# Patient Record
Sex: Female | Born: 1970
Health system: Southern US, Community
[De-identification: ages and names within clinical notes are randomized; demographics above are authoritative.]

## PROBLEM LIST (undated history)

## (undated) ENCOUNTER — Emergency Department (HOSPITAL_COMMUNITY): Payer: Self-pay

## (undated) ENCOUNTER — Emergency Department (HOSPITAL_COMMUNITY): Admission: EM | Payer: Self-pay | Source: Home / Self Care

## (undated) DIAGNOSIS — T4145XA Adverse effect of unspecified anesthetic, initial encounter: Secondary | ICD-10-CM

## (undated) DIAGNOSIS — T8859XA Other complications of anesthesia, initial encounter: Secondary | ICD-10-CM

## (undated) DIAGNOSIS — F419 Anxiety disorder, unspecified: Secondary | ICD-10-CM

## (undated) DIAGNOSIS — I341 Nonrheumatic mitral (valve) prolapse: Secondary | ICD-10-CM

## (undated) DIAGNOSIS — Z87442 Personal history of urinary calculi: Secondary | ICD-10-CM

## (undated) DIAGNOSIS — G4730 Sleep apnea, unspecified: Secondary | ICD-10-CM

## (undated) DIAGNOSIS — F988 Other specified behavioral and emotional disorders with onset usually occurring in childhood and adolescence: Secondary | ICD-10-CM

## (undated) DIAGNOSIS — I499 Cardiac arrhythmia, unspecified: Secondary | ICD-10-CM

## (undated) DIAGNOSIS — N200 Calculus of kidney: Secondary | ICD-10-CM

## (undated) DIAGNOSIS — Z8616 Personal history of COVID-19: Secondary | ICD-10-CM

## (undated) DIAGNOSIS — Z8489 Family history of other specified conditions: Secondary | ICD-10-CM

## (undated) DIAGNOSIS — K219 Gastro-esophageal reflux disease without esophagitis: Secondary | ICD-10-CM

## (undated) DIAGNOSIS — Z9889 Other specified postprocedural states: Secondary | ICD-10-CM

## (undated) DIAGNOSIS — R112 Nausea with vomiting, unspecified: Secondary | ICD-10-CM

## (undated) DIAGNOSIS — M792 Neuralgia and neuritis, unspecified: Secondary | ICD-10-CM

## (undated) DIAGNOSIS — F32A Depression, unspecified: Secondary | ICD-10-CM

## (undated) HISTORY — PX: LUMBAR DISC SURGERY: SHX700

## (undated) HISTORY — DX: Other specified behavioral and emotional disorders with onset usually occurring in childhood and adolescence: F98.8

## (undated) HISTORY — DX: Calculus of kidney: N20.0

## (undated) HISTORY — DX: Nonrheumatic mitral (valve) prolapse: I34.1

## (undated) HISTORY — PX: DILATION AND CURETTAGE OF UTERUS: SHX78

## (undated) HISTORY — DX: Anxiety disorder, unspecified: F41.9

## (undated) HISTORY — PX: TONSILLECTOMY: SUR1361

## (undated) HISTORY — DX: Gastro-esophageal reflux disease without esophagitis: K21.9

---
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
## 1997-08-28 ENCOUNTER — Inpatient Hospital Stay (HOSPITAL_COMMUNITY): Admission: AD | Admit: 1997-08-28 | Discharge: 1997-08-31 | Payer: Self-pay | Admitting: Obstetrics & Gynecology

## 1997-10-03 ENCOUNTER — Encounter (HOSPITAL_COMMUNITY): Admission: RE | Admit: 1997-10-03 | Discharge: 1998-01-01 | Payer: Self-pay | Admitting: Obstetrics & Gynecology

## 1998-10-14 ENCOUNTER — Other Ambulatory Visit: Admission: RE | Admit: 1998-10-14 | Discharge: 1998-10-14 | Payer: Self-pay | Admitting: Obstetrics & Gynecology

## 1998-11-16 ENCOUNTER — Encounter: Payer: Self-pay | Admitting: Internal Medicine

## 1998-11-16 ENCOUNTER — Ambulatory Visit (HOSPITAL_COMMUNITY): Admission: RE | Admit: 1998-11-16 | Discharge: 1998-11-16 | Payer: Self-pay | Admitting: Internal Medicine

## 2000-03-01 ENCOUNTER — Other Ambulatory Visit: Admission: RE | Admit: 2000-03-01 | Discharge: 2000-03-01 | Payer: Self-pay | Admitting: Obstetrics & Gynecology

## 2000-08-31 ENCOUNTER — Inpatient Hospital Stay (HOSPITAL_COMMUNITY): Admission: AD | Admit: 2000-08-31 | Discharge: 2000-08-31 | Payer: Self-pay | Admitting: Obstetrics and Gynecology

## 2000-11-19 ENCOUNTER — Inpatient Hospital Stay (HOSPITAL_COMMUNITY): Admission: AD | Admit: 2000-11-19 | Discharge: 2000-11-19 | Payer: Self-pay | Admitting: Obstetrics and Gynecology

## 2001-01-24 ENCOUNTER — Inpatient Hospital Stay (HOSPITAL_COMMUNITY): Admission: AD | Admit: 2001-01-24 | Discharge: 2001-01-24 | Payer: Self-pay | Admitting: Obstetrics and Gynecology

## 2001-03-25 ENCOUNTER — Inpatient Hospital Stay (HOSPITAL_COMMUNITY): Admission: AD | Admit: 2001-03-25 | Discharge: 2001-03-27 | Payer: Self-pay | Admitting: Obstetrics & Gynecology

## 2001-03-29 ENCOUNTER — Encounter: Admission: RE | Admit: 2001-03-29 | Discharge: 2001-04-28 | Payer: Self-pay | Admitting: Obstetrics and Gynecology

## 2001-05-01 ENCOUNTER — Other Ambulatory Visit: Admission: RE | Admit: 2001-05-01 | Discharge: 2001-05-01 | Payer: Self-pay | Admitting: Obstetrics and Gynecology

## 2001-10-02 ENCOUNTER — Encounter: Payer: Self-pay | Admitting: Internal Medicine

## 2001-10-02 ENCOUNTER — Encounter: Admission: RE | Admit: 2001-10-02 | Discharge: 2001-10-02 | Payer: Self-pay | Admitting: Internal Medicine

## 2003-06-13 ENCOUNTER — Other Ambulatory Visit: Admission: RE | Admit: 2003-06-13 | Discharge: 2003-06-13 | Payer: Self-pay | Admitting: Obstetrics and Gynecology

## 2003-12-07 ENCOUNTER — Inpatient Hospital Stay (HOSPITAL_COMMUNITY): Admission: AD | Admit: 2003-12-07 | Discharge: 2003-12-07 | Payer: Self-pay | Admitting: Obstetrics & Gynecology

## 2004-02-05 ENCOUNTER — Inpatient Hospital Stay (HOSPITAL_COMMUNITY): Admission: AD | Admit: 2004-02-05 | Discharge: 2004-02-06 | Payer: Self-pay | Admitting: Obstetrics and Gynecology

## 2004-02-07 ENCOUNTER — Inpatient Hospital Stay (HOSPITAL_COMMUNITY): Admission: AD | Admit: 2004-02-07 | Discharge: 2004-02-09 | Payer: Self-pay | Admitting: Obstetrics and Gynecology

## 2004-08-04 ENCOUNTER — Other Ambulatory Visit: Admission: RE | Admit: 2004-08-04 | Discharge: 2004-08-04 | Payer: Self-pay | Admitting: Obstetrics & Gynecology

## 2004-08-13 ENCOUNTER — Ambulatory Visit: Payer: Self-pay | Admitting: Internal Medicine

## 2005-06-09 ENCOUNTER — Ambulatory Visit: Payer: Self-pay | Admitting: Internal Medicine

## 2005-07-11 HISTORY — PX: CERVICAL DISCECTOMY: SHX98

## 2005-07-14 ENCOUNTER — Ambulatory Visit: Payer: Self-pay | Admitting: Internal Medicine

## 2005-08-02 ENCOUNTER — Ambulatory Visit (HOSPITAL_BASED_OUTPATIENT_CLINIC_OR_DEPARTMENT_OTHER): Admission: RE | Admit: 2005-08-02 | Discharge: 2005-08-02 | Payer: Self-pay | Admitting: Internal Medicine

## 2005-08-31 ENCOUNTER — Ambulatory Visit: Payer: Self-pay | Admitting: Pulmonary Disease

## 2005-09-28 ENCOUNTER — Ambulatory Visit: Payer: Self-pay | Admitting: Pulmonary Disease

## 2005-10-11 ENCOUNTER — Ambulatory Visit (HOSPITAL_BASED_OUTPATIENT_CLINIC_OR_DEPARTMENT_OTHER): Admission: RE | Admit: 2005-10-11 | Discharge: 2005-10-11 | Payer: Self-pay | Admitting: Pulmonary Disease

## 2005-11-04 ENCOUNTER — Ambulatory Visit: Payer: Self-pay | Admitting: Pulmonary Disease

## 2006-01-21 ENCOUNTER — Emergency Department (HOSPITAL_COMMUNITY): Admission: EM | Admit: 2006-01-21 | Discharge: 2006-01-21 | Payer: Self-pay | Admitting: Emergency Medicine

## 2006-01-22 ENCOUNTER — Ambulatory Visit (HOSPITAL_COMMUNITY): Admission: RE | Admit: 2006-01-22 | Discharge: 2006-01-22 | Payer: Self-pay | Admitting: Emergency Medicine

## 2006-01-27 ENCOUNTER — Ambulatory Visit (HOSPITAL_COMMUNITY): Admission: RE | Admit: 2006-01-27 | Discharge: 2006-01-28 | Payer: Self-pay | Admitting: Neurosurgery

## 2006-03-02 ENCOUNTER — Encounter: Admission: RE | Admit: 2006-03-02 | Discharge: 2006-03-02 | Payer: Self-pay | Admitting: Neurosurgery

## 2006-06-02 ENCOUNTER — Ambulatory Visit: Payer: Self-pay | Admitting: Internal Medicine

## 2006-06-13 ENCOUNTER — Ambulatory Visit (HOSPITAL_COMMUNITY): Admission: RE | Admit: 2006-06-13 | Discharge: 2006-06-13 | Payer: Self-pay | Admitting: Neurosurgery

## 2006-06-13 ENCOUNTER — Encounter: Admission: RE | Admit: 2006-06-13 | Discharge: 2006-06-13 | Payer: Self-pay | Admitting: Neurosurgery

## 2006-12-20 ENCOUNTER — Ambulatory Visit: Payer: Self-pay | Admitting: Internal Medicine

## 2007-01-30 ENCOUNTER — Ambulatory Visit: Payer: Self-pay | Admitting: Internal Medicine

## 2007-03-26 ENCOUNTER — Ambulatory Visit (HOSPITAL_COMMUNITY): Admission: RE | Admit: 2007-03-26 | Discharge: 2007-03-26 | Payer: Self-pay | Admitting: Neurosurgery

## 2007-06-01 ENCOUNTER — Encounter: Payer: Self-pay | Admitting: Internal Medicine

## 2007-06-01 DIAGNOSIS — I059 Rheumatic mitral valve disease, unspecified: Secondary | ICD-10-CM | POA: Insufficient documentation

## 2007-06-08 ENCOUNTER — Encounter: Payer: Self-pay | Admitting: Internal Medicine

## 2008-03-11 ENCOUNTER — Encounter: Admission: RE | Admit: 2008-03-11 | Discharge: 2008-03-11 | Payer: Self-pay | Admitting: Neurosurgery

## 2008-09-23 ENCOUNTER — Telehealth (INDEPENDENT_AMBULATORY_CARE_PROVIDER_SITE_OTHER): Payer: Self-pay | Admitting: *Deleted

## 2008-09-24 ENCOUNTER — Ambulatory Visit: Payer: Self-pay | Admitting: Internal Medicine

## 2008-10-02 ENCOUNTER — Ambulatory Visit: Payer: Self-pay | Admitting: Internal Medicine

## 2008-10-02 ENCOUNTER — Telehealth: Payer: Self-pay | Admitting: Internal Medicine

## 2008-10-02 DIAGNOSIS — R002 Palpitations: Secondary | ICD-10-CM | POA: Insufficient documentation

## 2008-10-02 DIAGNOSIS — R9431 Abnormal electrocardiogram [ECG] [EKG]: Secondary | ICD-10-CM | POA: Insufficient documentation

## 2008-10-03 ENCOUNTER — Ambulatory Visit: Payer: Self-pay

## 2008-10-03 ENCOUNTER — Telehealth: Payer: Self-pay | Admitting: Internal Medicine

## 2008-10-17 ENCOUNTER — Encounter: Payer: Self-pay | Admitting: Internal Medicine

## 2008-11-01 ENCOUNTER — Encounter: Payer: Self-pay | Admitting: Internal Medicine

## 2008-12-05 ENCOUNTER — Ambulatory Visit: Payer: Self-pay | Admitting: Internal Medicine

## 2009-01-08 ENCOUNTER — Telehealth: Payer: Self-pay | Admitting: Internal Medicine

## 2009-01-23 ENCOUNTER — Ambulatory Visit: Payer: Self-pay | Admitting: Internal Medicine

## 2009-01-23 DIAGNOSIS — H66009 Acute suppurative otitis media without spontaneous rupture of ear drum, unspecified ear: Secondary | ICD-10-CM | POA: Insufficient documentation

## 2009-02-13 ENCOUNTER — Ambulatory Visit: Payer: Self-pay | Admitting: Internal Medicine

## 2009-02-13 DIAGNOSIS — H9209 Otalgia, unspecified ear: Secondary | ICD-10-CM | POA: Insufficient documentation

## 2009-02-16 ENCOUNTER — Telehealth: Payer: Self-pay | Admitting: Internal Medicine

## 2009-02-16 ENCOUNTER — Telehealth (INDEPENDENT_AMBULATORY_CARE_PROVIDER_SITE_OTHER): Payer: Self-pay | Admitting: *Deleted

## 2009-02-17 ENCOUNTER — Encounter: Payer: Self-pay | Admitting: Internal Medicine

## 2009-08-05 ENCOUNTER — Ambulatory Visit: Payer: Self-pay | Admitting: Internal Medicine

## 2009-08-05 ENCOUNTER — Telehealth: Payer: Self-pay | Admitting: Internal Medicine

## 2009-08-05 LAB — CONVERTED CEMR LAB
Bilirubin Urine: NEGATIVE
Glucose, Urine, Semiquant: NEGATIVE
Ketones, urine, test strip: NEGATIVE
Nitrite: NEGATIVE
Protein, U semiquant: NEGATIVE
Specific Gravity, Urine: 1.02
Urobilinogen, UA: 0.2
WBC Urine, dipstick: NEGATIVE
pH: 5

## 2009-08-08 ENCOUNTER — Encounter: Payer: Self-pay | Admitting: Internal Medicine

## 2009-08-10 ENCOUNTER — Telehealth: Payer: Self-pay | Admitting: Internal Medicine

## 2009-09-09 ENCOUNTER — Ambulatory Visit: Payer: Self-pay | Admitting: Internal Medicine

## 2009-09-09 ENCOUNTER — Telehealth: Payer: Self-pay | Admitting: Internal Medicine

## 2010-05-31 ENCOUNTER — Telehealth: Payer: Self-pay | Admitting: Internal Medicine

## 2010-06-02 ENCOUNTER — Ambulatory Visit: Payer: Self-pay | Admitting: Internal Medicine

## 2010-07-15 ENCOUNTER — Encounter
Admission: RE | Admit: 2010-07-15 | Discharge: 2010-07-15 | Payer: Self-pay | Source: Home / Self Care | Attending: Neurosurgery | Admitting: Neurosurgery

## 2010-08-01 ENCOUNTER — Encounter: Payer: Self-pay | Admitting: Obstetrics and Gynecology

## 2010-08-12 NOTE — Assessment & Plan Note (Signed)
Summary: possible strep throat-norins-lb   Vital Signs:  Patient profile:   40 year old female Height:      68.5 inches Weight:      147 pounds BMI:     22.11 O2 Sat:      98 % on Room air Temp:     98.5 degrees F oral Pulse rate:   96 / minute BP sitting:   130 / 80  (left arm) Cuff size:   regular  Vitals Entered ByZella Ball Ewing (September 09, 2009 4:54 PM)  O2 Flow:  Room air CC: sore throat, right ear discomfort/RE   Primary Care Provider:  Jacques Navy MD  CC:  sore throat and right ear discomfort/RE.  History of Present Illness: here with 3 days fever and severe ST, fatigue and malaise and headache,  no sinus congestion , pain , cough and Pt denies CP, sob, doe, wheezing, orthopnea, pnd, worsening LE edema, palps, dizziness or syncope   Pt denies new neuro symptoms such as headache, facial or extremity weakness   Problems Prior to Update: 1)  Pharyngitis-acute  (ICD-462) 2)  Ear Pain, Right  (ICD-388.70) 3)  Acut Suppratv Otitis Media w/o Spont Rup Eardrum  (ICD-382.00) 4)  Electrocardiogram, Abnormal  (ICD-794.31) 5)  Palpitations, Occasional  (ICD-785.1) 6)  Mitral Valve Prolapse  (ICD-424.0)  Medications Prior to Update: 1)  Tylenol Extra Strength 500 Mg Tabs (Acetaminophen) .... As Needed For Fever 2)  Lactaid 3000 Unit Tabs (Lactase) .... As Needed When Eatting Dairy 3)  Promethazine-Codeine 6.25-10 Mg/73ml Syrp (Promethazine-Codeine) .Marland Kitchen.. 1 Tsp Q 6 For Cough 4)  Sulfamethoxazole-Trimethoprim 800-160 Mg/6ml Susp (Sulfamethoxazole-Trimethoprim) .... 20cc By Mouth Two Times A Day X 7 Days  Current Medications (verified): 1)  Tylenol Extra Strength 500 Mg Tabs (Acetaminophen) .... As Needed For Fever 2)  Lactaid 3000 Unit Tabs (Lactase) .... As Needed When Eatting Dairy 3)  Azithromycin 250 Mg Tabs (Azithromycin) .... 2po Qd For 1 Day, Then 1po Qd For 4days, Then Stop  Allergies (verified): 1)  ! Demerol 2)  ! Codeine  Past History:  Past Medical  History: Last updated: 08/05/2009 MITRAL VALVE PROLAPSE (ICD-424.0)- no 2D echo evidence   OSA  Past Surgical History: Last updated: 08/05/2009 Tonsillectomy L5-S1 ruptured disk with fragments - diskectomy '91 ([place]) Cervical diskectomy - anterior approach '07 ( Dr. 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  , mild ill  Head:  normocephalic and atraumatic.   Eyes:  vision grossly intact, pupils equal, and pupils round.   Ears:  bilat tm's red, sinus nontender Nose:  nasal dischargemucosal pallor and mucosal edema.   Mouth:  pharyngeal erythema, fair dentition, and pharyngeal exudate.   Neck:  supple and cervical lymphadenopathy.   Lungs:  normal respiratory effort and normal breath sounds.   Heart:  normal rate and regular rhythm.   Extremities:  no edema, no erythema    Impression & Recommendations:  Problem # 1:  PHARYNGITIS-ACUTE (ICD-462)  Her updated medication list for this problem includes:    Tylenol Extra Strength 500 Mg  Tabs (Acetaminophen) .Marland Kitchen... As needed for fever    Azithromycin 250 Mg Tabs (Azithromycin) .Marland Kitchen... 2po qd for 1 day, then 1po qd for 4days, then stop treat as above, f/u any worsening signs or symptoms   Complete Medication List: 1)  Tylenol Extra Strength 500 Mg Tabs (Acetaminophen) .... As needed for fever 2)  Lactaid 3000 Unit Tabs (Lactase) .... As needed when eatting dairy 3)  Azithromycin 250 Mg Tabs (Azithromycin) .... 2po qd for 1 day, then 1po qd for 4days, then stop  Patient Instructions: 1)  Please take all  new medications as prescribed  2)  Continue all previous medications as before this visit  3)  Please schedule a follow-up appointment as needed. Prescriptions: AZITHROMYCIN 250 MG TABS (AZITHROMYCIN) 2po qd for 1 day, then 1po qd for 4days, then stop  #6 x 1   Entered and Authorized by:   Corwin Levins MD   Signed by:   Corwin Levins MD on 09/09/2009   Method used:   Print then Give to Patient   RxID:   [id number]

## 2010-08-12 NOTE — Progress Notes (Signed)
Summary: rx  Phone Note Call from Patient Call back at Work Phone [phone]   Summary of Call: Patient called to inquire why pharmacy has not rec'd prescription for antibiotic from office visit this AM. RX was not listed as signed, so I signed prescription so that it can be filled for the patient. Patient notified. Initial call taken by: Lucious Groves,  August 05, 2009 3:45 PM

## 2010-08-12 NOTE — Letter (Signed)
Summary: Call A Nurse  Call A Nurse   Imported By: Sherian Rein 08/12/2009 09:48:55  _____________________________________________________________________  External Attachment:    Type:   Image     Comment:   External Document

## 2010-08-12 NOTE — Progress Notes (Signed)
Summary: TETANUS  Phone Note Call from Patient Call back at Home Phone [phone] Call back at Work Phone [phone]   Summary of Call: Patient is requesting to know date of last tetanus. She punctured her finger with an arrow. Chart ordered.  Initial call taken by: Lamar Sprinkles, CMA,  May 31, 2010 3:32 PM  Follow-up for Phone Call        Informed pt that I was waiting on chart to confirm. Advised her to keep wound clean, dry and bandaged. Call office w/redness, increased pain, drainage, odor or any concerning symptoms. She agreed. Pt feels that she had tetanus vacc in our office around 2008 but is not completely sure - Will waitin on chart.  Follow-up by: Lamar Sprinkles, CMA,  May 31, 2010 6:40 PM  Additional Follow-up for Phone Call Additional follow up Details #1::        No tetanus in the last 10 years, Pt informed, scheduled for n/v tomorrow for TDAP Additional Follow-up by: Lamar Sprinkles, CMA,  June 01, 2010 5:41 PM

## 2010-08-12 NOTE — Progress Notes (Signed)
Summary: CALL A NURSE  Phone Note From Other Clinic   Summary of Call: CALL A NURSE: Pt called saturday for office visit, c/o chest congestion & cough. No fever. Advised by RN to go to [facility] b/c sat clinic was full.  Initial call taken by: Lamar Sprinkles, CMA,  August 10, 2009 5:52 PM

## 2010-08-12 NOTE — Assessment & Plan Note (Signed)
Summary: TDAP/SD  Nurse Visit   Allergies: 1)  ! Demerol 2)  ! Codeine  Immunizations Administered:  Tetanus Vaccine:    Vaccine Type: Tdap    Site: right deltoid    Mfr: GlaxoSmithKline    Dose: 0.5 ml    Route: IM    Given by: Ami Bullins CMA    Exp. Date: 04/29/2012    Lot #: [id number]    VIS given: 05/28/08 version given June 02, 2010.  Orders Added: 1)  Tdap => 53yrs IM [90715] 2)  Admin 1st Vaccine [96295]

## 2010-08-12 NOTE — Progress Notes (Signed)
Summary: Possible strep  Phone Note Call from Patient   Summary of Call: Patient left message on triage that she would like to come in for lab or something so that she can have throat swab/culture done to check for strep. Per the patient her children have had it, and now she has sore throat, and felt like a rock in her throat. Patient was advised that an office visit is needed and transferred to schedule. Initial call taken by: Lucious Groves,  September 09, 2009 2:45 PM

## 2010-08-12 NOTE — Assessment & Plan Note (Signed)
Summary: cough,cold/#/cd   Vital Signs:  Patient profile:   40 year old female Height:      69 inches Weight:      143 pounds BMI:     21.19 O2 Sat:      96 % on Room air Temp:     98.6 degrees F oral Pulse rate:   105 / minute BP sitting:   108 / 82  (left arm) Cuff size:   regular  Vitals Entered By: Bill Salinas CMA (August 05, 2009 10:58 AM)  O2 Flow:  Room air CC: pt here with complaint of slight sore throat with aching and fever. she also c/o of lower back pain and burning when urinating/ pt had rapid strep done last night which was neg/ ab   Primary Care Provider:  Jacques Navy MD  CC:  pt here with complaint of slight sore throat with aching and fever. she also c/o of lower back pain and burning when urinating/ pt had rapid strep done last night which was neg/ ab.  History of Present Illness: Cough, right ear pain, right throat pain. Yesterday had severe myalgias and fever to 102. Tight achy chest and feels like respiratory infection is developing.  She also has had low back/flank pain and dysuria.   Current Medications (verified): 1)  Tylenol Extra Strength 500 Mg Tabs (Acetaminophen) .... As Needed For Fever 2)  Lactaid 3000 Unit Tabs (Lactase) .... As Needed When [employer]  Allergies (verified): 1)  ! Demerol 2)  ! Codeine  Past History:  Past Medical History: MITRAL VALVE PROLAPSE (ICD-424.0)- no 2D echo evidence   OSA  Past Surgical History: Tonsillectomy L5-S1 ruptured disk with fragments - diskectomy '91 ([place]) Cervical diskectomy - anterior approach '07 ( Dr. 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   Head:  normocephalic and atraumatic.   Eyes:  pupils equal, pupils round, corneas and lenses clear, and no injection.   Ears:  R ear normal and L ear normal.   Nose:  no external deformity and no external erythema.   Mouth:  no oral lesions, posterior pharynx clear Neck:  supple, full ROM, and no thyromegaly.   Lungs:  normal respiratory effort, normal breath sounds, no crackles, and no wheezes.   Heart:  normal rate, regular rhythm, and no murmur.   Abdomen:  soft, normal bowel sounds, no masses, no guarding, and no hepatomegaly.  Tender to palpation in the suprapubic region Msk:  normal ROM, no joint tenderness, no joint swelling, and no joint deformities.   Pulses:  2+ radial pulses Neurologic:  alert & oriented X3, cranial nerves II-XII intact, and gait normal.   Skin:  turgor normal, color normal, and no rashes.   Cervical Nodes:  no anterior cervical adenopathy and no posterior cervical adenopathy.   Psych:  Oriented X3, memory intact for recent and remote, and good eye contact.     Impression & Recommendations:  Problem # 1:  EAR PAIN, RIGHT (ICD-388.70) No evidence of bacterial respiratory infection. Suspect viral URI.  Plan - supportive care incluidng  promethazine/cod at bedtime.  The following medications were removed from the medication list:    Ciprodex 0.3-0.1 % Susp (Ciprofloxacin-dexamethasone) .Marland KitchenMarland KitchenMarland KitchenMarland Kitchen 4 qtts each ear two times a day x 7 days    Augmentin 250-62.5 Mg/109ml Susr (Amoxicillin-pot clavulanate) .Marland KitchenMarland KitchenMarland KitchenMarland Kitchen 10 ml by mouth three times a day for 10 days Her updated medication list for this problem includes:    Sulfamethoxazole-trimethoprim 800-160 Mg/3ml Susp (Sulfamethoxazole-trimethoprim) .Marland Kitchen... 20cc by mouth two times a day x 7 days  Problem # 2:  CYSTITIS, ACUTE (ICD-595.0) U/A with LE or bacteria but patient is tender to exam  PlaN TMP/SMX DS  as liquid two times a day x 7 days.  The following medications were removed from the medication list:    Augmentin 250-62.5 Mg/88ml Susr (Amoxicillin-pot clavulanate) .Marland KitchenMarland KitchenMarland KitchenMarland Kitchen 10 ml by mouth three times a day for 10 days Her updated medication list for this problem includes:    Sulfamethoxazole-trimethoprim 800-160 Mg/27ml Susp (Sulfamethoxazole-trimethoprim) .Marland Kitchen... 20cc by mouth two times a day x 7 days  Problem # 3:  MITRAL VALVE PROLAPSE (ICD-424.0) No hard evidence of MVR.   Complete Medication List: 1)  Tylenol Extra Strength 500 Mg Tabs (Acetaminophen) .... As needed for fever 2)  Lactaid 3000 Unit Tabs (Lactase) .... As needed when eatting dairy 3)  Promethazine-codeine 6.25-10 Mg/61ml Syrp (Promethazine-codeine) .Marland Kitchen.. 1 tsp q 6 for cough 4)  Sulfamethoxazole-trimethoprim 800-160 Mg/30ml Susp (Sulfamethoxazole-trimethoprim) .... 20cc by mouth two times a day x 7 days  Patient Instructions: 1)  Upper respiratory infection - most likely viral. Ear pain may be coming from eustachian tube dysfunction. Plan - tylenol for fever and aches; robitussin DM or the equivalent 1 tsp every 6 hours for cough; pseudoephedrine 30mg  three times a day for congestion and ear pain; see below. For night time cough - promethazine with codeine syrup - 1 or 2 tsp. 2)  Bladder infection - burning and tenderness. Plan - TMP/SMX DS as liguid 20 cc (4 tsp) two times a day for 7 days. Hydrate.  Prescriptions: SULFAMETHOXAZOLE-TRIMETHOPRIM 800-160 MG/20ML SUSP (SULFAMETHOXAZOLE-TRIMETHOPRIM) 20cc by mouth two times a day x 7 days  #280cc x 0   Entered and Authorized by:   Jacques Navy MD   Signed by:   Lucious Groves on 08/05/2009   Method used:   Electronically to        [facility]  [address] [id number] ([facility])       [address]       [place], [place]  [address]       Ph: [phone] or [phone]       Fax: [phone]   RxID:   [id number] PROMETHAZINE-CODEINE 6.25-10 MG/5ML SYRP  (PROMETHAZINE-CODEINE) 1 tsp q 6 for cough  #8 oz x 1   Entered and Authorized by:   Jacques Navy MD   Signed by:   Jacques Navy MD on 08/05/2009   Method used:   Print then Give to Patient   RxID:   [id number]   Laboratory Results   Urine Tests   Date/Time Reported: 08/05/2009 11:40am  Routine Urinalysis   Color: orange Appearance: Hazy Glucose: negative   (Normal Range: Negative) Bilirubin: negative   (Normal Range: Negative) Ketone: negative   (Normal Range: Negative) Spec. Gravity: 1.020   (Normal Range: 1.003-1.035) Blood: trace-lysed   (Normal Range: Negative) pH: 5.0   (Normal Range: 5.0-8.0) Protein: negative   (Normal Range: Negative) Urobilinogen: 0.2   (Normal Range: 0-1) Nitrite: negative   (  Normal Range: Negative) Leukocyte Esterace: negative   (Normal Range: Negative)

## 2010-08-12 NOTE — Assessment & Plan Note (Signed)
Summary: cough,congestion/men/cd   Vital Signs:  Patient profile:   40 year old female Height:      69 inches Weight:      146 pounds BMI:     21.64 O2 Sat:      99 % Temp:     97.9 degrees F oral Pulse rate:   105 / minute Pulse rhythm:   regular BP sitting:   128 / 82  (left arm) Cuff size:   regular  Vitals Entered By: Rock Nephew CMA (September 24, 2008 10:04 AM) Is Patient Diabetic? No Pain Assessment Patient in pain? no        Primary Care Provider:  Jacques Navy MD   History of Present Illness: I am seeing this pt. for the first time today as she complains of a  5 day hx. of myalgias, fever to 100, NP cough, bilateral ear popping, and post-tussive headache.  Preventive Screening-Counseling & Management     Alcohol drinks/day: 0     Smoking Status: never     Does Patient Exercise: yes      Drug Use:  no.    Problems Prior to Update: 1)  Mitral Valve Prolapse  (ICD-424.0)  Medications Prior to Update: 1)  Levbid 0.375 Mg Tb12 (Hyoscyamine Sulfate) .... Take 1 Tablet By Mouth Twice A Day 2)  Levsin/sl 0.125 Mg  Subl (Hyoscyamine Sulfate) .Marland Kitchen.. 1 By Mouth As Needed 3)  Flexeril 5 Mg  Tabs (Cyclobenzaprine Hcl) .... As Needed 4)  Multivitamins   Tabs (Multiple Vitamin) .Marland Kitchen.. 1 By Mouth Once Daily  Current Medications (verified): 1)  None  Allergies (verified): 1)  ! Demerol 2)  ! Codeine  Past History:  Risk Factors:    Alcohol Use: N/A    >5 drinks/d w/in last 3 months: N/A    Caffeine Use: N/A    Diet: N/A    Exercise: N/A  Past medical, surgical, family and social histories (including risk factors) reviewed, and no changes noted (except as noted below).  Past Medical History:    Reviewed history from 06/01/2007 and no changes required:    MITRAL VALVE PROLAPSE (ICD-424.0)       Past Surgical History:    Denies surgical history  Family History:    Reviewed history and no changes required:  Social History:    Reviewed history and no  changes required:       Married       Never Smoked       Alcohol use-no       Drug use-no       Regular exercise-yes    Drug Use:  no    Does Patient Exercise:  yes  Review of Systems       The patient complains of fever, prolonged cough, and headaches.  The patient denies anorexia, weight loss, weight gain, hoarseness, chest pain, syncope, dyspnea on exertion, peripheral edema, hemoptysis, abdominal pain, difficulty walking, depression, enlarged lymph nodes, and angioedema.    Physical Exam  General:  alert, well-developed, well-nourished, well-hydrated, appropriate dress, normal appearance, healthy-appearing, and cooperative to examination.   Ears:  External ear exam shows no significant lesions or deformities.  Otoscopic examination reveals clear canals, tympanic membranes are intact bilaterally without bulging, retraction, inflammation or discharge. Hearing is grossly normal bilaterally. Nose:  External nasal examination shows no deformity or inflammation. Nasal mucosa are pink and moist without lesions or exudates. Mouth:  Oral mucosa and oropharynx without lesions or exudates.  Teeth in good  repair. Neck:  supple, full ROM, no masses, and no thyromegaly.   Lungs:  Normal respiratory effort, chest expands symmetrically. Lungs are clear to auscultation, no crackles or wheezes. Heart:  Normal rate and regular rhythm. S1 and S2 normal without gallop, murmur, click, rub or other extra sounds. Abdomen:  soft, non-tender, normal bowel sounds, no distention, no masses, no hepatomegaly, and no splenomegaly.   Msk:  No deformity or scoliosis noted of thoracic or lumbar spine.   Pulses:  R and L carotid,radial,femoral,dorsalis pedis and posterior tibial pulses are full and equal bilaterally Extremities:  No clubbing, cyanosis, edema, or deformity noted with normal full range of motion of all joints.   Skin:  turgor normal, color normal, no rashes, no suspicious lesions, no ulcerations, and no  edema.   Cervical Nodes:  No lymphadenopathy noted Axillary Nodes:  No palpable lymphadenopathy Psych:  Cognition and judgment appear intact. Alert and cooperative with normal attention span and concentration. No apparent delusions, illusions, hallucinations   Impression & Recommendations:  Problem # 1:  BRONCHITIS-ACUTE (ICD-466.0) Assessment New  Her updated medication list for this problem includes:    Zithromax 100 Mg/45ml Recon Sus (Azithromycin) .Marland Kitchen... Take 10 ml by mouth day one, then 5 ml by mouth a day for four (4) more days    Tussionex Pennkinetic Er 8-10 Mg/64ml Lqcr (Chlorpheniramine-hydrocodone) .Marland KitchenMarland KitchenMarland KitchenMarland Kitchen 5 ml by mouth two times a day as needed cough  Complete Medication List: 1)  Zithromax 100 Mg/11ml Recon Sus (Azithromycin) .... Take 10 ml by mouth day one, then 5 ml by mouth a day for four (4) more days 2)  Tussionex Pennkinetic Er 8-10 Mg/67ml Lqcr (Chlorpheniramine-hydrocodone) .... 5 ml by mouth two times a day as needed cough  Patient Instructions: 1)  Please schedule a follow-up appointment in 2 weeks. 2)  Take your antibiotic as prescribed until ALL of it is gone, but stop if you develop a rash or swelling and contact our office as soon as possible. 3)  Acute bronchitis symptoms for less than 10 days are not helped by antibiotics. take over the counter cough medications. call if no improvment in  5-7 days, sooner if increasing cough, fever, or new symptoms( shortness of breath, chest pain). Prescriptions: TUSSIONEX PENNKINETIC ER 8-10 MG/5ML LQCR (CHLORPHENIRAMINE-HYDROCODONE) 5 ml by mouth two times a day as needed cough  #4 ozs. x 0   Entered and Authorized by:   Etta Grandchild MD   Signed by:   Etta Grandchild MD on 09/24/2008   Method used:   Print then Give to Patient   RxID:   [id number] ZITHROMAX 100 MG/5ML RECON SUS (AZITHROMYCIN) Take 10 ml by mouth day one, then 5 ml by mouth a day for four (4) more days  #30 ml x 0   Entered and Authorized by:   Etta Grandchild MD   Signed by:   Etta Grandchild MD on 09/24/2008   Method used:   Print then Give to Patient   RxID:   [id number]

## 2010-11-26 NOTE — Op Note (Signed)
NAME:  Shannon Swanson, Shannon Swanson NO.:  [id number]   MEDICAL RECORD NO.:  [id number]          PATIENT TYPE:  OIB   LOCATION:  3010                         FACILITY:  [facility]   PHYSICIAN:  Donalee Citrin, M.D.        DATE OF BIRTH:  [birth year]   DATE OF PROCEDURE:  01/27/2006  DATE OF DISCHARGE:                                 OPERATIVE REPORT   PREOPERATIVE DIAGNOSES:  Left C6 radiculopathy from __________ C5-6 level.   PROCEDURE:  Anterior cervical diskectomy and fusion C5-6 using a 6 mm ACS  graft and a 23 mm Atlantis plate with two 13 and two 12 mm __________  screws.   SURGEON:  Donalee Citrin, M.D.   Threasa HeadsYetta Barre.   ANESTHESIA:  General endotracheal.   HISTORY OF PRESENT ILLNESS:  The patient is a very pleasant, 40 year old  female who has had progressive, unrelenting neck and left arm pain that has  gotten progressively worse over the last several weeks and months.  She had  weakness of her triceps, inability to use her arm and numbness in a C6  distribution.  A preoperative EMG showed a very large disk herniation at C5-  6 compressing on the spinal cord and left C6 nerve root.  Due to the  patient's preoperative weakness and findings on physical exam and MRI, the  patient is recommended to have cervical diskectomy and fusion.  Risks are  explained to the patient, and she understands and agreed to proceed forward.   The patient was brought to the OR was induced under general anesthesia,  positioned supine with the neck in extension with 5 pounds halter traction.  The right side of the neck was prepped and draped in the usual sterile  fashion.  Preoperative x-rays localized to C6 vertebral body.  A curvilinear  incision was made just off the midline to the anterior border of  sternocleidomastoid.  The superficial layer of fascia was dissected out and  divided longitudinally.  The avascular __________ sternocleidomastoid strap  muscle was divided __________.   Intraoperative x-ray confirmed mobilization  to the appropriate level.  Annulotomy was then obtained with scalpel to mark  the disk space and the longus coli was reflected laterally.  Then, a self-  retaining retractor was placed and the annulotomy was extended.  Then,  pituitary rongeurs were used to clean out the disk space, then using a  pituitary and Kerrison punch, the anterior osteophyte of the C5 vertebral  body was underbitten and a high speed drill was used to drill out the  posterior annulus and the posterior osteophyte complex.  At this point, the  operating microscope was draped, brought into the field and under  microscopic illumination, the undersurface of the endplates at C5 and C6  were underbitten.  This exposed the __________ which removed in a piecemeal  fashion, which exposed several very large fragments of disk compressing the  spinal cord and proximal left C6 nerve root that had migrated subligamentous  and displaced the spinal cord dorsally.  These were teased away with a black  nerve hook and removed  with pituitary rongeurs.  This decompressed the  thecal sac immediately.  The foraminotomy was extended.  The C6 nerve root  on the left side was identified and skeletonized significantly proximally  and distally.  Then, a pedicle at C6 was also palpated and identified.  Then, the undersurface of the end plates was underbitten, marching across  the right side.  There was a reasonably-sized osteophyte at the posterior  aspect of the C5 vertebral body still compressing the spinal cord.  This was  all underbitten and the proximal C6 nerve root on the right was also  identified and skeletonized.  After the thecal sac had resumed normal  anatomic location, the would was copiously irrigated and meticulous  hemostasis was maintained.  The endplates were scraped with a BA curette and  a 6 mm allograft wedge was inserted 1 mm deep to the anterior vertebral  line. A 23 mm Atlantis  Vision plate was inserted.  Two 13 mm __________  screws were placed.  Fluoroscopy confirmed good position of plate, screws  and bone graft.  The 13-mm screws were noted to be abutting the posterior  cortical surface and so the next two screws put in were 12s.  Then, the  locking mechanism was engaged.  The wound was copiously irrigated and  meticulous hemostasis was maintained.  The platysma was reapproximated with  3-0 interrupted Vicryl and the skin was closed with running 4-0  subcuticular.  Benzoin and Steri-Strips were applied and the patient went to  the recovery in stable condition.  At the end of the case, needle count and  sponge count were correct.           ______________________________  Donalee Citrin, M.D.     GC/MEDQ  D:  01/27/2006  T:  01/27/2006  Job:  [id number]

## 2010-11-26 NOTE — Procedures (Signed)
NAME:  Shannon Swanson, Shannon Swanson                 ACCOUNT NO.:  0   MEDICAL RECORD NO.:  [id number]           PATIENT TYPE:   LOCATION:  SLEEP CENTER                   FACILITY:   PHYSICIAN:  Coralyn Helling, M.D.      DATE OF BIRTH:  21-Apr-1971   DATE OF STUDY:                            MULTIPLE SLEEP LATENCY TEST   DATE OF STUDY:  10/12/2005   INDICATION FOR STUDY:  This is an individual who has complaints of excessive  and daytime sleepiness.  She had undergone an overnight polysomnogram on  October 11, 2005, which documented greater than 6 hours of total sleep time and  she is now to undergo multiple sleep latency testing.   EPWORTH SLEEPINESS SCORE:  21   BMI:  20.9   MEDICATIONS:  Levsin, which she did not take on this date.   Nap Times:              Sleep Latency:                REM Latency:  1)  0800                      3.5 minutes                   No sleep onset  REM  2)  1000                      3.5 minutes                   No sleep onset  REM  3)  1200                      1.5 minutes                   No sleep onset  REM  4)  1400                      7 minutes                     No sleep onset  REM  5)  1600                      9 minutes                     No sleep onset  REM    MEAN SLEEP LATENCY:  4.9 minutes   NUMBER OF REM EPISODES:  0   IMPRESSIONS-RECOMMENDATIONS:  This study shows evidence for a significant  hypersomnolence and correlation should be made with the patient's prior  overnight polysomnogram.      Coralyn Helling, M.D.  Diplomat, Biomedical engineer of Sleep Medicine  Electronically Signed     VS/MEDQ  D:  11/03/2005 12:30:55  T:  11/04/2005 07:48:31  Job:  [id number]

## 2010-11-26 NOTE — Procedures (Signed)
NAME:  Shannon Swanson, Shannon Swanson NO.:  [id number]   MEDICAL RECORD NO.:  [id number]          PATIENT TYPE:  OUT   LOCATION:  SLEEP CENTER                 FACILITY:  [facility]   PHYSICIAN:  Coralyn Helling, M.D.      DATE OF BIRTH:  1970-12-29   DATE OF STUDY:  08/02/2005                              NOCTURNAL POLYSOMNOGRAM   DATE OF STUDY:  August 02, 2005.   REFERRING PHYSICIAN:  Rosalyn Gess. Norins, M.D. [facility]   INDICATION FOR STUDY:  Patient waking up fatigued, with a choking sensation,  feeling something is blocking her throat, and sleepy during the day. She is  referred to the sleep lab for evaluation of sleep disordered breathing.   EPWORTH SLEEPINESS SCORE:  18.   MEDICATIONS:  Levsin.   SLEEP ARCHITECTURE:  Total recording time is 439 minutes. Total sleep time  was 418.5 minutes.  Sleep efficiency was 95%. The patient was observed in  all stages of sleep, although there was a relative reduction in the  percentage of REM sleep and the slow wave sleep, and increase in percentage  of stage II sleep. Sleep latency was 14 minutes, which was within normal  limits. REM latency was 96 minutes, which was within normal limits. The  patient was observed in both supine and non-supine position. Also of note,  an alpha/delta pattern on EEG and there was a brief period of delta  hypersynchrony at sleep onset.   RESPIRATORY DATA:  The apnea/hypopnea index was 3.2 with an apnea/hypopnea  index during REM sleep of 8.9, and mild to moderate snoring was noted by the  technician. There was truncation of the air flow.   OXYGEN DATA:  The oxygen saturation nadir was 92%. The patient spent the  entire study with an oxygen saturation between 91% to 100%.   CARDIAC DATA:  EKG showed normal sinus rhythm with occasional APCs.   MOVEMENT-PARASOMNIA:  Periodic movement index was 10.9 which is normal. No  other abnormal movements were noted.   IMPRESSIONS-RECOMMENDATIONS:  This study does not  show evidence for sleep  disordered breathing as the apnea/hypopnea index was for the entire study  was 3.2, with an oxygen saturation nadir of 92%. However, the patient did  have predominance of respiratory events during REM sleep and may therefore  have REM-related sleep disordered breathing. Additionally, she had  truncation of her air flow associated with occasional arousals. While these  events were not scoreable based on typical criteria, they may be reflective  of upper airway resistance syndrome and if clinically warranted the patient  may benefit from further therapy such as CPAP therapy for possible upper  airway resistance syndrome. Of additional note is that she had an  alpha/delta pattern during slow wave sleep. While this can be a normal  finding, it has also been associated with other conditions such as  fibromyalgia and clinical correlation would be necessary. Additionally, she  had a brief period of delta hypersynchrony with sleep onset. Again this can  be a normal finding. However, clinical correlation would be necessary for  any underlying neurologic disorder.      Coralyn Helling, M.D.  Diplomat, [employer]  Board of Sleep [employer]  Electronically Signed     VS/MEDQ  D:  08/31/2005 17:34:20  T:  09/01/2005 15:07:48  Job:  [id number]

## 2010-11-26 NOTE — Procedures (Signed)
NAME:  Shannon Swanson, Shannon Swanson NO.:  [id number]   MEDICAL RECORD NO.:  [id number]          PATIENT TYPE:  OUT   LOCATION:  SLEEP CENTER                 FACILITY:  [facility]   PHYSICIAN:  Coralyn Helling, M.D.      DATE OF BIRTH:  07-11-1971   DATE OF STUDY:  10/11/2005                            MULTIPLE SLEEP LATENCY TEST   INDICATION FOR STUDY:  This is an individual who has complaints of excess in  daytime sleepiness.  She had undergone a overnight polysomnogram on October 11, 2005, which documented greater than 6 hours of total sleep time and she is  now to undergo multiple sleep latency testing.   EPWORTH SLEEPINESS SCORE:  21   BMI:  20.9   MEDICATIONS:  Levsin which she did not take on this day.   Nap Times:              Sleep Latency:                REM Latency:  1)  0800                      3.5 minutes                   No sleep onset  REM  2)  1000                      3.5 minutes                   No sleep onset  REM  3)  1200                      1.5 minutes                   No sleep onset  REM  4)  1400                      7 minutes                     No sleep onset  REM  5)  1600                      9 minutes                     No sleep onset  REM    MEAN SLEEP LATENCY:  4.9 minutes   NUMBER OF REM EPISODES:  0   COMMENTS:   IMPRESSIONS-RECOMMENDATIONS:  This study shows evidence for significant  hypersomnolence and correlation should be made with the patient's prior  overnight polysomnogram.      Coralyn Helling, M.D.  Diplomat, Biomedical engineer of Sleep Medicine  Electronically Signed     VS/MEDQ  D:  10/31/2005 20:14:48  T:  11/01/2005 20:01:27  Job:  [id number]

## 2010-11-26 NOTE — Procedures (Signed)
NAME:  Shannon Swanson, VEST NO.:  [id number]   MEDICAL RECORD NO.:  [id number]          PATIENT TYPE:  OUT   LOCATION:  SLEEP CENTER                 FACILITY:  [facility]   PHYSICIAN:  Coralyn Helling, M.D.      DATE OF BIRTH:  1970/11/23   DATE OF STUDY:  10/11/2005                              NOCTURNAL POLYSOMNOGRAM   INDICATION FOR STUDY:  This is an individual who has excessive daytime  sleepiness symptoms of waking up choking. She had undergone a previous  polysomnogram which showed evidence for a snore crescendo arousal as well as  delta hypersynchrony  and she returns to the sleep lab to undergo a repeat  polysomnogram to be followed by a multiple sleep latency test to document  degree of sleepiness.   EPWORTH SLEEPINESS SCORE:  21.   MEDICATIONS:  Levsin.   SLEEP ARCHITECTURE:  Total recording time was 449.5 minutes, total sleep  time was 407.5 minutes. Sleep efficiency was 91%. The patient was observed  in all stages of sleep. Sleep latency was 21 minutes, REM latency was 62.5  minutes which is slightly reduced. The patient was observed both in the  supine and nonsupine position.   RESPIRATORY DATA:  The apnea-hypopnea index was 1.9. The events were  exclusively obstructive in nature. The supine apnea-hypopnea index was 3.6  and the REM apnea-hypopnea index was 5 and mild snoring was noted by the  technician. Also noted was snore crescendo arousals with truncation of the  waveform on the pressure transducer.  On epics 54, 552 and 725, there were  abnormal EEG wave patterns seen both at 10 second and 30 second speed more  prominent in C4 to A1 and O2 to A1 in all three instances which were not  typical for spike and waveform but again had an unusual morphology although  there was no abnormal activity noted on video monitoring or by the  technician during those epics.   OXYGEN DATA:  The oxygen saturation nadir was 90%. The patient spent the  entire study with an  oxygen saturation between 90 and 100%. The baseline  oxygen level was 98%.   CARDIAC DATA:  EKG showed normal sinus rhythm with an average heart rate of  68.   MOVEMENT-PARASOMNIA:  The periodic limb movement is 5.3.   IMPRESSIONS-RECOMMENDATIONS:  This study shows evidence for possible REM-  related sleep apnea with a REM apnea-hypopnea index of 5. Additionally she  had evidence for snore crescendo arousals. Further treatment for her sleep  disorder breathing may be warranted particularly in light of the significant  symptoms of daytime sleepiness that the patient displays.  Additionally,  there were unusual waveforms noted on EEG as stated above. While again this  does not appear to be typical for spike and waveform activity related to  seizure activity, further evaluation may be warranted such as having the  patient undergo a sleep deprived EEG, although it may be best to defer this  until  after her possible sleep disorder breathing is further treated, and then if  she does still have symptoms of sleep difficulties or daytime somnolence  then further investigations of this  may be warranted.      Coralyn Helling, M.D.  Diplomat, Biomedical engineer of Sleep Medicine  Electronically Signed     VS/MEDQ  D:  10/31/2005 19:58:39  T:  11/01/2005 19:40:04  Job:  [id number]

## 2010-11-29 ENCOUNTER — Telehealth: Payer: Self-pay | Admitting: *Deleted

## 2010-11-29 NOTE — Telephone Encounter (Signed)
1. Needs copy of TDAP 2. Pt is going on mission trip. She says her ears do not "pop" while on airplanes and this causes nausea and vomiting. Patient requesting advisement from MD.

## 2010-11-29 NOTE — Telephone Encounter (Signed)
Take sudafed 30mg  30 min before flying. If flight is more than 3 hrs take a second sudafed 30 min before landing.

## 2010-11-29 NOTE — Telephone Encounter (Signed)
Patient informed. 

## 2010-12-06 ENCOUNTER — Inpatient Hospital Stay (INDEPENDENT_AMBULATORY_CARE_PROVIDER_SITE_OTHER)
Admission: RE | Admit: 2010-12-06 | Discharge: 2010-12-06 | Disposition: A | Discharge status: Home / Self Care | Payer: BC Managed Care – PPO | Source: Ambulatory Visit | Attending: Emergency Medicine | Admitting: Emergency Medicine

## 2010-12-06 DIAGNOSIS — J309 Allergic rhinitis, unspecified: Secondary | ICD-10-CM

## 2010-12-07 ENCOUNTER — Telehealth: Payer: Self-pay

## 2010-12-07 NOTE — Telephone Encounter (Signed)
[facility] Triage Call Report Triage Record Num: [id number] Operator: Lesli Albee Patient Name: Shannon Swanson Call Date & Time: 12/06/2010 10:35:58AM Patient Phone: [phone] PCP: Illene Regulus Patient Gender: Female PCP Fax : [phone] Patient DOB: March 02, 1971 Practice Name: [facility] Reason for Call: Pt is calling about having a sore throat. Pt flies out on a mission trip tomorrow and wants to make sure she doesn't need tx. Pt is also c/o sore ear and swelling under her ear. Pt is afebrile. Pt advised U/C. Pt will go to [facility]. Protocol(s) Used: Sore Throat or Hoarseness Recommended Outcome per Protocol: See Provider within 24 hours Reason for Outcome: New onset of painful, swollen glands on sides of neck or under jaw Care Advice: Apply warm, moist soaks or compresses to the affected area for 20-30 minutes 3 to 4 times per day. Avoid burning skin by using water no hotter than bath water and by not lying on the compresses. ~ ~ SYMPTOM / CONDITION MANAGEMENT Analgesic/Antipyretic Advice - Acetaminophen: Consider acetaminophen as directed on label or by pharmacist/provider for pain or fever PRECAUTIONS: - Use if there is no history of liver disease, alcoholism, or intake of three or more alcohol drinks per day - Only if approved by provider during pregnancy or when breastfeeding - During pregnancy, acetaminophen should not be taken more than 3 consecutive days without telling provider - Do not exceed recommended dose or frequency ~ Sore Throat Relief: - Use warm salt water gargles 3 to 4 times/day, as needed (1/2 tsp. salt in 8 oz. [.2 liters] water). - Suck on hard candy, nonprescription or herbal throat lozenges (sugar-free if diabetic) - Eat soothing, soft food/fluids (broths, soups, or honey and lemon juice in hot tea, Popsicles, frozen yogurt or sherbet, scrambled eggs, cooked cereals, Jell-O or puddings) whichever is most comforting. - Avoid eating  salty, spicy or acidic foods. ~ If pregnancy is known or suspected, get advice from provider before using any nonprescription medication other than acetaminophen ~ 12/06/2010 10:46:01AM Page 1 of 1 [email or web address]

## 2011-05-12 ENCOUNTER — Other Ambulatory Visit: Payer: Self-pay | Admitting: Neurosurgery

## 2011-05-12 ENCOUNTER — Ambulatory Visit
Admission: RE | Admit: 2011-05-12 | Discharge: 2011-05-12 | Disposition: A | Discharge status: Home / Self Care | Payer: BC Managed Care – PPO | Source: Ambulatory Visit | Attending: Neurosurgery | Admitting: Neurosurgery

## 2011-05-12 DIAGNOSIS — M47817 Spondylosis without myelopathy or radiculopathy, lumbosacral region: Secondary | ICD-10-CM

## 2011-09-14 ENCOUNTER — Telehealth: Payer: Self-pay | Admitting: Internal Medicine

## 2011-09-14 NOTE — Telephone Encounter (Signed)
Received copies from [facility] ,on 09/14/11 . Forwarded 3 pages to Dr. ,for review.

## 2012-09-04 ENCOUNTER — Other Ambulatory Visit (INDEPENDENT_AMBULATORY_CARE_PROVIDER_SITE_OTHER): Payer: BC Managed Care – PPO

## 2012-09-04 ENCOUNTER — Encounter: Payer: Self-pay | Admitting: Internal Medicine

## 2012-09-04 ENCOUNTER — Ambulatory Visit (INDEPENDENT_AMBULATORY_CARE_PROVIDER_SITE_OTHER): Payer: BC Managed Care – PPO | Admitting: Internal Medicine

## 2012-09-04 ENCOUNTER — Other Ambulatory Visit: Payer: BC Managed Care – PPO

## 2012-09-04 VITALS — BP 116/80 | HR 90 | Temp 98.7°F | Resp 10 | Wt 149.0 lb

## 2012-09-04 DIAGNOSIS — R1031 Right lower quadrant pain: Secondary | ICD-10-CM

## 2012-09-04 LAB — URINALYSIS, ROUTINE W REFLEX MICROSCOPIC
Bilirubin Urine: NEGATIVE
Hgb urine dipstick: NEGATIVE
Ketones, ur: NEGATIVE
Leukocytes, UA: NEGATIVE
Nitrite: NEGATIVE
Specific Gravity, Urine: 1.02 (ref 1.000–1.030)
Total Protein, Urine: NEGATIVE
Urine Glucose: NEGATIVE
Urobilinogen, UA: 0.2 (ref 0.0–1.0)
pH: 7 (ref 5.0–8.0)

## 2012-09-04 LAB — POCT URINALYSIS DIPSTICK
Bilirubin, UA: NEGATIVE
Blood, UA: NEGATIVE
Glucose, UA: NEGATIVE
Ketones, UA: NEGATIVE
Leukocytes, UA: NEGATIVE
Nitrite, UA: NEGATIVE
Protein, UA: NEGATIVE
Spec Grav, UA: 1.01
Urobilinogen, UA: 0.2
pH, UA: 7.5

## 2012-09-04 MED ORDER — KETOROLAC TROMETHAMINE 10 MG PO TABS: 10.0000 mg | ORAL_TABLET | Freq: Four times a day (QID) | ORAL | Status: DC | PRN

## 2012-09-04 NOTE — Patient Instructions (Addendum)
Flank and sacroiliac pain - normal abdominal exam.   Plan -  will check a microscopic urinalysis. If there is blood will get CT with kidney stone protocol. If no blood will treat the pain with ketorolac. For persistent pain the next step will be a renal ultrasound.

## 2012-09-04 NOTE — Progress Notes (Signed)
  Subjective:    Patient ID: Shannon Swanson, female    DOB: 03/04/1971, 42 y.o.   MRN: [id number]  HPI Ms. Sites for 7-10 days of urgency, bad abdominal pain in the lower abdomen and suprapubic region. This got better and then she started having flank pain and pain in the right buttock/sacroiliac join. No pain that radiates down the leg. The pain limits her mobility. The flank pain is localized and has not radiated to the anterior abdomen. The pain can wax and wane.  No fever or chills. Had nausea this AM when she woke up at 0400 - something woke her up, she felt very nauseated, got up and felt presyncopal. She lay down on the floor until she could recover.   Past Medical History: MITRAL VALVE PROLAPSE (ICD-424.0)- no 2D echo evidence   OSA  Past Surgical History: Tonsillectomy L5-S1 ruptured disk with fragments - diskectomy '91 ([place]) Cervical diskectomy - anterior approach '07 ( Dr. 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     Objective:   Physical Exam Filed Vitals:   09/04/12 1049  BP: 116/80  Pulse: 90  Temp: 98.7 F (37.1 C)  Resp: 10   Gen'l- WNWD white woman with a short waste, in no distress HEENT- C&S clear, PERRLA Cor- 2+ radial, RRR Pulm - normal respirations Abd - BS + x 4 quadrants, no HSM, no guarding or rebound, no masses, no palpable liver edge, minimal tenderness to deep palpation in the lower right quadrant, no palpable kidney and no tenderness at the level of the umbilicus. MSK - no deformity. No CVAT  tenderness. Question of tenderness at the right SI joint. Tender to percussion over the low right flank  U/A dip negative, lab confirmed and no trace of blood       Assessment & Plan:  Low back pain - unusual presentation of severe pain earlier this AM but in no distress at exam. No marked tenderness illicited. No microhematuria. Question of SI inflammation or occult abdominal source of pain.  Plan Ketorolac 10 mg q 8 prn, #20 provided  For persistent pain next step will be Renal U/S since pain in at the flank.

## 2012-09-05 ENCOUNTER — Encounter: Payer: Self-pay | Admitting: Internal Medicine

## 2012-09-06 ENCOUNTER — Telehealth: Payer: Self-pay | Admitting: *Deleted

## 2012-09-06 NOTE — Telephone Encounter (Signed)
Called pt and and LVM to check on pt. Asked her if pain any better with toradol. Told her in message that the U/A from the lab was negative. Please call for persistent/recurrent pain.discussed next step will be renal/abdominal U/S. [place], CMA.

## 2012-09-07 ENCOUNTER — Telehealth: Payer: Self-pay | Admitting: *Deleted

## 2012-09-12 NOTE — Telephone Encounter (Signed)
Opened encounter in error  

## 2013-10-03 ENCOUNTER — Encounter: Payer: Self-pay | Admitting: Internal Medicine

## 2013-10-03 ENCOUNTER — Ambulatory Visit (INDEPENDENT_AMBULATORY_CARE_PROVIDER_SITE_OTHER): Payer: 59 | Admitting: Internal Medicine

## 2013-10-03 ENCOUNTER — Telehealth: Payer: Self-pay

## 2013-10-03 ENCOUNTER — Other Ambulatory Visit (INDEPENDENT_AMBULATORY_CARE_PROVIDER_SITE_OTHER): Payer: 59

## 2013-10-03 VITALS — BP 108/78 | HR 71 | Temp 98.7°F | Ht 68.0 in | Wt 157.0 lb

## 2013-10-03 DIAGNOSIS — Z0000 Encounter for general adult medical examination without abnormal findings: Secondary | ICD-10-CM

## 2013-10-03 DIAGNOSIS — Z23 Encounter for immunization: Secondary | ICD-10-CM

## 2013-10-03 LAB — BASIC METABOLIC PANEL
BUN: 9 mg/dL (ref 6–23)
CO2: 30 mEq/L (ref 19–32)
Calcium: 9.5 mg/dL (ref 8.4–10.5)
Chloride: 103 mEq/L (ref 96–112)
Creatinine, Ser: 0.9 mg/dL (ref 0.4–1.2)
GFR: 72.07 mL/min (ref >60.00)
Glucose, Bld: 99 mg/dL (ref 70–99)
Potassium: 4 mEq/L (ref 3.5–5.1)
Sodium: 140 mEq/L (ref 135–145)

## 2013-10-03 LAB — LIPID PANEL
Cholesterol: 179 mg/dL (ref 0–200)
HDL: 52.4 mg/dL (ref >39.00)
LDL Cholesterol: 115 mg/dL — ABNORMAL HIGH (ref 0–99)
Total CHOL/HDL Ratio: 3
Triglycerides: 58 mg/dL (ref 0.0–149.0)
VLDL: 11.6 mg/dL (ref 0.0–40.0)

## 2013-10-03 LAB — HEMOGLOBIN AND HEMATOCRIT, BLOOD
HCT: 42.3 % (ref 36.0–46.0)
Hemoglobin: 14.3 g/dL (ref 12.0–15.0)

## 2013-10-03 NOTE — Telephone Encounter (Signed)
The new Murlean Hark in Sept.

## 2013-10-03 NOTE — Telephone Encounter (Signed)
Patient would like to know who you recommend she see for new PCP? Please advise. She states she would like to stay here at this office.

## 2013-10-03 NOTE — Patient Instructions (Signed)
Great to see you. It is fabulous that you are going back to school.  Your exam is normal today. Lab ordered include the TB Quantiferon gold and Varicella titre as well as routine sugar and cholesterol. Results will be posted to [facility].  Good luck!!!

## 2013-10-03 NOTE — Progress Notes (Signed)
Subjective:    Patient ID: Shannon Swanson, female    DOB: [birth year], 43 y.o.   MRN: [id number]  HPI Shannon Swanson presents for general wellness related to clearance for nursing school. She reports that she has been well and has no active medical complaints at today's visit.   History reviewed. No pertinent past medical history. Past Surgical History  Procedure Laterality Date  . Tonsillectomy    . Cervical discectomy  2007    anterior approach 2007 Dr Saintclair Halsted    Family History  Problem Relation Age of Onset  . Diabetes Mother   . Mitral valve prolapse Mother   . Arthritis Mother   . Emphysema Father    History   Social History  . Marital Status: Married    Spouse Name: N/A    Number of Children: N/A  . Years of Education: N/A   Occupational History  . Not on file.   Social History Main Topics  . Smoking status: Never Smoker   . Smokeless tobacco: Never Used  . Alcohol Use: No  . Drug Use: No  . Sexual Activity: Yes    Partners: Male   Other Topics Concern  . 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 calcium carbonate (TUMS - DOSED IN MG ELEMENTAL CALCIUM) 500 MG chewable tablet Chew 1 tablet by mouth daily.      Marland Kitchen lactase (LACTAID) 3000 UNITS tablet Take 1 tablet by mouth 3 (three) times daily with meals.       No current facility-administered medications on file prior to visit.      Review of Systems Constitutional:  Negative for fever, chills, activity change and unexpected weight change.  HEENT:  Negative for hearing loss, ear pain, congestion, neck stiffness and postnasal drip. Negative for sore throat or swallowing problems. Negative for dental complaints.   Eyes: Negative for vision loss or change  in visual acuity.  Respiratory: Negative for chest tightness and wheezing. Negative for DOE.   Cardiovascular: Negative for chest pain or palpitations. No decreased exercise tolerance Gastrointestinal: No change in bowel habit. No bloating or gas. No reflux or indigestion Genitourinary: Negative for urgency, frequency, flank pain and difficulty urinating.  Musculoskeletal: Negative for myalgias, back pain, arthralgias and gait problem.  Neurological: Negative for dizziness, tremors, weakness and headaches.  Hematological: Negative for adenopathy.  Psychiatric/Behavioral: Negative for behavioral problems and dysphoric mood.       Objective:   Physical Exam Filed Vitals:   10/03/13 0829  BP: 108/78  Pulse: 71  Temp: 98.7 F (37.1 C)   Wt Readings from Last 3 Encounters:  10/03/13 157 lb (71.215 kg)  09/04/12 149 lb (67.586 kg)  09/09/09 147 lb (66.679 kg)   Body mass index is 23.52 kg/(m^2).  Gen'l  WNWD woman in no distress HEENT - C&S clear, PERRLA, TMs, normal, oropharynx w/o lesions Neck - supple, w/o thyromegaly Nodes - negative Cor - 2+ radial, 1+ DP pulse  RRR, no JVD, no carotid bruits Pulm - CTAP Breast - deferred to gyn Abd - soft, w/o HSM, no guarding or rebound Pelvic/rectal - deferred to gyn Ext - no deformity Neuro - non-focal Derm - healing excisoin  site left scapul and left of mid-line T2  Recent Results (from the past 2160 hour(s))  BASIC METABOLIC PANEL     Status: None   Collection Time    10/03/13  9:49 AM      Result Value Ref Range   Sodium 140  135 - 145 mEq/L   Potassium 4.0  3.5 - 5.1 mEq/L   Chloride 103  96 - 112 mEq/L   CO2 30  19 - 32 mEq/L   Glucose, Bld 99  70 - 99 mg/dL   BUN 9  6 - 23 mg/dL   Creatinine, Ser 0.9  0.4 - 1.2 mg/dL   Calcium 9.5  8.4 - 10.5 mg/dL   GFR 72.07  >60.00 mL/min  LIPID PANEL     Status: Abnormal   Collection Time    10/03/13  9:49 AM      Result Value Ref Range   Cholesterol 179  0 - 200 mg/dL    Comment: ATP III Classification       Desirable:  < 200 mg/dL               Borderline High:  200 - 239 mg/dL          High:  > = 240 mg/dL   Triglycerides 58.0  0.0 - 149.0 mg/dL   Comment: Normal:  <150 mg/dLBorderline High:  150 - 199 mg/dL   HDL 52.40  >39.00 mg/dL   VLDL 11.6  0.0 - 40.0 mg/dL   LDL Cholesterol 115 (*) 0 - 99 mg/dL   Total CHOL/HDL Ratio 3     Comment:                Men          Women1/2 Average Risk     3.4          3.3Average Risk          5.0          4.42X Average Risk          9.6          7.13X Average Risk          15.0          11.0                      HEMOGLOBIN AND HEMATOCRIT, BLOOD     Status: None   Collection Time    10/03/13  9:49 AM      Result Value Ref Range   Hemoglobin 14.3  12.0 - 15.0 g/dL   HCT 42.3  36.0 - 46.0 %  VARICELLA ZOSTER ABS, IGG/IGM     Status: None   Collection Time    10/03/13 10:10 AM      Result Value Ref Range   VARICELLA ZOSTER IGG 788  Immune >165 index   Comment:                                Negative          <135                                    Equivocal    135 - 165  Positive          >165     A positive result generally indicates exposure to the     pathogen or administration of specific immunoglobulins,     but it is not indication of active infection or stage     of disease.   Varicella IgM <0.91  0.00 - 0.90 index   Comment:                                Negative          <0.91                                    Borderline  0.91 - 1.09                                    Positive          >1.09       Assessment & Plan:

## 2013-10-03 NOTE — Progress Notes (Signed)
For patients eye exam she wears contact lenses. In the left eye she wears the contact lens for far vision and the right she wears it for near vision.

## 2013-10-03 NOTE — Progress Notes (Signed)
Pre visit review using our clinic review tool, if applicable. No additional management support is needed unless otherwise documented below in the visit note. 

## 2013-10-04 ENCOUNTER — Encounter: Payer: Self-pay | Admitting: Internal Medicine

## 2013-10-04 LAB — VARICELLA ZOSTER ABS, IGG/IGM
Varicella IgM: <0.91 index (ref 0.00–0.90)
Varicella zoster IgG: 788 index (ref >165)

## 2013-10-07 DIAGNOSIS — Z0000 Encounter for general adult medical examination without abnormal findings: Secondary | ICD-10-CM | POA: Insufficient documentation

## 2013-10-07 NOTE — Assessment & Plan Note (Signed)
Interval history - no illness, surgery or injury. Limited exam, sans breast and pelvic normal. Lab reviewed - normal including glucose and LDL that is better than goal of 130 or less (NCEP ATP-III).  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

## 2013-10-08 LAB — QUANTIFERON TB GOLD ASSAY (BLOOD)
Interferon Gamma Release Assay: NEGATIVE
Mitogen value: 2.44 IU/mL
Quantiferon Nil Value: 0.02 IU/mL
Quantiferon Tb Ag Minus Nil Value: 0 IU/mL
TB Ag value: 0.02 IU/mL

## 2013-11-01 ENCOUNTER — Telehealth: Payer: Self-pay | Admitting: Internal Medicine

## 2013-11-01 ENCOUNTER — Telehealth: Payer: Self-pay | Admitting: Certified Registered Nurse Anesthetist

## 2013-11-01 NOTE — Telephone Encounter (Signed)
Patient states she received the Measles vaccine in [place].  She also states she received the Rubella vaccine in 1979.  She received 1 MMR vaccine on 10/03/13.  Her school is requesting that she receive a second MMR vaccine.  Dr. Alain Marion was informed of the patient's vaccine history and the school request for second MMR.  I advised patient, per Dr. Alain Marion to receive a second MMR injection.

## 2013-11-01 NOTE — Telephone Encounter (Signed)
Patient had an MMR inj on 10/03/2013 and states that her school is requiring her to have an MMR #2.  She wants to know when the earliest date is when she can have the 2nd injection. Please advise.

## 2013-11-04 NOTE — Telephone Encounter (Signed)
Erroneous Encounter

## 2013-11-09 ENCOUNTER — Encounter: Payer: Self-pay | Admitting: Family Medicine

## 2013-11-09 ENCOUNTER — Ambulatory Visit (INDEPENDENT_AMBULATORY_CARE_PROVIDER_SITE_OTHER): Payer: 59 | Admitting: Family Medicine

## 2013-11-09 VITALS — BP 120/74 | Temp 99.4°F | Wt 159.0 lb

## 2013-11-09 DIAGNOSIS — N939 Abnormal uterine and vaginal bleeding, unspecified: Secondary | ICD-10-CM

## 2013-11-09 DIAGNOSIS — N898 Other specified noninflammatory disorders of vagina: Secondary | ICD-10-CM

## 2013-11-09 LAB — POCT URINALYSIS DIPSTICK
Bilirubin, UA: NEGATIVE
Glucose, UA: NEGATIVE
Ketones, UA: NEGATIVE
Leukocytes, UA: NEGATIVE
Nitrite, UA: NEGATIVE
Protein, UA: NEGATIVE
Spec Grav, UA: 1.02
Urobilinogen, UA: 0.2
pH, UA: 6

## 2013-11-09 NOTE — Progress Notes (Signed)
Pre visit review using our clinic review tool, if applicable. No additional management support is needed unless otherwise documented below in the visit note. 

## 2013-11-09 NOTE — Patient Instructions (Signed)
-  please follow up with your gynecologist  -please see a doctor immediately if worsening symptoms or other concerns

## 2013-11-09 NOTE — Progress Notes (Signed)
Chief Complaint  Patient presents with  . Hematuria    and back pain     HPI:  Dysuria, hematuria and low back pain: -last period April 17th but feels like getting period again yesterday as this is how back feels - under a lot of stress - has hx of irr menstrual bleeding and UTI - back pain has resolved - several episode of a little blood on TP when wiped - unsure if urinary or vaginal -not worried about STIs -denies: fevers, chills, nausea, vomiting, dysuria  ROS: See pertinent positives and negatives per HPI.  No past medical history on file.  Past Surgical History  Procedure Laterality Date  . Tonsillectomy    . Cervical discectomy  2007    anterior approach 2007 Dr Saintclair Halsted     Family History  Problem Relation Age of Onset  . Diabetes Mother   . Mitral valve prolapse Mother   . Arthritis Mother   . Emphysema Father     History   Social History  . Marital Status: Married    Spouse Name: N/A    Number of Children: N/A  . Years of Education: N/A   Social History Main Topics  . Smoking status: Never Smoker   . Smokeless tobacco: Never Used  . Alcohol Use: No  . Drug Use: No  . Sexual Activity: Yes    Partners: Male   Other Topics Concern  . 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  GENERAL: vitals reviewed and listed above, alert, oriented, appears well hydrated and in no acute distress  HEENT: atraumatic, conjunttiva  clear, no obvious abnormalities on inspection of external nose and ears  NECK: no obvious masses on inspection  GU/ABD: red blood from cervical oz, no pus, no CMT, normal exam otherwise  MS: moves all extremities without noticeable abnormality  PSYCH: pleasant and cooperative, no obvious depression or anxiety  ASSESSMENT AND PLAN:  Discussed the following assessment and plan:  Vaginal bleeding - Plan: POCT urinalysis dipstick  -irr menstrual bleeding  -Patient advised to return or notify a doctor immediately if symptoms worsen or persist or new concerns arise.  Patient Instructions  -please follow up with your gynecologist  -please see a doctor immediately if worsening symptoms or other concerns     Lucretia Kern

## 2013-11-12 ENCOUNTER — Ambulatory Visit (INDEPENDENT_AMBULATORY_CARE_PROVIDER_SITE_OTHER): Payer: 59 | Admitting: *Deleted

## 2013-11-12 ENCOUNTER — Ambulatory Visit: Payer: 59 | Admitting: *Deleted

## 2013-11-12 DIAGNOSIS — Z23 Encounter for immunization: Secondary | ICD-10-CM

## 2013-11-12 MED ORDER — MEASLES, MUMPS & RUBELLA VAC ~~LOC~~ INJ: 0.5000 mL | INJECTION | Freq: Once | SUBCUTANEOUS | Status: DC

## 2014-01-03 ENCOUNTER — Ambulatory Visit (INDEPENDENT_AMBULATORY_CARE_PROVIDER_SITE_OTHER): Payer: 59 | Admitting: *Deleted

## 2014-01-03 DIAGNOSIS — Z23 Encounter for immunization: Secondary | ICD-10-CM

## 2014-04-23 ENCOUNTER — Encounter: Payer: Self-pay | Admitting: Pulmonary Disease

## 2014-05-26 ENCOUNTER — Ambulatory Visit: Payer: 59 | Admitting: Internal Medicine

## 2014-06-19 ENCOUNTER — Ambulatory Visit (INDEPENDENT_AMBULATORY_CARE_PROVIDER_SITE_OTHER): Payer: BC Managed Care – PPO | Admitting: Internal Medicine

## 2014-06-19 ENCOUNTER — Encounter: Payer: Self-pay | Admitting: Internal Medicine

## 2014-06-19 VITALS — BP 130/80 | HR 84 | Temp 97.8°F | Wt 153.0 lb

## 2014-06-19 DIAGNOSIS — M545 Low back pain, unspecified: Secondary | ICD-10-CM | POA: Insufficient documentation

## 2014-06-19 DIAGNOSIS — F411 Generalized anxiety disorder: Secondary | ICD-10-CM | POA: Insufficient documentation

## 2014-06-19 DIAGNOSIS — F4311 Post-traumatic stress disorder, acute: Secondary | ICD-10-CM

## 2014-06-19 MED ORDER — BUPROPION HCL ER (SR) 100 MG PO TB12: 100.0000 mg | ORAL_TABLET | Freq: Two times a day (BID) | ORAL | Status: DC

## 2014-06-19 MED ORDER — VITAMIN D 1000 UNITS PO TABS: 1000.0000 [IU] | ORAL_TABLET | Freq: Every day | ORAL | Status: AC

## 2014-06-19 NOTE — Progress Notes (Addendum)
   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 Negative for chills, activity change, appetite change and unexpected weight change.  HENT: Negative for congestion, mouth sores, sinus pressure, sore throat and voice change.   Eyes: Negative for visual disturbance.  Respiratory: Negative for cough, chest tightness and shortness of breath.   Cardiovascular: Negative for leg swelling.  Gastrointestinal: Negative for nausea, abdominal pain and diarrhea.  Genitourinary: Negative for frequency, difficulty urinating and vaginal pain.  Musculoskeletal: Negative for back pain and gait problem.  Skin: Negative for pallor and rash.  Neurological: Negative for dizziness, tremors, weakness, numbness and headaches.  Psychiatric/Behavioral: Negative for suicidal ideas, confusion, sleep disturbance and dysphoric mood. The patient is nervous/anxious.        Objective:   Physical Exam  Constitutional: She appears well-developed. No distress.  HENT:  Head: Normocephalic.  Right Ear: External ear normal.  Left Ear: External ear normal.  Nose: Nose normal.  Mouth/Throat: Oropharynx is clear and moist.  Eyes: Conjunctivae are normal. Pupils are equal, round, and reactive to light. Right eye exhibits no discharge. Left eye exhibits no discharge.  Neck: Normal range of motion. Neck supple. No JVD present. No tracheal deviation present. No thyromegaly present.  Cardiovascular: Normal rate, regular rhythm and normal heart sounds.   Pulmonary/Chest: No stridor. No respiratory distress. She has no wheezes.  Abdominal: Soft. Bowel sounds are normal. She exhibits no distension and no mass. There is no tenderness. There is no rebound and no guarding.  Musculoskeletal: She exhibits no edema or  tenderness.  Lymphadenopathy:    She has no cervical adenopathy.  Neurological: She displays normal reflexes. No cranial nerve deficit. She exhibits normal muscle tone. Coordination normal.  Skin: No rash noted. No erythema.  Psychiatric: She has a normal mood and affect. Her behavior is normal. Judgment and thought content normal.     Lab Results  Component Value Date   HGB 14.3 10/03/2013   HCT 42.3 10/03/2013   GLUCOSE 99 10/03/2013   CHOL 179 10/03/2013   TRIG 58.0 10/03/2013   HDL 52.40 10/03/2013   LDLCALC 115* 10/03/2013   NA 140 10/03/2013   K 4.0 10/03/2013   CL 103 10/03/2013   CREATININE 0.9 10/03/2013   BUN 9 10/03/2013   CO2 30 10/03/2013        Assessment & Plan:

## 2014-06-19 NOTE — Progress Notes (Signed)
Pre visit review using our clinic review tool, if applicable. No additional management support is needed unless otherwise documented below in the visit note. 

## 2014-06-19 NOTE — Patient Instructions (Signed)
Gluten free trial (no wheat products) for 4-6 weeks. OK to use gluten-free bread and gluten-free pasta.  Milk free trial (no milk, ice cream, cheese and yogurt) for 4-6 weeks. OK to use almond, coconut, rice or soy milk. "Almond breeze" brand tastes good.  

## 2014-06-23 ENCOUNTER — Telehealth: Payer: Self-pay | Admitting: *Deleted

## 2014-06-23 ENCOUNTER — Other Ambulatory Visit: Payer: Self-pay | Admitting: Internal Medicine

## 2014-06-23 MED ORDER — BUPROPION HCL ER (SR) 100 MG PO TB12: 100.0000 mg | ORAL_TABLET | Freq: Two times a day (BID) | ORAL | Status: DC

## 2014-06-23 NOTE — Telephone Encounter (Signed)
Done both. Pls fax letter Take Wellbutrin 1 a day in am for now Thx

## 2014-06-23 NOTE — Telephone Encounter (Signed)
Pt needs letter stating se is clear to have dental procedure with no pre medication and she is stable for sedation. Please advise.  Fax to [phone].  Also, pt wants to change quantity on Wellbutrin to #60. It is written for bid with # 30. Please advise.

## 2014-06-24 NOTE — Telephone Encounter (Signed)
Letter was faxed.

## 2014-08-20 ENCOUNTER — Encounter: Payer: Self-pay | Admitting: Internal Medicine

## 2014-08-20 ENCOUNTER — Ambulatory Visit (INDEPENDENT_AMBULATORY_CARE_PROVIDER_SITE_OTHER): Payer: BC Managed Care – PPO | Admitting: Internal Medicine

## 2014-08-20 ENCOUNTER — Ambulatory Visit: Payer: BC Managed Care – PPO | Admitting: Internal Medicine

## 2014-08-20 VITALS — BP 140/84 | HR 89 | Temp 98.2°F | Wt 152.0 lb

## 2014-08-20 DIAGNOSIS — S8011XA Contusion of right lower leg, initial encounter: Secondary | ICD-10-CM

## 2014-08-20 DIAGNOSIS — M545 Low back pain, unspecified: Secondary | ICD-10-CM

## 2014-08-20 DIAGNOSIS — F411 Generalized anxiety disorder: Secondary | ICD-10-CM

## 2014-08-20 NOTE — Assessment & Plan Note (Signed)
MSK  

## 2014-08-20 NOTE — Assessment & Plan Note (Signed)
On Wellbutrin - better

## 2014-08-20 NOTE — Progress Notes (Signed)
   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  HENT: Negative for congestion, mouth sores and sinus pressure.   Eyes: Negative for visual disturbance.  Respiratory: Negative for cough and chest tightness.   Gastrointestinal: Negative for nausea and abdominal pain.  Genitourinary: Negative for frequency, difficulty urinating and vaginal pain.  Musculoskeletal: Positive for back pain. Negative for gait problem.  Skin: Negative for pallor and rash.  Neurological: Negative for dizziness, tremors, weakness, numbness and headaches.  Psychiatric/Behavioral: Negative for suicidal ideas, confusion and sleep disturbance. The patient is not nervous/anxious.    Wt Readings from Last 3 Encounters:  08/20/14 152 lb (68.947 kg)  06/19/14 153 lb (69.4 kg)  11/09/13 159 lb (72.122 kg)   BP Readings from Last 3 Encounters:  08/20/14 140/84  06/19/14 130/80  11/09/13 120/74       Objective:   Physical Exam  Constitutional: She appears well-developed. No distress.  HENT:  Head: Normocephalic.  Right Ear: External ear normal.  Left Ear: External ear normal.  Nose: Nose normal.  Mouth/Throat: Oropharynx is clear and moist.  Eyes: Conjunctivae are normal. Pupils are equal, round, and reactive to light. Right eye exhibits no discharge. Left eye exhibits no discharge.  Neck: Normal range of motion. Neck supple. No JVD present. No tracheal deviation present. No thyromegaly present.  Cardiovascular: Normal rate, regular rhythm and normal heart sounds.   Pulmonary/Chest: No stridor. No respiratory distress. She has no wheezes.  Abdominal: Soft. Bowel sounds are  normal. She exhibits no distension and no mass. There is no tenderness. There is no rebound and no guarding.  Musculoskeletal: She exhibits no edema or tenderness.  Lymphadenopathy:    She has no cervical adenopathy.  Neurological: She displays normal reflexes. No cranial nerve deficit. She exhibits normal muscle tone. Coordination normal.  Skin: No rash noted. No erythema.  Psychiatric: She has a normal mood and affect. Her behavior is normal. Judgment and thought content normal.  R post calf w/hematomas, NT        Assessment & Plan:  Patient ID: Shannon Swanson, female   DOB: 12/28/1970, 44 y.o.   MRN: [id number]

## 2014-08-20 NOTE — Progress Notes (Signed)
Pre visit review using our clinic review tool, if applicable. No additional management support is needed unless otherwise documented below in the visit note. 

## 2014-08-21 DIAGNOSIS — S8011XA Contusion of right lower leg, initial encounter: Secondary | ICD-10-CM | POA: Insufficient documentation

## 2014-09-16 LAB — HM MAMMOGRAPHY

## 2014-11-19 ENCOUNTER — Ambulatory Visit: Payer: BC Managed Care – PPO | Admitting: Internal Medicine

## 2014-11-25 ENCOUNTER — Ambulatory Visit (INDEPENDENT_AMBULATORY_CARE_PROVIDER_SITE_OTHER): Payer: BC Managed Care – PPO | Admitting: Internal Medicine

## 2014-11-25 ENCOUNTER — Other Ambulatory Visit (INDEPENDENT_AMBULATORY_CARE_PROVIDER_SITE_OTHER): Payer: BC Managed Care – PPO

## 2014-11-25 ENCOUNTER — Encounter: Payer: Self-pay | Admitting: Internal Medicine

## 2014-11-25 VITALS — BP 128/82 | HR 86 | Wt 153.0 lb

## 2014-11-25 DIAGNOSIS — E538 Deficiency of other specified B group vitamins: Secondary | ICD-10-CM

## 2014-11-25 DIAGNOSIS — S8011XS Contusion of right lower leg, sequela: Secondary | ICD-10-CM

## 2014-11-25 DIAGNOSIS — R002 Palpitations: Secondary | ICD-10-CM | POA: Diagnosis not present

## 2014-11-25 DIAGNOSIS — F411 Generalized anxiety disorder: Secondary | ICD-10-CM

## 2014-11-25 LAB — CBC WITH DIFFERENTIAL/PLATELET
Basophils Absolute: 0 10*3/uL (ref 0.0–0.1)
Basophils Relative: 0.5 % (ref 0.0–3.0)
Eosinophils Absolute: 0.1 10*3/uL (ref 0.0–0.7)
Eosinophils Relative: 1.1 % (ref 0.0–5.0)
HCT: 39.1 % (ref 36.0–46.0)
Hemoglobin: 13.3 g/dL (ref 12.0–15.0)
Lymphocytes Relative: 21.2 % (ref 12.0–46.0)
Lymphs Abs: 1.5 10*3/uL (ref 0.7–4.0)
MCHC: 34 g/dL (ref 30.0–36.0)
MCV: 89 fl (ref 78.0–100.0)
Monocytes Absolute: 0.5 10*3/uL (ref 0.1–1.0)
Monocytes Relative: 6.7 % (ref 3.0–12.0)
Neutro Abs: 4.9 10*3/uL (ref 1.4–7.7)
Neutrophils Relative %: 70.5 % (ref 43.0–77.0)
Platelets: 286 10*3/uL (ref 150.0–400.0)
RBC: 4.39 Mil/uL (ref 3.87–5.11)
RDW: 13 % (ref 11.5–15.5)
WBC: 7 10*3/uL (ref 4.0–10.5)

## 2014-11-25 LAB — TSH: TSH: 1.28 u[IU]/mL (ref 0.35–4.50)

## 2014-11-25 LAB — HEPATIC FUNCTION PANEL
ALT: 20 U/L (ref 0–35)
AST: 20 U/L (ref 0–37)
Albumin: 4.3 g/dL (ref 3.5–5.2)
Alkaline Phosphatase: 65 U/L (ref 39–117)
Bilirubin, Direct: 0.1 mg/dL (ref 0.0–0.3)
Total Bilirubin: 0.5 mg/dL (ref 0.2–1.2)
Total Protein: 7 g/dL (ref 6.0–8.3)

## 2014-11-25 LAB — BASIC METABOLIC PANEL
BUN: 7 mg/dL (ref 6–23)
CO2: 29 mEq/L (ref 19–32)
Calcium: 9.5 mg/dL (ref 8.4–10.5)
Chloride: 103 mEq/L (ref 96–112)
Creatinine, Ser: 0.87 mg/dL (ref 0.40–1.20)
GFR: 74.57 mL/min (ref >60.00)
Glucose, Bld: 90 mg/dL (ref 70–99)
Potassium: 3.8 mEq/L (ref 3.5–5.1)
Sodium: 138 mEq/L (ref 135–145)

## 2014-11-25 LAB — VITAMIN B12: Vitamin B-12: 164 pg/mL — ABNORMAL LOW (ref 211–911)

## 2014-11-25 MED ORDER — BUPROPION HCL ER (XL) 150 MG PO TB24: 150.0000 mg | ORAL_TABLET | Freq: Every morning | ORAL | Status: DC

## 2014-11-25 NOTE — Assessment & Plan Note (Signed)
-  

## 2014-11-25 NOTE — Progress Notes (Signed)
Pre visit review using our clinic review tool, if applicable. No additional management support is needed unless otherwise documented below in the visit note. 

## 2014-11-25 NOTE — Progress Notes (Signed)
   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  HENT: Negative for congestion, mouth sores and sinus pressure.   Eyes: Negative for visual disturbance.  Respiratory: Negative for cough and chest tightness.   Gastrointestinal: Negative for nausea and abdominal pain.  Genitourinary: Negative for frequency, difficulty urinating and vaginal pain.  Musculoskeletal: Positive for back pain. Negative for gait problem.  Skin: Negative for pallor and rash.  Neurological: Negative for dizziness, tremors, weakness, numbness and headaches.  Psychiatric/Behavioral: Negative for suicidal ideas, confusion and sleep disturbance. The patient is not nervous/anxious.    Wt Readings from Last 3 Encounters:  11/25/14 153 lb (69.4 kg)  08/20/14 152 lb (68.947 kg)  06/19/14 153 lb (69.4 kg)   BP Readings from Last 3 Encounters:  11/25/14 128/82  08/20/14 140/84  06/19/14 130/80       Objective:   Physical Exam  Constitutional: She appears well-developed. No distress.  HENT:  Head: Normocephalic.  Right Ear: External ear normal.  Left Ear: External ear normal.  Nose: Nose normal.  Mouth/Throat: Oropharynx is clear and moist.  Eyes: Conjunctivae are normal. Pupils are equal, round, and reactive to light. Right eye exhibits no discharge. Left eye exhibits no discharge.  Neck: Normal range of motion. Neck supple. No JVD present. No tracheal deviation present. No thyromegaly present.  Cardiovascular: Normal rate, regular rhythm and normal heart sounds.   Pulmonary/Chest: No stridor. No respiratory distress. She has no wheezes.  Abdominal: Soft. Bowel sounds are normal. She exhibits no distension and  no mass. There is no tenderness. There is no rebound and no guarding.  Musculoskeletal: She exhibits no edema or tenderness.  Lymphadenopathy:    She has no cervical adenopathy.  Neurological: She displays normal reflexes. No cranial nerve deficit. She exhibits normal muscle tone. Coordination normal.  Skin: No rash noted. No erythema.  Psychiatric: She has a normal mood and affect. Her behavior is normal. Judgment and thought content normal.  R post calf w/o hematomas, a little tender posteriorly, no edema        Assessment & Plan:

## 2014-11-25 NOTE — Assessment & Plan Note (Signed)
Residual burning pain Discussed Sports Med ref

## 2014-11-26 DIAGNOSIS — E538 Deficiency of other specified B group vitamins: Secondary | ICD-10-CM | POA: Insufficient documentation

## 2014-11-26 MED ORDER — CYANOCOBALAMIN 1000 MCG SL SUBL: 1.0000 | SUBLINGUAL_TABLET | Freq: Every day | SUBLINGUAL | Status: DC

## 2014-11-27 ENCOUNTER — Encounter: Payer: Self-pay | Admitting: Internal Medicine

## 2014-11-27 NOTE — Assessment & Plan Note (Signed)
Not on Rx 

## 2014-11-27 NOTE — Assessment & Plan Note (Signed)
Start IM/SL Vit B12

## 2015-05-28 ENCOUNTER — Ambulatory Visit: Payer: BC Managed Care – PPO | Admitting: Internal Medicine

## 2015-06-26 ENCOUNTER — Ambulatory Visit: Payer: BC Managed Care – PPO | Admitting: Internal Medicine

## 2015-07-16 ENCOUNTER — Ambulatory Visit: Payer: BC Managed Care – PPO | Admitting: Internal Medicine

## 2015-07-21 ENCOUNTER — Other Ambulatory Visit: Payer: Self-pay | Admitting: Internal Medicine

## 2015-07-22 ENCOUNTER — Ambulatory Visit: Payer: BC Managed Care – PPO | Admitting: Internal Medicine

## 2015-07-29 ENCOUNTER — Encounter: Payer: Self-pay | Admitting: Internal Medicine

## 2015-07-29 ENCOUNTER — Other Ambulatory Visit (INDEPENDENT_AMBULATORY_CARE_PROVIDER_SITE_OTHER): Payer: BC Managed Care – PPO

## 2015-07-29 ENCOUNTER — Ambulatory Visit (INDEPENDENT_AMBULATORY_CARE_PROVIDER_SITE_OTHER): Payer: BC Managed Care – PPO | Admitting: Internal Medicine

## 2015-07-29 VITALS — BP 120/78 | HR 97 | Wt 158.0 lb

## 2015-07-29 DIAGNOSIS — E538 Deficiency of other specified B group vitamins: Secondary | ICD-10-CM

## 2015-07-29 DIAGNOSIS — F411 Generalized anxiety disorder: Secondary | ICD-10-CM | POA: Diagnosis not present

## 2015-07-29 DIAGNOSIS — M5440 Lumbago with sciatica, unspecified side: Secondary | ICD-10-CM

## 2015-07-29 LAB — CBC WITH DIFFERENTIAL/PLATELET
Basophils Absolute: 0 10*3/uL (ref 0.0–0.1)
Basophils Relative: 0.3 % (ref 0.0–3.0)
Eosinophils Absolute: 0.1 10*3/uL (ref 0.0–0.7)
Eosinophils Relative: 1.3 % (ref 0.0–5.0)
HCT: 41 % (ref 36.0–46.0)
Hemoglobin: 13.7 g/dL (ref 12.0–15.0)
Lymphocytes Relative: 21.4 % (ref 12.0–46.0)
Lymphs Abs: 1.8 10*3/uL (ref 0.7–4.0)
MCHC: 33.3 g/dL (ref 30.0–36.0)
MCV: 91.1 fl (ref 78.0–100.0)
Monocytes Absolute: 0.5 10*3/uL (ref 0.1–1.0)
Monocytes Relative: 5.8 % (ref 3.0–12.0)
Neutro Abs: 5.9 10*3/uL (ref 1.4–7.7)
Neutrophils Relative %: 71.2 % (ref 43.0–77.0)
Platelets: 317 10*3/uL (ref 150.0–400.0)
RBC: 4.5 Mil/uL (ref 3.87–5.11)
RDW: 12.6 % (ref 11.5–15.5)
WBC: 8.2 10*3/uL (ref 4.0–10.5)

## 2015-07-29 LAB — VITAMIN B12: Vitamin B-12: 338 pg/mL (ref 211–911)

## 2015-07-29 MED ORDER — CYANOCOBALAMIN 1000 MCG/ML IJ SOLN
1000.0000 ug | Freq: Once | INTRAMUSCULAR | Status: AC
  Administered 2015-07-29: 1000 ug via INTRAMUSCULAR

## 2015-07-29 MED ORDER — BUPROPION HCL ER (XL) 150 MG PO TB24: 150.0000 mg | ORAL_TABLET | Freq: Every morning | ORAL | Status: DC

## 2015-07-29 NOTE — Assessment & Plan Note (Signed)
Labs  Re-start B12 

## 2015-07-29 NOTE — Patient Instructions (Signed)

## 2015-07-29 NOTE — Assessment & Plan Note (Signed)
Wellbutrin po 

## 2015-07-29 NOTE — Progress Notes (Signed)
Pre visit review using our clinic review tool, if applicable. No additional management support is needed unless otherwise documented below in the visit note. 

## 2015-07-29 NOTE — Progress Notes (Signed)
Subjective:  Patient ID: Shannon Swanson, female    DOB: 08-20-1970  Age: 45 y.o. MRN: [id number]  CC: No chief complaint on file.   HPI Shannon Swanson presents for B12 def , fatigue- never read her MyChart message. Pt took sl B12 at times however.  Outpatient Prescriptions Prior to Visit  Medication Sig Dispense Refill  . Cyanocobalamin 1000 MCG SUBL Place 1 tablet (1,000 mcg total) under the tongue daily. 100 tablet 11  . lactase (LACTAID) 3000 UNITS tablet Take 1 tablet by mouth 3 (three) times daily with meals.    Marland Kitchen buPROPion (WELLBUTRIN XL) 150 MG 24 hr tablet Take 1 tablet (150 mg total) by mouth every morning. 90 tablet 1   No facility-administered medications prior to visit.    ROS Review of Systems  Constitutional: Negative for chills, activity change, appetite change, fatigue and unexpected weight change.  HENT: Negative for congestion, mouth sores and sinus pressure.   Eyes: Negative for visual disturbance.  Respiratory: Negative for cough and chest tightness.   Gastrointestinal: Negative for nausea and abdominal pain.  Genitourinary: Negative for frequency, difficulty urinating and vaginal pain.  Musculoskeletal: Negative for back pain and gait problem.  Skin: Negative for pallor and rash.  Neurological: Negative for dizziness, tremors, weakness, numbness and headaches.  Psychiatric/Behavioral: Negative for suicidal ideas, confusion and sleep disturbance. The patient is nervous/anxious.     Objective:  BP 120/78 mmHg  Pulse 97  Wt 158 lb (71.668 kg)  SpO2 97%  BP Readings from Last 3 Encounters:  07/29/15 120/78  11/25/14 128/82  08/20/14 140/84    Wt Readings from Last 3 Encounters:  07/29/15 158 lb (71.668 kg)  11/25/14 153 lb (69.4 kg)  08/20/14 152 lb (68.947 kg)    Physical Exam  Constitutional: She appears well-developed. No distress.  HENT:  Head: Normocephalic.  Right Ear: External ear normal.  Left Ear: External ear normal.  Nose:  Nose normal.  Mouth/Throat: Oropharynx is clear and moist.  Eyes: Conjunctivae are normal. Pupils are equal, round, and reactive to light. Right eye exhibits no discharge. Left eye exhibits no discharge.  Neck: Normal range of motion. Neck supple. No JVD present. No tracheal deviation present. No thyromegaly present.  Cardiovascular: Normal rate, regular rhythm and normal heart sounds.   Pulmonary/Chest: No stridor. No respiratory distress. She has no wheezes.  Abdominal: Soft. Bowel sounds are normal. She exhibits no distension and no mass. There is no tenderness. There is no rebound and no guarding.  Musculoskeletal: She exhibits no edema or tenderness.  Lymphadenopathy:    She has no cervical adenopathy.  Neurological: She displays normal reflexes. No cranial nerve deficit. She exhibits normal muscle tone. Coordination normal.  Skin: No rash noted. No erythema.  Psychiatric: She has a normal mood and affect. Her behavior is normal. Judgment and thought content normal.    Lab Results  Component Value Date   WBC 8.2 07/29/2015   HGB 13.7 07/29/2015   HCT 41.0 07/29/2015   PLT 317.0 07/29/2015   GLUCOSE 90 11/25/2014   CHOL 179 10/03/2013   TRIG 58.0 10/03/2013   HDL 52.40 10/03/2013   LDLCALC 115* 10/03/2013   ALT 20 11/25/2014   AST 20 11/25/2014   NA 138 11/25/2014   K 3.8 11/25/2014   CL 103 11/25/2014   CREATININE 0.87 11/25/2014   BUN 7 11/25/2014   CO2 29 11/25/2014   TSH 1.28 11/25/2014    Dg Cervical Spine 2-3 Views  05/12/2011  *  RADIOLOGY REPORT* Clinical Data: Neck pain.  History of cervical fusion. CERVICAL SPINE - 2-3 VIEW Comparison: None Findings: The neutral lateral film demonstrates normal alignment of the cervical vertebral bodies.  Mild straightening of the normal cervical lordosis is noted.  There are anterior plate screws at C5- 6 along with solid interbody fusion changes.  No complicating features are demonstrated.  No acute bony findings or abnormal  prevertebral soft tissue swelling.  The flexion/extension views demonstrate no instability or subluxation. IMPRESSION: 1.  Solid fusion at 0000000 without complicating features. 2.  No acute bony findings and no instability or subluxation with flexion/extension. 3.  Mild degenerative changes at C4-5. Original Report Authenticated By: P. Kalman Jewels, M.D.   Assessment & Plan:   Diagnoses and all orders for this visit:  B12 deficiency -     CBC with Differential/Platelet; Future -     Vitamin B12; Future -     cyanocobalamin ((VITAMIN B-12)) injection 1,000 mcg; Inject 1 mL (1,000 mcg total) into the muscle once.  Generalized anxiety disorder  Other orders -     buPROPion (WELLBUTRIN XL) 150 MG 24 hr tablet; Take 1 tablet (150 mg total) by mouth every morning.  I am having Ms. Mease maintain her lactase, Cyanocobalamin, and buPROPion. We administered cyanocobalamin.  Meds ordered this encounter  Medications  . buPROPion (WELLBUTRIN XL) 150 MG 24 hr tablet    Sig: Take 1 tablet (150 mg total) by mouth every morning.    Dispense:  90 tablet    Refill:  1  . cyanocobalamin ((VITAMIN B-12)) injection 1,000 mcg    Sig:      Follow-up: Return in about 3 months (around 10/27/2015) for a follow-up visit.  Walker Kehr, MD

## 2015-07-30 NOTE — Assessment & Plan Note (Signed)
ROM exercises 

## 2015-08-25 ENCOUNTER — Telehealth: Payer: Self-pay | Admitting: Internal Medicine

## 2015-08-25 NOTE — Telephone Encounter (Signed)
Noted. Thx.

## 2015-08-25 NOTE — Telephone Encounter (Signed)
I called pt- she states the CP is intermittent, she feels like her HR is rapid at times and she has a hard time getting a deep breath.  It does seem worse after meals. She feels like she can wait until her OV with PCP on 08/28/15. However, I advised her to get herself seen at [facility] or ER if her symptoms persist or worsen between now and then. She voiced understanding and agreed. Please advise if anything is needed before 08/28/15.

## 2015-08-25 NOTE — Telephone Encounter (Signed)
Pt just left her appointment with her OB, Dr. Vania Rea. She states he will be sending some notes over for Dr. Camila Li to go over. She has been having spasms in her lower abdomen and some chest pain discomfort that has been on and off for a while. He suggested she come in to see Dr. Camila Li for an EKG and a possible CT of the abdomen.  I put her in for Friday afternoon. This was his first 30 min appt available.  If you feel she needs to be in sooner please let me know.

## 2015-08-28 ENCOUNTER — Encounter: Payer: Self-pay | Admitting: Internal Medicine

## 2015-08-28 ENCOUNTER — Ambulatory Visit (INDEPENDENT_AMBULATORY_CARE_PROVIDER_SITE_OTHER)
Admission: RE | Admit: 2015-08-28 | Discharge: 2015-08-28 | Disposition: A | Discharge status: Home / Self Care | Payer: BC Managed Care – PPO | Source: Ambulatory Visit | Attending: Internal Medicine | Admitting: Internal Medicine

## 2015-08-28 ENCOUNTER — Ambulatory Visit (INDEPENDENT_AMBULATORY_CARE_PROVIDER_SITE_OTHER): Payer: BC Managed Care – PPO | Admitting: Internal Medicine

## 2015-08-28 VITALS — BP 112/70 | HR 79 | Wt 158.0 lb

## 2015-08-28 DIAGNOSIS — R0600 Dyspnea, unspecified: Secondary | ICD-10-CM | POA: Diagnosis not present

## 2015-08-28 DIAGNOSIS — F411 Generalized anxiety disorder: Secondary | ICD-10-CM

## 2015-08-28 DIAGNOSIS — I059 Rheumatic mitral valve disease, unspecified: Secondary | ICD-10-CM

## 2015-08-28 DIAGNOSIS — E538 Deficiency of other specified B group vitamins: Secondary | ICD-10-CM | POA: Diagnosis not present

## 2015-08-28 DIAGNOSIS — R0789 Other chest pain: Secondary | ICD-10-CM | POA: Diagnosis not present

## 2015-08-28 MED ORDER — ESCITALOPRAM OXALATE 5 MG PO TABS: 5.0000 mg | ORAL_TABLET | Freq: Every day | ORAL | Status: DC

## 2015-08-28 NOTE — Progress Notes (Signed)
Subjective:  Patient ID: Shannon Swanson, female    DOB: Jul 08, 1971  Age: 45 y.o. MRN: [id number]  CC: No chief complaint on file.   HPI Shannon Swanson presents for trouble catching her breath off and on when laying down, chest pressure.  Outpatient Prescriptions Prior to Visit  Medication Sig Dispense Refill  . Cyanocobalamin 1000 MCG SUBL Place 1 tablet (1,000 mcg total) under the tongue daily. 100 tablet 11  . lactase (LACTAID) 3000 UNITS tablet Take 1 tablet by mouth 3 (three) times daily with meals.    Marland Kitchen buPROPion (WELLBUTRIN XL) 150 MG 24 hr tablet Take 1 tablet (150 mg total) by mouth every morning. 90 tablet 1   No facility-administered medications prior to visit.    ROS Review of Systems  Constitutional: Negative for chills, activity change, appetite change, fatigue and unexpected weight change.  HENT: Negative for congestion, mouth sores and sinus pressure.   Eyes: Negative for visual disturbance.  Respiratory: Positive for chest tightness and shortness of breath. Negative for cough, wheezing and stridor.   Gastrointestinal: Negative for nausea and abdominal pain.  Genitourinary: Negative for frequency, difficulty urinating and vaginal pain.  Musculoskeletal: Negative for back pain and gait problem.  Skin: Negative for pallor and rash.  Neurological: Negative for dizziness, tremors, weakness, numbness and headaches.  Psychiatric/Behavioral: Positive for sleep disturbance. Negative for confusion. The patient is nervous/anxious.     Objective:  BP 112/70 mmHg  Pulse 79  Wt 158 lb (71.668 kg)  SpO2 97%  LMP 08/15/2015  BP Readings from Last 3 Encounters:  08/28/15 112/70  07/29/15 120/78  11/25/14 128/82    Wt Readings from Last 3 Encounters:  08/28/15 158 lb (71.668 kg)  07/29/15 158 lb (71.668 kg)  11/25/14 153 lb (69.4 kg)    Physical Exam  Constitutional: She appears well-developed. No distress.  HENT:  Head: Normocephalic.  Right Ear: External  ear normal.  Left Ear: External ear normal.  Nose: Nose normal.  Mouth/Throat: Oropharynx is clear and moist.  Eyes: Conjunctivae are normal. Pupils are equal, round, and reactive to light. Right eye exhibits no discharge. Left eye exhibits no discharge.  Neck: Normal range of motion. Neck supple. No JVD present. No tracheal deviation present. No thyromegaly present.  Cardiovascular: Normal rate, regular rhythm and normal heart sounds.   Pulmonary/Chest: No stridor. No respiratory distress. She has no wheezes.  Abdominal: Soft. Bowel sounds are normal. She exhibits no distension and no mass. There is no tenderness. There is no rebound and no guarding.  Musculoskeletal: She exhibits no edema or tenderness.  Lymphadenopathy:    She has no cervical adenopathy.  Neurological: She displays normal reflexes. No cranial nerve deficit. She exhibits normal muscle tone. Coordination normal.  Skin: No rash noted. No erythema.  Psychiatric: She has a normal mood and affect. Her behavior is normal. Judgment and thought content normal.  slight tachycardia  EKG NSR  Lab Results  Component Value Date   WBC 8.2 07/29/2015   HGB 13.7 07/29/2015   HCT 41.0 07/29/2015   PLT 317.0 07/29/2015   GLUCOSE 90 11/25/2014   CHOL 179 10/03/2013   TRIG 58.0 10/03/2013   HDL 52.40 10/03/2013   LDLCALC 115* 10/03/2013   ALT 20 11/25/2014   AST 20 11/25/2014   NA 138 11/25/2014   K 3.8 11/25/2014   CL 103 11/25/2014   CREATININE 0.87 11/25/2014   BUN 7 11/25/2014   CO2 29 11/25/2014   TSH 1.28 11/25/2014  Dg Cervical Spine 2-3 Views  05/12/2011  *RADIOLOGY REPORT* Clinical Data: Neck pain.  History of cervical fusion. CERVICAL SPINE - 2-3 VIEW Comparison: None Findings: The neutral lateral film demonstrates normal alignment of the cervical vertebral bodies.  Mild straightening of the normal cervical lordosis is noted.  There are anterior plate screws at C5- 6 along with solid interbody fusion changes.  No  complicating features are demonstrated.  No acute bony findings or abnormal prevertebral soft tissue swelling.  The flexion/extension views demonstrate no instability or subluxation. IMPRESSION: 1.  Solid fusion at 0000000 without complicating features. 2.  No acute bony findings and no instability or subluxation with flexion/extension. 3.  Mild degenerative changes at C4-5. Original Report Authenticated By: P. Kalman Jewels, M.D.   Assessment & Plan:   Diagnoses and all orders for this visit:  Dyspnea -     DG Chest 2 View -     EKG 12-Lead  Chest pain, atypical -     DG Chest 2 View -     EKG 12-Lead  Other orders -     escitalopram (LEXAPRO) 5 MG tablet; Take 1 tablet (5 mg total) by mouth daily.   I have discontinued Ms. Clinkscale's buPROPion. I am also having her start on escitalopram. Additionally, I am having her maintain her lactase, Cyanocobalamin, and aspirin.  Meds ordered this encounter  Medications  . aspirin 81 MG chewable tablet    Sig: Chew 324 mg by mouth daily.  Marland Kitchen escitalopram (LEXAPRO) 5 MG tablet    Sig: Take 1 tablet (5 mg total) by mouth daily.    Dispense:  30 tablet    Refill:  5     Follow-up: Return in about 4 weeks (around 09/25/2015) for a follow-up visit.  Walker Kehr, MD

## 2015-08-28 NOTE — Assessment & Plan Note (Signed)
2/17 ?due to wellbutrin EKG, CXR,labs Hold Wellbutrin

## 2015-08-28 NOTE — Assessment & Plan Note (Signed)
On B12 

## 2015-08-28 NOTE — Patient Instructions (Signed)
Hold Wellbutrin

## 2015-08-28 NOTE — Progress Notes (Signed)
Pre visit review using our clinic review tool, if applicable. No additional management support is needed unless otherwise documented below in the visit note. 

## 2015-08-28 NOTE — Assessment & Plan Note (Addendum)
Start Lexapro in 2 wks if ok

## 2015-08-28 NOTE — Assessment & Plan Note (Signed)
ECHO if problems

## 2015-09-30 ENCOUNTER — Ambulatory Visit: Payer: BC Managed Care – PPO | Admitting: Internal Medicine

## 2015-10-29 ENCOUNTER — Ambulatory Visit: Payer: BC Managed Care – PPO | Admitting: Internal Medicine

## 2015-10-29 ENCOUNTER — Telehealth: Payer: Self-pay | Admitting: Internal Medicine

## 2015-10-29 DIAGNOSIS — Z0289 Encounter for other administrative examinations: Secondary | ICD-10-CM

## 2015-10-29 NOTE — Telephone Encounter (Signed)
Noted. Thx.

## 2015-10-29 NOTE — Telephone Encounter (Signed)
FYI, Pt called stating she has not started the medication for the appt today. Appt is still on the sch. Thank you!

## 2016-04-08 DIAGNOSIS — R938 Abnormal findings on diagnostic imaging of other specified body structures: Secondary | ICD-10-CM | POA: Diagnosis not present

## 2016-04-08 DIAGNOSIS — J0100 Acute maxillary sinusitis, unspecified: Secondary | ICD-10-CM | POA: Diagnosis not present

## 2016-04-21 DIAGNOSIS — R002 Palpitations: Secondary | ICD-10-CM | POA: Diagnosis not present

## 2016-04-28 ENCOUNTER — Other Ambulatory Visit (HOSPITAL_COMMUNITY): Payer: Self-pay | Admitting: Physician Assistant

## 2016-04-28 ENCOUNTER — Ambulatory Visit (HOSPITAL_COMMUNITY)
Admission: RE | Admit: 2016-04-28 | Discharge: 2016-04-28 | Disposition: A | Discharge status: Home / Self Care | Payer: 59 | Source: Ambulatory Visit | Attending: Physician Assistant | Admitting: Physician Assistant

## 2016-04-28 ENCOUNTER — Ambulatory Visit
Admission: RE | Admit: 2016-04-28 | Discharge: 2016-04-28 | Disposition: A | Discharge status: Home / Self Care | Payer: 59 | Source: Ambulatory Visit | Attending: Physician Assistant | Admitting: Physician Assistant

## 2016-04-28 DIAGNOSIS — R918 Other nonspecific abnormal finding of lung field: Secondary | ICD-10-CM | POA: Diagnosis not present

## 2016-04-28 DIAGNOSIS — Z0000 Encounter for general adult medical examination without abnormal findings: Secondary | ICD-10-CM | POA: Diagnosis not present

## 2016-04-28 DIAGNOSIS — R9389 Abnormal findings on diagnostic imaging of other specified body structures: Secondary | ICD-10-CM

## 2016-04-28 DIAGNOSIS — E538 Deficiency of other specified B group vitamins: Secondary | ICD-10-CM | POA: Diagnosis not present

## 2016-04-28 DIAGNOSIS — R911 Solitary pulmonary nodule: Secondary | ICD-10-CM

## 2016-04-28 DIAGNOSIS — R938 Abnormal findings on diagnostic imaging of other specified body structures: Secondary | ICD-10-CM | POA: Diagnosis not present

## 2016-05-20 DIAGNOSIS — R002 Palpitations: Secondary | ICD-10-CM | POA: Diagnosis not present

## 2016-08-19 MED FILL — SF 5000 PLUS CREAM: 1.1 | 30 days supply | Qty: 51 | Fill #0

## 2016-09-08 DIAGNOSIS — J329 Chronic sinusitis, unspecified: Secondary | ICD-10-CM | POA: Diagnosis not present

## 2016-09-08 DIAGNOSIS — H66012 Acute suppurative otitis media with spontaneous rupture of ear drum, left ear: Secondary | ICD-10-CM | POA: Diagnosis not present

## 2016-09-08 DIAGNOSIS — M2669 Other specified disorders of temporomandibular joint: Secondary | ICD-10-CM | POA: Diagnosis not present

## 2016-09-08 DIAGNOSIS — H9203 Otalgia, bilateral: Secondary | ICD-10-CM | POA: Diagnosis not present

## 2016-09-08 MED FILL — OFLOXACIN 0.3% EAR DROPS: 0.3 | 10 days supply | Qty: 10 | Fill #0

## 2016-09-08 MED FILL — AMOXICILLIN 400 MG/5 ML SUS: 400 | 10 days supply | Qty: 300 | Fill #0

## 2016-09-16 DIAGNOSIS — H6502 Acute serous otitis media, left ear: Secondary | ICD-10-CM | POA: Diagnosis not present

## 2016-09-16 DIAGNOSIS — M26629 Arthralgia of temporomandibular joint, unspecified side: Secondary | ICD-10-CM | POA: Diagnosis not present

## 2016-09-16 DIAGNOSIS — H66012 Acute suppurative otitis media with spontaneous rupture of ear drum, left ear: Secondary | ICD-10-CM | POA: Diagnosis not present

## 2016-09-16 DIAGNOSIS — H9012 Conductive hearing loss, unilateral, left ear, with unrestricted hearing on the contralateral side: Secondary | ICD-10-CM | POA: Diagnosis not present

## 2016-11-07 MED FILL — AMOXICILLIN 400 MG/5 ML SUS: 400 | 10 days supply | Qty: 200 | Fill #0

## 2016-11-07 MED FILL — HYDROCODON-APAP 7.5-325: 7.5-325 | 1 days supply | Qty: 2 | Fill #0

## 2016-11-07 MED FILL — ONDANSETRON ODT 8 MG TABLET: 8 | 1 days supply | Qty: 4 | Fill #0

## 2017-01-03 MED FILL — predniSONE 10 MG (21) TBPK: 10 | 6 days supply | Qty: 21 | Fill #0

## 2017-01-12 DIAGNOSIS — R109 Unspecified abdominal pain: Secondary | ICD-10-CM | POA: Diagnosis not present

## 2017-01-12 DIAGNOSIS — R300 Dysuria: Secondary | ICD-10-CM | POA: Diagnosis not present

## 2017-02-08 DIAGNOSIS — C801 Malignant (primary) neoplasm, unspecified: Secondary | ICD-10-CM | POA: Diagnosis not present

## 2017-02-08 DIAGNOSIS — C7951 Secondary malignant neoplasm of bone: Secondary | ICD-10-CM | POA: Diagnosis not present

## 2017-02-20 DIAGNOSIS — Z01419 Encounter for gynecological examination (general) (routine) without abnormal findings: Secondary | ICD-10-CM | POA: Diagnosis not present

## 2017-02-20 DIAGNOSIS — Z1231 Encounter for screening mammogram for malignant neoplasm of breast: Secondary | ICD-10-CM | POA: Diagnosis not present

## 2017-02-20 DIAGNOSIS — Z6824 Body mass index (BMI) 24.0-24.9, adult: Secondary | ICD-10-CM | POA: Diagnosis not present

## 2017-02-20 DIAGNOSIS — Z124 Encounter for screening for malignant neoplasm of cervix: Secondary | ICD-10-CM | POA: Diagnosis not present

## 2017-02-20 MED FILL — NEOMYCIN-POLYMYXIN-HC EAR S: 3.5-10000-1 | 8 days supply | Qty: 10 | Fill #0

## 2017-05-24 MED FILL — AMOXICILLIN 500 MG CAPSULE: 500 | 5 days supply | Qty: 10 | Fill #0

## 2017-06-26 DIAGNOSIS — F411 Generalized anxiety disorder: Secondary | ICD-10-CM | POA: Diagnosis not present

## 2017-06-26 MED FILL — SERTRALINE HCL 50 MG TABLET: 50 | 30 days supply | Qty: 45 | Fill #0

## 2017-08-10 DIAGNOSIS — F411 Generalized anxiety disorder: Secondary | ICD-10-CM | POA: Diagnosis not present

## 2017-08-10 MED FILL — ESCITALOPRAM 5 MG TABLET: 5 | 30 days supply | Qty: 30 | Fill #0

## 2017-09-12 MED FILL — METHYLPHENIDATE ER 20 MG TA: 20 | 30 days supply | Qty: 30 | Fill #0

## 2017-09-21 MED FILL — ESCITALOPRAM 5 MG TABLET: 5 | 30 days supply | Qty: 30 | Fill #1

## 2017-09-26 DIAGNOSIS — F411 Generalized anxiety disorder: Secondary | ICD-10-CM | POA: Diagnosis not present

## 2017-09-26 MED FILL — ESCITALOPRAM 10 MG TABLET: 10 | 30 days supply | Qty: 30 | Fill #0 | Status: TO

## 2017-09-26 MED FILL — METHYLPHENIDATE 5 MG TABLET: 5 | 30 days supply | Qty: 60 | Fill #0

## 2017-10-05 DIAGNOSIS — F988 Other specified behavioral and emotional disorders with onset usually occurring in childhood and adolescence: Secondary | ICD-10-CM | POA: Diagnosis not present

## 2017-10-05 DIAGNOSIS — K219 Gastro-esophageal reflux disease without esophagitis: Secondary | ICD-10-CM | POA: Diagnosis not present

## 2017-10-05 DIAGNOSIS — F4321 Adjustment disorder with depressed mood: Secondary | ICD-10-CM | POA: Diagnosis not present

## 2017-10-05 DIAGNOSIS — R0602 Shortness of breath: Secondary | ICD-10-CM | POA: Diagnosis not present

## 2017-10-05 DIAGNOSIS — E538 Deficiency of other specified B group vitamins: Secondary | ICD-10-CM | POA: Diagnosis not present

## 2017-10-05 MED FILL — OMEPRAZOLE 20 MG CAP: 20 | 30 days supply | Qty: 30 | Fill #0

## 2017-10-06 ENCOUNTER — Encounter: Payer: Self-pay | Admitting: Internal Medicine

## 2017-10-06 DIAGNOSIS — R0602 Shortness of breath: Secondary | ICD-10-CM | POA: Diagnosis not present

## 2017-10-25 ENCOUNTER — Ambulatory Visit (HOSPITAL_COMMUNITY)
Admission: EM | Admit: 2017-10-25 | Discharge: 2017-10-25 | Disposition: A | Discharge status: Home / Self Care | Payer: 59 | Attending: Family Medicine | Admitting: Family Medicine

## 2017-10-25 ENCOUNTER — Encounter (HOSPITAL_COMMUNITY): Payer: Self-pay | Admitting: Emergency Medicine

## 2017-10-25 DIAGNOSIS — R110 Nausea: Secondary | ICD-10-CM | POA: Diagnosis not present

## 2017-10-25 DIAGNOSIS — R109 Unspecified abdominal pain: Secondary | ICD-10-CM

## 2017-10-25 DIAGNOSIS — R319 Hematuria, unspecified: Secondary | ICD-10-CM

## 2017-10-25 LAB — POCT URINALYSIS DIP (DEVICE)
Bilirubin Urine: NEGATIVE
Glucose, UA: NEGATIVE mg/dL
Ketones, ur: NEGATIVE mg/dL
Leukocytes, UA: NEGATIVE
Nitrite: NEGATIVE
Protein, ur: NEGATIVE mg/dL
Specific Gravity, Urine: 1.025 (ref 1.005–1.030)
Urobilinogen, UA: 0.2 mg/dL (ref 0.0–1.0)
pH: 7 (ref 5.0–8.0)

## 2017-10-25 MED ORDER — TAMSULOSIN HCL 0.4 MG PO CAPS: 0.4000 mg | ORAL_CAPSULE | Freq: Every day | ORAL | 0 refills | Status: DC

## 2017-10-25 MED FILL — TAMSULOSIN HCL 0.4 MG CAP: 0.4 | 10 days supply | Qty: 10 | Fill #0

## 2017-10-25 MED FILL — ESCITALOPRAM 10 MG TABLET: 10 | 30 days supply | Qty: 30 | Fill #0 | Status: TO

## 2017-10-25 NOTE — ED Provider Notes (Addendum)
[facility]   [id number] 10/25/17 Arrival Time: 1000   SUBJECTIVE:  Shannon Swanson is a 47 y.o. female who presents to the urgent care with complaint of left flank pain that began this morning.  She awoke feeling the need to void and after going to the bathroom, experienced frequency and left flank and groin pain which subsequently eased.  Pain was associated with nausea and was quite intense for a few minutes.  Currently, the pain has subsided  No fever or h/o kidney stones.     History reviewed. No pertinent past medical history. Family History  Problem Relation Age of Onset  . Diabetes Mother   . Mitral valve prolapse Mother   . Arthritis Mother   . Emphysema Father    Social History   Socioeconomic History  . Marital status: Married    Spouse name: Not on file  . Number of children: Not on file  . Years of education: Not on file  . Highest education level: Not on file  Occupational History  . Not on file  Social Needs  . Financial resource strain: Not on file  . Food insecurity:    Worry: Not on file    Inability: Not on file  . Transportation needs:    Medical: Not on file    Non-medical: Not on file  Tobacco Use  . Smoking status: Never Smoker  . Smokeless tobacco: Never Used  Substance and Sexual Activity  . Alcohol use: No  . Drug use: No  . Sexual activity: Yes    Partners: Male  Lifestyle  . Physical activity:    Days per week: Not on file    Minutes per session: Not on file  . Stress: Not on file  Relationships  . Social connections:    Talks on phone: Not on file    Gets together: Not on file    Attends religious service: Not on file    Active member of club or organization: Not on file    Attends meetings of clubs or organizations: Not on file    Relationship status: Not on file  . Intimate partner violence:    Fear of current or ex partner: Not on file    Emotionally abused: Not on file    Physically abused: Not on file    Forced sexual activity: Not on file  Other Topics Concern  . 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   Allergies  Allergen Reactions  . Codeine   . Meperidine Hcl       ROS: As per HPI, remainder of ROS negative.   OBJECTIVE:   Vitals:   10/25/17 1029  BP: 140/86  Pulse: 72  Resp: 18  Temp: 98.3 F (36.8 C)  TempSrc: Oral  SpO2: 99%     General appearance: alert; no distress Eyes: PERRL; EOMI; conjunctiva normal HENT: normocephalic; atraumatic;  oral mucosa normal Neck: supple Back: mild left CVA tenderness Extremities: no cyanosis or edema; symmetrical with no gross deformities Skin: warm and dry Neurologic: normal gait; grossly normal Psychological: alert and cooperative; normal mood and affect      Labs:  Results for orders placed or performed during the  hospital encounter of 10/25/17  POCT urinalysis dip (device)  Result Value Ref Range   Glucose, UA NEGATIVE NEGATIVE mg/dL   Bilirubin Urine NEGATIVE NEGATIVE   Ketones, ur NEGATIVE NEGATIVE mg/dL   Specific Gravity, Urine 1.025 1.005 - 1.030   Hgb urine dipstick MODERATE (A) NEGATIVE   pH 7.0 5.0 - 8.0   Protein, ur NEGATIVE NEGATIVE mg/dL   Urobilinogen, UA 0.2 0.0 - 1.0 mg/dL   Nitrite NEGATIVE NEGATIVE   Leukocytes, UA NEGATIVE NEGATIVE    Labs Reviewed  POCT URINALYSIS DIP (DEVICE) - Abnormal; Notable for the following components:      Result Value   Hgb urine dipstick MODERATE (*)    All other components within normal limits    No results found.     ASSESSMENT & PLAN:  1. Left flank pain   2. Hematuria, unspecified type     Meds ordered this encounter  Medications  . tamsulosin (FLOMAX) 0.4 MG CAPS capsule    Sig:  Take 1 capsule (0.4 mg total) by mouth daily.    Dispense:  10 capsule    Refill:  0    Reviewed expectations re: course of current medical issues. Questions answered. Outlined signs and symptoms indicating need for more acute intervention. Patient verbalized understanding. After Visit Summary given.    Procedures:      Robyn Haber, MD 10/25/17 1040    Robyn Haber, MD 10/25/17 1053

## 2017-10-25 NOTE — ED Triage Notes (Signed)
Pt sts dysuria this am and then severe left flank pain that subsided; pt sts some soreness at present

## 2017-11-07 MED FILL — METHYLPHENIDATE ER 20 MG TA: 20 | 30 days supply | Qty: 30 | Fill #0

## 2017-11-27 DIAGNOSIS — F411 Generalized anxiety disorder: Secondary | ICD-10-CM | POA: Diagnosis not present

## 2017-11-27 MED FILL — ESCITALOPRAM 10 MG TABLET: 10 | 30 days supply | Qty: 30 | Fill #0

## 2017-12-08 ENCOUNTER — Encounter: Payer: Self-pay | Admitting: *Deleted

## 2017-12-11 ENCOUNTER — Ambulatory Visit (HOSPITAL_COMMUNITY)
Admission: EM | Admit: 2017-12-11 | Discharge: 2017-12-11 | Disposition: A | Discharge status: Home / Self Care | Payer: 59 | Attending: Family Medicine | Admitting: Family Medicine

## 2017-12-11 ENCOUNTER — Other Ambulatory Visit: Payer: Self-pay

## 2017-12-11 ENCOUNTER — Encounter (HOSPITAL_COMMUNITY): Payer: Self-pay | Admitting: Emergency Medicine

## 2017-12-11 DIAGNOSIS — F411 Generalized anxiety disorder: Secondary | ICD-10-CM | POA: Insufficient documentation

## 2017-12-11 DIAGNOSIS — R300 Dysuria: Secondary | ICD-10-CM | POA: Diagnosis not present

## 2017-12-11 DIAGNOSIS — F988 Other specified behavioral and emotional disorders with onset usually occurring in childhood and adolescence: Secondary | ICD-10-CM | POA: Insufficient documentation

## 2017-12-11 DIAGNOSIS — J029 Acute pharyngitis, unspecified: Secondary | ICD-10-CM | POA: Insufficient documentation

## 2017-12-11 DIAGNOSIS — E538 Deficiency of other specified B group vitamins: Secondary | ICD-10-CM | POA: Insufficient documentation

## 2017-12-11 DIAGNOSIS — Z7982 Long term (current) use of aspirin: Secondary | ICD-10-CM | POA: Insufficient documentation

## 2017-12-11 DIAGNOSIS — Z79899 Other long term (current) drug therapy: Secondary | ICD-10-CM | POA: Diagnosis not present

## 2017-12-11 DIAGNOSIS — K219 Gastro-esophageal reflux disease without esophagitis: Secondary | ICD-10-CM | POA: Insufficient documentation

## 2017-12-11 DIAGNOSIS — Z7951 Long term (current) use of inhaled steroids: Secondary | ICD-10-CM | POA: Diagnosis not present

## 2017-12-11 DIAGNOSIS — H938X1 Other specified disorders of right ear: Secondary | ICD-10-CM | POA: Insufficient documentation

## 2017-12-11 DIAGNOSIS — Z87442 Personal history of urinary calculi: Secondary | ICD-10-CM | POA: Diagnosis not present

## 2017-12-11 DIAGNOSIS — R0689 Other abnormalities of breathing: Secondary | ICD-10-CM | POA: Insufficient documentation

## 2017-12-11 LAB — POCT URINALYSIS DIP (DEVICE)
Bilirubin Urine: NEGATIVE
Glucose, UA: NEGATIVE mg/dL
Hgb urine dipstick: NEGATIVE
Ketones, ur: NEGATIVE mg/dL
Leukocytes, UA: NEGATIVE
Nitrite: NEGATIVE
Protein, ur: NEGATIVE mg/dL
Specific Gravity, Urine: 1.015 (ref 1.005–1.030)
Urobilinogen, UA: 0.2 mg/dL (ref 0.0–1.0)
pH: 7.5 (ref 5.0–8.0)

## 2017-12-11 LAB — POCT RAPID STREP A: Streptococcus, Group A Screen (Direct): NEGATIVE

## 2017-12-11 MED ORDER — FLUTICASONE PROPIONATE 50 MCG/ACT NA SUSP: 2.0000 | Freq: Every day | NASAL | 0 refills | Status: DC

## 2017-12-11 MED ORDER — LIDOCAINE VISCOUS HCL 2 % MT SOLN: 15.0000 mL | OROMUCOSAL | 0 refills | Status: DC | PRN

## 2017-12-11 MED ORDER — IPRATROPIUM BROMIDE 0.06 % NA SOLN: 2.0000 | Freq: Four times a day (QID) | NASAL | 0 refills | Status: DC

## 2017-12-11 MED FILL — LIDOCAINE 2% VISCOUS SOLN: 2 | 7 days supply | Qty: 100 | Fill #0

## 2017-12-11 MED FILL — FLUTICASONE PROP 50 MCG SPR: 50 | 30 days supply | Qty: 16 | Fill #0

## 2017-12-11 MED FILL — IPRATROPIUM 0.06% SPRAY: 0.06 | 21 days supply | Qty: 15 | Fill #0

## 2017-12-11 NOTE — ED Provider Notes (Signed)
[facility]    CSN: [id number] Arrival date & time: 12/11/17  1011     History   Chief Complaint Chief Complaint  Patient presents with  . Sore Throat  . Dysuria    HPI Shannon Swanson is a 47 y.o. female.   47 year old female comes in for multiple complaints.  5-day history of dysuria, frequency, urgency.  Has some low abdominal pressure without obvious pain.  Nausea without vomiting.  Denies diarrhea.  Denies fever, chills, night sweats.  States has history of kidney stones, last episode 2 months ago, but states symptoms feels different.  3-day history of sore throat.  States also started having bilateral frontal headache, voice hoarseness.  Has mild cough.  Denies rhinorrhea, nasal congestion.  States also has a 3-week history of right ear pressure without pain.  She tried ibuprofen, apple cider/honey without relief.     Past Medical History:  Diagnosis Date  . Attention deficit disorder (ADD)   . GERD (gastroesophageal reflux disease)     Patient Active Problem List   Diagnosis Date Noted  . Dyspnea 08/28/2015  . Chest pain, atypical 08/28/2015  . B12 deficiency 11/26/2014  . Contusion of leg, right 08/21/2014  . LBP (low back pain) 06/19/2014  . Generalized anxiety disorder 06/19/2014  . Routine health maintenance 10/07/2013  . PALPITATIONS, OCCASIONAL 10/02/2008  . Mitral valve disorder 06/01/2007    Past Surgical History:  Procedure Laterality Date  . CERVICAL DISCECTOMY  2007   anterior approach 2007 Dr Saintclair Halsted   . TONSILLECTOMY      OB History   None      Home Medications    Prior to Admission medications   Medication Sig Start Date End Date Taking? Authorizing Provider  escitalopram (LEXAPRO) 5 MG tablet Take 1 tablet (5 mg total) by mouth daily. 08/28/15  Yes Plotnikov, Evie Lacks, MD  aspirin 81 MG chewable tablet Chew 324 mg by mouth daily.    [provider]  Cyanocobalamin 1000 MCG SUBL Place 1 tablet (1,000 mcg total)  under the tongue daily. 11/26/14   Plotnikov, Evie Lacks, MD  fluticasone (FLONASE) 50 MCG/ACT nasal spray Place 2 sprays into both nostrils daily. 12/11/17   Tasia Catchings, Katilynn Sinkler V, PA-C  ipratropium (ATROVENT) 0.06 % nasal spray Place 2 sprays into both nostrils 4 (four) times daily. 12/11/17   Tasia Catchings, Grae Leathers V, PA-C  lactase (LACTAID) 3000 UNITS tablet Take 1 tablet by mouth 3 (three) times daily with meals.    [provider]  lidocaine (XYLOCAINE) 2 % solution Use as directed 15 mLs in the mouth or throat as needed for mouth pain. 12/11/17   Tasia Catchings, Elenora Hawbaker V, PA-C  tamsulosin (FLOMAX) 0.4 MG CAPS capsule Take 1 capsule (0.4 mg total) by mouth daily. 10/25/17   Robyn Haber, MD    Family History Family History  Problem Relation Age of Onset  . Emphysema Father   . Diabetes Mother   . Mitral valve prolapse Mother   . Arthritis Mother     Social History Social History   Tobacco Use  . Smoking status: Never Smoker  . Smokeless tobacco: Never Used  Substance Use Topics  . Alcohol use: No  . Drug use: No     Allergies   Codeine and Meperidine hcl   Review of Systems Review of Systems  Reason unable to perform ROS: See HPI as above.     Physical Exam Triage Vital Signs ED Triage Vitals  Enc Vitals Group  BP 12/11/17 1054 130/84     Pulse Rate 12/11/17 1054 76     Resp --      Temp 12/11/17 1054 98.4 F (36.9 C)     Temp src --      SpO2 12/11/17 1054 100 %     Weight --      Height --      Head Circumference --      Peak Flow --      Pain Score 12/11/17 1052 3     Pain Loc --      Pain Edu? --      Excl. in [place]? --    No data found.  Updated Vital Signs BP 130/84   Pulse 76   Temp 98.4 F (36.9 C)   SpO2 100%   Physical Exam  Constitutional: She is oriented to person, place, and time. She appears well-developed and well-nourished.  Non-toxic appearance. She does not appear ill. No distress.  HENT:  Head: Normocephalic and atraumatic.  Right Ear: External ear and ear  canal normal. Tympanic membrane is erythematous. Tympanic membrane is not bulging.  Left Ear: Tympanic membrane, external ear and ear canal normal. Tympanic membrane is not erythematous and not bulging.  Nose: Nose normal. Right sinus exhibits no maxillary sinus tenderness and no frontal sinus tenderness. Left sinus exhibits no maxillary sinus tenderness and no frontal sinus tenderness.  Mouth/Throat: Uvula is midline and mucous membranes are normal. Posterior oropharyngeal erythema present.  Eyes: Pupils are equal, round, and reactive to light. Conjunctivae are normal.  Neck: Normal range of motion. Neck supple.  Cardiovascular: Normal rate, regular rhythm and normal heart sounds. Exam reveals no gallop and no friction rub.  No murmur heard. Pulmonary/Chest: Effort normal and breath sounds normal. No stridor. No respiratory distress. She has no decreased breath sounds. She has no wheezes. She has no rhonchi. She has no rales.  Abdominal: Soft. Bowel sounds are normal. She exhibits no mass. There is no tenderness. There is no rebound, no guarding and no CVA tenderness.  Lymphadenopathy:    She has no cervical adenopathy.  Neurological: She is alert and oriented to person, place, and time.  Skin: Skin is warm and dry.  Psychiatric: She has a normal mood and affect. Her behavior is normal. Judgment normal.     UC Treatments / Results  Labs (all labs ordered are listed, but only abnormal results are displayed) Labs Reviewed  CULTURE, GROUP A STREP [facility])  URINE CULTURE  POCT URINALYSIS DIP (DEVICE)  POCT RAPID STREP A    EKG None  Radiology No results found.  Procedures Procedures (including critical care time)  Medications Ordered in UC Medications - No data to display  Initial Impression / Assessment and Plan / UC Course  I have reviewed the triage vital signs and the nursing notes.  Pertinent labs & imaging results that were available during my care of the patient were  reviewed by me and considered in my medical decision making (see chart for details).    Urine negative for UTI.  Will send for urine culture.  Push fluids.  Return precautions given.  Rapid strep negative.  Right ear with erythematous TM.  Symptomatic treatment discussed.  Return precautions given.  Patient expresses understanding and agrees to plan.  Final Clinical Impressions(s) / UC Diagnoses   Final diagnoses:  Pharyngitis, unspecified etiology  Ear fullness, right    ED Prescriptions    Medication Sig Dispense Auth. Provider   fluticasone (  FLONASE) 50 MCG/ACT nasal spray Place 2 sprays into both nostrils daily. 1 g Kasumi Ditullio V, PA-C   ipratropium (ATROVENT) 0.06 % nasal spray Place 2 sprays into both nostrils 4 (four) times daily. 15 mL Norbert Malkin V, PA-C   lidocaine (XYLOCAINE) 2 % solution Use as directed 15 mLs in the mouth or throat as needed for mouth pain. 100 mL Tobin Chad, [place] 12/11/17 1142

## 2017-12-11 NOTE — ED Triage Notes (Addendum)
Pt reports burning with urination that started on Thursday.  She was treated for possible kidney stones in April and felt the same way.  On Saturday she developed a sore throat.  Pt denies any fever.

## 2017-12-11 NOTE — Discharge Instructions (Signed)
Urine negative for UTI, sent for culture. Keep hydrated, your urine should be clear to pale yellow in color. Monitor for any worsening of symptoms, fever, worsening abdominal pain, nausea/vomiting, flank pain, follow up for reevaluation.   Rapid strep negative. Lidocaine for sore throat. Do not eat/drink 30-64mins after use as it can stunt your gag reflex. Flonase, atrovent nasal spray for nasal congestion/drainage. You can use over the counter nasal saline rinse such as neti pot for nasal congestion. Monitor for any worsening of symptoms, swelling of the throat, trouble breathing, trouble swallowing, follow up for reevaluation.   For sore throat try using a honey-based tea. Use 3 teaspoons of honey with juice squeezed from half lemon. Place shaved pieces of ginger into 1/2-1 cup of water and warm over stove top. Then mix the ingredients and repeat every 4 hours as needed.

## 2017-12-12 LAB — URINE CULTURE

## 2017-12-13 LAB — CULTURE, GROUP A STREP (THRC)

## 2017-12-18 ENCOUNTER — Encounter

## 2017-12-18 ENCOUNTER — Other Ambulatory Visit (INDEPENDENT_AMBULATORY_CARE_PROVIDER_SITE_OTHER): Payer: 59

## 2017-12-18 ENCOUNTER — Encounter: Payer: Self-pay | Admitting: Internal Medicine

## 2017-12-18 ENCOUNTER — Ambulatory Visit (INDEPENDENT_AMBULATORY_CARE_PROVIDER_SITE_OTHER): Payer: 59 | Admitting: Internal Medicine

## 2017-12-18 VITALS — BP 112/70 | HR 72 | Ht 67.75 in | Wt 158.5 lb

## 2017-12-18 DIAGNOSIS — R6881 Early satiety: Secondary | ICD-10-CM

## 2017-12-18 DIAGNOSIS — K589 Irritable bowel syndrome without diarrhea: Secondary | ICD-10-CM

## 2017-12-18 DIAGNOSIS — R05 Cough: Secondary | ICD-10-CM | POA: Diagnosis not present

## 2017-12-18 DIAGNOSIS — R059 Cough, unspecified: Secondary | ICD-10-CM

## 2017-12-18 LAB — IGA: IgA: 180 mg/dL (ref 68–378)

## 2017-12-18 MED ORDER — METHYLPREDNISOLONE 4 MG PO TBPK: ORAL_TABLET | ORAL | 0 refills | Status: DC

## 2017-12-18 MED FILL — METHYLPREDNISOLONE 4 MG TAB: 4 | 6 days supply | Qty: 21 | Fill #0

## 2017-12-18 NOTE — Progress Notes (Signed)
Patient ID: Shannon Swanson, female   DOB: 1971/04/30, 47 y.o.   MRN: [id number] HPI: Shannon Swanson is a 47 year old female with a past medical history of anxiety, ADD, kidney stones who seen in consultation at the request of Shannon Reining, PA-C to evaluate possible reflux disease and also possible irritable bowel syndrome.  She is here today with her husband.  When the appointment was made she was having issues with eating and shortness of breath associated with eating.  She has a somewhat difficult time describing the sensation that she was feeling but this has occurred for 5 or 6 episodes over the last several years.  When symptoms are present they can last days to a week or so.  She describes that she will feel early fullness despite the fact that she is hungry.  This generally happens after taking just a few small bites of her meal.  She has no dysphagia or odynophagia but feels early fullness and then trouble taking a deep breath.  On further questioning this does not sound like a food impaction as she is able to drink liquids and handle her secretions.  No associated nausea or vomiting.  No associated cough.  She feels the need to sit up straight to help relieve symptoms and also facilitate deep breathing.  She denies feeling heartburn but does use Tums on occasion for gas and bloating type symptoms after eating.  She uses Lactaid tablets with meals containing lactose.  Without these she will feel abdominal pain, gas and bloating.  She reports her bowel movements as regular curling daily to every other day.  No diarrhea, constipation, blood in her stool or melena.  She states that time she does have abdominal tenderness with palpation but no pain or tenderness without palpation.  Shannon Swanson recommended during her last episode in March 2019 that she try Pepcid which she believes she did for about a week.  She is not sure if the episode ran its course or if the medication helped.  She  did not try Prilosec or other PPI.  She has a history of kidney stones last in April 2019.  She has tried to cut out carbonated beverages and also tea though she did not drink tea much anyway.  No family history of colon cancer or GI malignancy.  Her father had peptic ulcer disease and diverticulitis.  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  No tobacco use.  She does take methylphenidate and Lexapro.  Her symptoms as described above predate these medicines.  He has had previous imaging both of the abdomen pelvis with CT scan but also a high-resolution CT scan of her chest which was unremarkable.  Recently she is getting over an upper respiratory infection which she felt was viral.  She has had a lingering cough.  She has not tried anything for cough other than cough drops.  She has used her son's albuterol inhaler several times with benefit.  Mild productive cough at times but mostly dry cough.  Sore throat has resolved.   Past Medical History:  Diagnosis Date  . Anxiety   . Attention deficit disorder (ADD)   . GERD (gastroesophageal reflux disease)   . Kidney stones     Past Surgical History:  Procedure Laterality Date  . CERVICAL DISCECTOMY  2007   anterior approach 2007 Dr Saintclair Halsted   . TONSILLECTOMY      Outpatient Medications Prior to Visit  Medication Sig Dispense Refill  .  Cyanocobalamin 1000 MCG SUBL Place 1 tablet (1,000 mcg total) under the tongue daily. 100 tablet 11  . escitalopram (LEXAPRO) 10 MG tablet Take 1 tablet by mouth daily.  0  . fluticasone (FLONASE) 50 MCG/ACT nasal spray Place 2 sprays into both nostrils daily. 1 g 0  . ipratropium (ATROVENT) 0.06 % nasal spray Place 2 sprays into both nostrils 4 (four) times daily. 15 mL 0  . lactase (LACTAID) 3000 UNITS tablet Take 1 tablet by mouth 3 (three) times daily with meals.    . methylphenidate (METADATE ER) 20 MG ER tablet Take 1 tablet by mouth as needed.     . methylphenidate (RITALIN) 5 MG tablet  Take 1 tablet by mouth as needed.  0  . aspirin 81 MG chewable tablet Chew 324 mg by mouth daily.    Marland Kitchen escitalopram (LEXAPRO) 5 MG tablet Take 1 tablet (5 mg total) by mouth daily. 30 tablet 5  . lidocaine (XYLOCAINE) 2 % solution Use as directed 15 mLs in the mouth or throat as needed for mouth pain. 100 mL 0  . tamsulosin (FLOMAX) 0.4 MG CAPS capsule Take 1 capsule (0.4 mg total) by mouth daily. 10 capsule 0   No facility-administered medications prior to visit.     Allergies  Allergen Reactions  . Codeine   . Meperidine Hcl     Family History  Problem Relation Age of Onset  . Emphysema Father   . Diabetes Mother   . Mitral valve prolapse Mother   . Arthritis Mother     Social History   Tobacco Use  . Smoking status: Never Smoker  . Smokeless tobacco: Never Used  Substance Use Topics  . Alcohol use: No  . Drug use: No    ROS: As per history of present illness, otherwise negative  Ht 5' 7.75" (1.721 m) Comment: heightmeasured without shoes  Wt 158 lb 8 oz (71.9 kg)   BMI 24.28 kg/m  Constitutional: Well-developed and well-nourished. No distress. HEENT: Normocephalic and atraumatic. Conjunctivae are normal.  No scleral icterus. Neck: Neck supple. Trachea midline. Cardiovascular: Normal rate, regular rhythm and intact distal pulses. No M/R/G Pulmonary/chest: Effort normal and breath sounds normal. No wheezing, rales or rhonchi. Abdominal: Soft, nontender, nondistended. Bowel sounds active throughout. There are no masses palpable. No hepatosplenomegaly. Extremities: no clubbing, cyanosis, or edema Neurological: Alert and oriented to person place and time. Skin: Skin is warm and dry.  Psychiatric: Normal mood and affect. Behavior is normal.  RELEVANT LABS AND IMAGING: CXR - done recently at PCP; report reviewed: normal Previous high-resolution CT of the chest --no evidence of ILD  Labs reviewed via care everywhere --dated March 2019; CBC normal, CMP within normal  limits, normal folate, normal B12, normal TSH  ASSESSMENT/PLAN:  47 year old female with a past medical history of anxiety, ADD, kidney stones who seen in consultation at the request of Shannon Mole, PA-C to evaluate possible reflux disease and also possible irritable bowel syndrome.   1.  Early fullness/dyspnea associated with eating --symptoms are sporadic and have not been a problem in the last 3 months.  Unclear etiology but could possibly relate to esophagitis.  She is not having symptoms now and so an upper endoscopy would likely be low yield.  We discussed this together today.  I have recommended that if symptoms return that she notify my office and also at that time try famotidine 20 mg twice daily.  If this is esophagitis then famotidine should help reduce the length  and severity of her episode.  If this continues to recur upper endoscopy is reasonable.  2.  IBS with gas and bloating --she very likely has a component of irritable bowel disease.  I am going to check a TTG antibody to ensure no evidence for celiac disease.  She can try IBgard OTC per box instructions on an as-needed basis for intermittent abdominal discomfort and gas and bloating.  3.  CRC screening --colonoscopy recommended next July for average risk colon cancer screening, age 15  4.  Post viral cough --lungs are clear with no evidence for pneumonia or wheezing.  Medrol Dosepak.  Return to [facility] if not improved or if worsening.     JS:UNHRVAC, [address], [place], [place] [id number]

## 2017-12-18 NOTE — Patient Instructions (Signed)
We have sent the following medications to your pharmacy for you to pick up at your convenience: Medrol Dose pak- as directed  Please purchase the following medications over the counter and take as directed: IBGard as needed (for issues with eating/fullness)-Take pepcid 20 mg twice daily x 1 to 2 weeks...  If Pepcid still does not help improve your symptoms, call our office.  Your provider has requested that you go to the basement level for lab work before leaving today. Press "B" on the elevator. The lab is located at the first door on the left as you exit the elevator.  Please follow up with Dr Hilarie Fredrickson in September 2019.

## 2017-12-19 LAB — TISSUE TRANSGLUTAMINASE, IGA: (tTG) Ab, IgA: 1 U/mL

## 2017-12-20 DIAGNOSIS — Z0184 Encounter for antibody response examination: Secondary | ICD-10-CM | POA: Diagnosis not present

## 2017-12-20 DIAGNOSIS — Z111 Encounter for screening for respiratory tuberculosis: Secondary | ICD-10-CM | POA: Diagnosis not present

## 2017-12-26 MED FILL — METHYLPHENIDATE ER 20 MG TA: 20 | 90 days supply | Qty: 90 | Fill #0

## 2017-12-26 MED FILL — ESCITALOPRAM 10 MG TABLET: 10 | 90 days supply | Qty: 90 | Fill #0

## 2017-12-26 MED FILL — METHYLPHENIDATE HCL 5 MG TA: 5 | 90 days supply | Qty: 180 | Fill #0

## 2018-01-16 DIAGNOSIS — Z0184 Encounter for antibody response examination: Secondary | ICD-10-CM | POA: Diagnosis not present

## 2018-01-31 DIAGNOSIS — Z23 Encounter for immunization: Secondary | ICD-10-CM | POA: Diagnosis not present

## 2018-02-12 DIAGNOSIS — F411 Generalized anxiety disorder: Secondary | ICD-10-CM | POA: Diagnosis not present

## 2018-02-15 ENCOUNTER — Encounter: Payer: Self-pay | Admitting: Internal Medicine

## 2018-03-19 DIAGNOSIS — Z23 Encounter for immunization: Secondary | ICD-10-CM | POA: Diagnosis not present

## 2018-04-19 MED FILL — ESCITALOPRAM 10 MG TABLET: 10 | 90 days supply | Qty: 90 | Fill #1

## 2018-04-29 DIAGNOSIS — F422 Mixed obsessional thoughts and acts: Secondary | ICD-10-CM | POA: Insufficient documentation

## 2018-04-29 DIAGNOSIS — F900 Attention-deficit hyperactivity disorder, predominantly inattentive type: Secondary | ICD-10-CM

## 2018-04-29 DIAGNOSIS — G47419 Narcolepsy without cataplexy: Secondary | ICD-10-CM | POA: Insufficient documentation

## 2018-05-09 ENCOUNTER — Ambulatory Visit: Payer: 59 | Admitting: Psychiatry

## 2018-05-09 ENCOUNTER — Encounter: Payer: Self-pay | Admitting: Psychiatry

## 2018-05-09 VITALS — BP 118/76 | HR 78 | Ht 68.0 in | Wt 156.0 lb

## 2018-05-09 DIAGNOSIS — F422 Mixed obsessional thoughts and acts: Secondary | ICD-10-CM | POA: Diagnosis not present

## 2018-05-09 DIAGNOSIS — F900 Attention-deficit hyperactivity disorder, predominantly inattentive type: Secondary | ICD-10-CM

## 2018-05-09 DIAGNOSIS — F411 Generalized anxiety disorder: Secondary | ICD-10-CM

## 2018-05-09 MED ORDER — ESCITALOPRAM OXALATE 10 MG PO TABS: 15.0000 mg | ORAL_TABLET | Freq: Every day | ORAL | 0 refills | Status: DC

## 2018-05-09 MED ORDER — METHYLPHENIDATE HCL 5 MG PO TABS: 5.0000 mg | ORAL_TABLET | Freq: Two times a day (BID) | ORAL | 0 refills | Status: DC

## 2018-05-09 MED ORDER — METHYLPHENIDATE HCL ER 20 MG PO TBCR: 20.0000 mg | EXTENDED_RELEASE_TABLET | ORAL | 0 refills | Status: DC

## 2018-05-09 NOTE — Progress Notes (Signed)
[name]  Patient ID: Shannon Swanson,  MRN: [id number]  PCP: [name], MD  Date of Evaluation: 05/09/2018 Time spent:20 minutes  Chief Complaint:  Chief Complaint    Anxiety; ADHD      HISTORY/CURRENT STATUS: Shannon Swanson is seen individually face-to-face with consent not collateral for psychiatric interview and exam in 20-month evaluation and management of anxiety and ADHD.  Patient is more genuine today though still suppressing her affect about husband's metastatic bone cancer.  She formulates that husband is the structure and executive function of the family, and she is stressed when he has bone pain and no one can help.  Though she prays for a miracle, she is reverent to [employer] will.  She integrates her coping with their 2 children who come here for care.  She had her 28th wedding anniversary last week with husband.  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  She asks if her Lexapro might cause anxiety as she will sometimes have anxiety several hours after the dose though without direct side effect correlation obvious.  She is always hesitant to increase her medication or make other changes.  However she takes less methylphenidate as she is conservative about medication, though she is now willing to consider increasing Lexapro.  Anxiety  Presents for follow-up visit. Symptoms include compulsions, decreased concentration, excessive worry, insomnia, nervous/anxious behavior and obsessions. Patient reports no chest pain, confusion, depressed mood, dizziness, dry mouth, feeling of choking, hyperventilation, impotence, irritability, malaise, muscle tension, nausea, palpitations, panic, restlessness, shortness of breath or suicidal ideas. Symptoms occur most days. The severity of symptoms is moderate. The patient sleeps 4 hours per night. The quality of sleep is fair. Nighttime awakenings: one to  two.   Compliance with medications is 26-50%.    Individual Medical History/ Review of Systems: Changes? :No   Allergies: Codeine and Meperidine hcl  Current Medications:  Current Outpatient Medications:  .  escitalopram (LEXAPRO) 10 MG tablet, Take 1.5 tablets (15 mg total) by mouth at bedtime., Disp: 135 tablet, Rfl: 0 .  Cyanocobalamin 1000 MCG SUBL, Place 1 tablet (1,000 mcg total) under the tongue daily., Disp: 100 tablet, Rfl: 11 .  fluticasone (FLONASE) 50 MCG/ACT nasal spray, Place 2 sprays into both nostrils daily., Disp: 1 g, Rfl: 0 .  ipratropium (ATROVENT) 0.06 % nasal spray, Place 2 sprays into both nostrils 4 (four) times daily., Disp: 15 mL, Rfl: 0 .  lactase (LACTAID) 3000 UNITS tablet, Take 1 tablet by mouth 3 (three) times daily with meals., Disp: , Rfl:  .  methylphenidate (METADATE ER) 20 MG ER tablet, Take 1 tablet (20 mg total) by mouth every morning., Disp: 90 tablet, Rfl: 0 .  methylphenidate (RITALIN) 5 MG tablet, Take 1 tablet (5 mg total) by mouth 2 (two) times daily., Disp: 180 tablet, Rfl: 0 .  methylPREDNISolone (MEDROL DOSEPAK) 4 MG TBPK tablet, Take as directed, Disp: 21 tablet, Rfl: 0 Medication Side Effects: anxiety  Family Medical/ Social History: Changes? Yes son in college is more definitive about his OCD and mood treatment while daughter is doing well with her anxiety.  Husband does the best of although having the most severe problem.  MENTAL HEALTH EXAM: Eyeglasses, cervical and lumbar disc disease, birth control pill, and opiate sensitivity with 3 systems negative. Blood pressure 118/76, pulse 78, height 5\' 8"  (1.727 m), weight 156 lb (70.8 kg).Body mass index is 23.72 kg/m.  General Appearance: Casual and Fairly Groomed  Eye Contact:  Fair  Speech:  Clear and Coherent and Tearful today  Volume:  Normal  Mood:  Anxious and Dysphoric  Affect:  Full Range and Tearful  Thought Process:  Goal Directed  Orientation:  Full (Time, Place, and Person)   Thought Content: Obsessions   Suicidal Thoughts:  No  Homicidal Thoughts:  No  Memory:  Immediate;   Fair  Judgement:  Good  Insight:  Fair  Psychomotor Activity:  Increased  Concentration:  Concentration: Fair and Attention Span: Fair  Recall:  Good  Fund of Knowledge: Good  Language: Good  Assets:  Desire for Improvement Financial Resources/Insurance Housing  ADL's:  Intact  Cognition: WNL  Prognosis:  Good    DIAGNOSES:    ICD-10-CM   1. Mixed obsessional thoughts and acts F42.2 escitalopram (LEXAPRO) 10 MG tablet  2. Generalized anxiety disorder F41.1 escitalopram (LEXAPRO) 10 MG tablet  3. ADHD, predominantly inattentive type F90.0 methylphenidate (METADATE ER) 20 MG ER tablet    methylphenidate (RITALIN) 5 MG tablet    Receiving Psychotherapy: No    RECOMMENDATIONS: Lexapro is increased to 10 mg tablet taking 1-1/2 tablets nightly as #135 with no refill.  Though she is taking less methylphenidate, she is currently low in supply and has none in the pharmacy.  Methylphenidate 20 mg ER 1 every morning #90 and Ritalin 5 mg IR twice daily #180 are sent to [facility] along with the Lexapro to return in 3 months, planning possible need in the interim to advance the Lexapro to a full 20 mg nightly integrating all aspects of education, work and family responsibilities.   Delight Hoh, MD

## 2018-05-17 ENCOUNTER — Other Ambulatory Visit: Payer: Self-pay | Admitting: Family Medicine

## 2018-05-17 ENCOUNTER — Ambulatory Visit: Payer: Self-pay

## 2018-05-17 DIAGNOSIS — M542 Cervicalgia: Secondary | ICD-10-CM

## 2018-06-19 DIAGNOSIS — Z23 Encounter for immunization: Secondary | ICD-10-CM | POA: Diagnosis not present

## 2018-07-31 MED FILL — METHYLPHENIDATE HCL ER 20 M: 20 | 90 days supply | Qty: 90 | Fill #0

## 2018-08-08 ENCOUNTER — Encounter: Payer: Self-pay | Admitting: Psychiatry

## 2018-08-08 ENCOUNTER — Ambulatory Visit: Payer: 59 | Admitting: Psychiatry

## 2018-08-08 VITALS — BP 108/68 | HR 84 | Ht 69.0 in | Wt 163.0 lb

## 2018-08-08 DIAGNOSIS — F900 Attention-deficit hyperactivity disorder, predominantly inattentive type: Secondary | ICD-10-CM

## 2018-08-08 DIAGNOSIS — F411 Generalized anxiety disorder: Secondary | ICD-10-CM

## 2018-08-08 DIAGNOSIS — F422 Mixed obsessional thoughts and acts: Secondary | ICD-10-CM | POA: Diagnosis not present

## 2018-08-08 NOTE — Progress Notes (Signed)
[name]  Patient ID: Shannon Swanson,  MRN: [id number]  PCP: [name], MD  Date of Evaluation: 08/08/2018 Time spent:20 minutes  Chief Complaint:  Chief Complaint    ADHD; Anxiety      HISTORY/CURRENT STATUS: Shannon Swanson is seen individually face-to-face with consent not collateral for psychiatric interview and exam in 69-month evaluation and management of OCD/ADHD and GAD with provisional narcolepsy.  She has gained 7 pounds in the interim likely holiday related having no adverse effects from medication but often noncompliant without consequence either, other than realizing the need for the medications when she resumes her full schedule now.  She did advance the Lexapro to 15 mg nightly, and she has 20 mg extended release and 5 mg twice daily immediate release methylphenidate for school and work.  She addresses all issues openly and realistically today even better than last session and has all family members needing help in care including here for those mental health treatment needs.  She has just filled her methylphenidate prescription from last appointment and prefers to defer a refill on Lexapro until she is consistently taking it this winter in order to determine the to continue 1-1/2 of the 10 mg or advance to the 20 mg tablet at night.  Anxiety  Presents for follow-up visit. Symptoms include compulsions, decreased concentration, excessive worry, muscle tension, nervous/anxious behavior and obsessions. Patient reports no depressed mood, panic or suicidal ideas. The severity of symptoms is interfering with daily activities. The quality of sleep is fair. Nighttime awakenings: occasional.   Compliance with medications is 51-75%. Side effects of treatment include joint pain.    Individual Medical History/ Review of Systems: Changes? :Yes , Shannon Swanson has been noncompliant with Lexapro frequently during her 4 weeks of winter break from school at [employer] now restarting her final  semester to get her degree in May.  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  She has resumed using her extended release methylphenidate more in the morning and as needed the 5 mg IR up to twice daily for school and other responsibilities.  Despite noncompliance and her stressors, she is holding up emotionally though expecting she will break down when husband dies from cancer unless miracle does occur as church members and her son Shannon Swanson expectantly pray.  Shannon Swanson is sleeping more without reporting more depression or physical exhaustion.  Allergies: Codeine and Meperidine hcl  Current Medications:  Current Outpatient Medications:  .  Cyanocobalamin 1000 MCG SUBL, Place 1 tablet (1,000 mcg total) under the tongue daily., Disp: 100 tablet, Rfl: 11 .  escitalopram (LEXAPRO) 10 MG tablet, Take 1.5 tablets (15 mg total) by mouth at bedtime., Disp: 135 tablet, Rfl: 0 .  fluticasone (FLONASE) 50 MCG/ACT nasal spray, Place 2 sprays into both nostrils daily., Disp: 1 g, Rfl: 0 .  ipratropium (ATROVENT) 0.06 % nasal spray, Place 2 sprays into both nostrils 4 (four) times daily., Disp: 15 mL, Rfl: 0 .  lactase (LACTAID) 3000 UNITS tablet, Take 1 tablet by mouth 3 (three) times daily with meals., Disp: , Rfl:  .  methylphenidate (METADATE ER) 20 MG ER tablet, Take 1 tablet (20 mg total) by mouth every morning., Disp: 90 tablet, Rfl: 0 .  methylphenidate (RITALIN) 5  MG tablet, Take 1 tablet (5 mg total) by mouth 2 (two) times daily., Disp: 180 tablet, Rfl: 0 .  methylPREDNISolone (MEDROL DOSEPAK) 4 MG TBPK tablet, Take as directed, Disp: 21 tablet, Rfl: 0   Medication Side Effects: none  Family Medical/ Social History: Changes? Yes, having hospital bed,  wheelchair, and oxygen at home for her husband Shannon Swanson, though he uses these only on down days.  Shannon Swanson was given 2 to 4 months to live 2 months ago by his oncologist to continue his radiation therapy prn for lesion in the neck obstructing airway and potentially soon in the back where more bone pain is occurring.  Husband is realistic about problems, though patient daydreams of an eventual movie about their family in which the husband's bone cancer resolved and his leg grew back.  Oldest adoptive daughter is pregnant due in May patient needing to be there for delivery at the time patient is expected to graduate. Oldest biological daughter is to marry in March father to perform the ceremony with fianc visiting father today to get his consent to marry.  Son Shannon Swanson is home for semester online from his seminary education in [place], and daughter Shannon Swanson will graduate from high school in June.  MENTAL HEALTH EXAM: Muscle strength 5/5, postural reflexes 0/0 and AIMS equals 0 Blood pressure 108/68, pulse 84, height 5\' 9"  (1.753 m), weight 163 lb (73.9 kg).Body mass index is 24.07 kg/m.  General Appearance: Casual and Well Groomed  Eye Contact:  Good  Speech:  Clear and Coherent, Normal Rate and Talkative  Volume:  Normal  Mood:  Anxious, Dysphoric and Euthymic  Affect:  Appropriate, Full Range and Anxious  Thought Process:  Goal Directed and Linear  Orientation:  Full (Time, Place, and Person)  Thought Content: Obsessions and Rumination   Suicidal Thoughts:  No  Homicidal Thoughts:  No  Memory:  Immediate;   Good Remote;   Good  Judgement:  Good  Insight:  Fair  Psychomotor Activity:  Normal  Concentration:  Concentration: Good and Attention Span: Fair  Recall:  Good  Fund of Knowledge: Good  Language: Good  Assets:  Desire for Improvement Leisure Time Talents/Skills Vocational/Educational  ADL's:  Intact  Cognition: WNL  Prognosis:  Good    DIAGNOSES:    ICD-10-CM   1. Mixed obsessional  thoughts and acts F42.2   2. Generalized anxiety disorder F41.1   3. ADHD, predominantly inattentive type F90.0     Receiving Psychotherapy: No    RECOMMENDATIONS: Over 50% of the time is spent in counseling and coordination of care for the treatment of herself and two children here especially relative to husband's terminal bone cancer and any other responsibilities for family, work and school for [employer].  We reestablished expectation for compliance with her medications from here forth for the next 6 months to include Lexapro 10 mg tablet taking 1-1/2 tablets every bedtime and current supply to refill way to assess timely compliance with dosing for OCD and GAD.  Similarly for her ADHD inattentive type, she continues methylphenidate 20 mg ER every morning and 5 mg IR twice daily appropriate to work and school projects and tests also having current supply obtaining 90-day fills at her hospital outpatient pharmacy. She does an excellent job today of opening up regarding all issues and formulating an approach of her own to help and to cope planning to return in 3 months or sooner for any additional emotional or learning based needs.   Delight Hoh, MD

## 2018-09-17 ENCOUNTER — Other Ambulatory Visit (HOSPITAL_COMMUNITY): Payer: Self-pay | Admitting: Family Medicine

## 2018-09-17 ENCOUNTER — Other Ambulatory Visit: Payer: Self-pay | Admitting: Family Medicine

## 2018-09-17 ENCOUNTER — Ambulatory Visit: Payer: Self-pay

## 2018-09-17 DIAGNOSIS — M25511 Pain in right shoulder: Secondary | ICD-10-CM

## 2018-09-18 ENCOUNTER — Other Ambulatory Visit (HOSPITAL_COMMUNITY): Payer: Self-pay | Admitting: Family Medicine

## 2018-09-18 DIAGNOSIS — M25511 Pain in right shoulder: Secondary | ICD-10-CM

## 2018-09-21 ENCOUNTER — Encounter (HOSPITAL_COMMUNITY): Payer: Self-pay

## 2018-09-21 ENCOUNTER — Ambulatory Visit (HOSPITAL_COMMUNITY): Payer: Self-pay

## 2018-09-22 ENCOUNTER — Ambulatory Visit (HOSPITAL_COMMUNITY)
Admission: RE | Admit: 2018-09-22 | Discharge: 2018-09-22 | Disposition: A | Discharge status: Home / Self Care | Payer: PRIVATE HEALTH INSURANCE | Source: Ambulatory Visit | Attending: Family Medicine | Admitting: Family Medicine

## 2018-09-22 ENCOUNTER — Other Ambulatory Visit: Payer: Self-pay

## 2018-09-22 DIAGNOSIS — M25511 Pain in right shoulder: Secondary | ICD-10-CM | POA: Insufficient documentation

## 2018-10-12 MED FILL — ESCITALOPRAM 10 MG TABLET: 10 | 90 days supply | Qty: 135 | Fill #0

## 2018-11-07 ENCOUNTER — Other Ambulatory Visit: Payer: Self-pay

## 2018-11-07 ENCOUNTER — Encounter: Payer: Self-pay | Admitting: Psychiatry

## 2018-11-07 ENCOUNTER — Ambulatory Visit (INDEPENDENT_AMBULATORY_CARE_PROVIDER_SITE_OTHER): Payer: 59 | Admitting: Psychiatry

## 2018-11-07 DIAGNOSIS — F411 Generalized anxiety disorder: Secondary | ICD-10-CM | POA: Diagnosis not present

## 2018-11-07 DIAGNOSIS — F900 Attention-deficit hyperactivity disorder, predominantly inattentive type: Secondary | ICD-10-CM | POA: Diagnosis not present

## 2018-11-07 DIAGNOSIS — F422 Mixed obsessional thoughts and acts: Secondary | ICD-10-CM

## 2018-11-07 MED ORDER — ESCITALOPRAM OXALATE 10 MG PO TABS: 15.0000 mg | ORAL_TABLET | Freq: Every day | ORAL | 0 refills | Status: DC

## 2018-11-07 MED ORDER — METHYLPHENIDATE HCL ER 20 MG PO TBCR: 20.0000 mg | EXTENDED_RELEASE_TABLET | Freq: Every day | ORAL | 0 refills | Status: DC

## 2018-11-07 MED ORDER — METHYLPHENIDATE HCL 5 MG PO TABS: 5.0000 mg | ORAL_TABLET | Freq: Two times a day (BID) | ORAL | 0 refills | Status: DC

## 2018-11-07 MED FILL — METHYLPHENIDATE HCL ER 20 M: 20 | 90 days supply | Qty: 90 | Fill #0

## 2018-11-07 MED FILL — METHYLPHENIDATE HCL 5 MG TA: 5 | 90 days supply | Qty: 180 | Fill #0

## 2018-11-07 NOTE — Progress Notes (Signed)
[name]  Patient ID: Shannon Swanson,  MRN: [id number]  PCP: [name], MD  Date of Evaluation: 11/07/2018 Time spent:20 minutes from 1100 to 1120  I connected with patient by a video enabled telemedicine application or telephone, with their informed consent, and verified patient privacy and that I am speaking with the correct person using two identifiers.  I was located at [employer] and patient individually outside home residence where her husband inside copes with bone cancer.   Chief Complaint:  Chief Complaint    Anxiety; ADHD; Stress      HISTORY/CURRENT STATUS: Jeraline is provided telemedicine audiovisual appointment session, as she declines video camera being in the backyard with husband coping with cancer in the house, with consent not collateral for psychiatric interview and exam in 47-month evaluation and management of OCD/ADHD and GAD while managing major family stressors.  Richel has completed the academics with high A's needed to graduate from [employer] in May though a couple of classes have not submitted the final grade.  Son is home from [employer] training in [place] to return in the fall all in prayer for husband [name] who has continued dissemination of his metastatic bone cancer on oxygen at night requiring transfusions this week, such that she sat 6 hours in the parking lot waiting to assist him with stay at home rules for coronavirus.  Daughter's marriage included 29 people before maximum social distancing rules still having to cancel some attendees.  Daughter-in-law is due to deliver June 5 in [place] all wanting to be there though likely not allowed at delivery.  She is keeping up at work though some shifts are reduced due to low census in the stay at home closures.  Metadate helps also when tired in the morning and she has the IR dose for maintaining attention and organization for work and family responsibilities.  She is not always compliant with  Lexapro, but anxiety is manageable including obsessional thoughts and acts.  She has no mania, psychosis, dissociation or substance use.  Anxiety  Presents for follow-up visit. Symptoms include compulsions, decreased concentration, excessive worry, nausea, nervous/anxious behavior and obsessions. Patient reports no depressed mood, insomnia, panic or suicidal ideas. Symptoms occur most days. The severity of symptoms is moderate. The quality of sleep is fair. Nighttime awakenings: occasional.   Compliance with medications is 51-75%. Side effects of treatment include GI discomfort.    Individual Medical History/ Review of Systems: Changes? :No   Allergies: Codeine and Meperidine hcl  Current Medications:  Current Outpatient Medications:  .  Cyanocobalamin 1000 MCG SUBL, Place 1 tablet (1,000 mcg total) under the tongue daily., Disp: 100 tablet, Rfl: 11 .  escitalopram (LEXAPRO) 10 MG tablet, Take 1.5 tablets (15 mg total) by mouth at bedtime., Disp: 135 tablet, Rfl: 0 .  fluticasone (FLONASE) 50 MCG/ACT nasal spray, Place 2 sprays into both nostrils daily., Disp: 1 g, Rfl: 0 .  ipratropium (ATROVENT) 0.06 % nasal spray, Place 2 sprays into both nostrils 4 (four) times daily., Disp: 15 mL, Rfl: 0 .  lactase (LACTAID) 3000 UNITS tablet, Take 1 tablet by mouth 3 (three) times daily with meals., Disp: , Rfl:  .  methylphenidate (METADATE ER) 20 MG ER tablet, Take 1 tablet (20 mg total) by mouth daily after breakfast., Disp: 90 tablet, Rfl: 0 .  methylphenidate (RITALIN) 5 MG tablet, Take 1 tablet (5 mg total) by mouth 2 (two) times daily., Disp: 180 tablet, Rfl: 0 .  methylPREDNISolone (MEDROL DOSEPAK) 4  MG TBPK tablet, Take as directed, Disp: 21 tablet, Rfl: 0   Medication Side Effects: nausea taking methylphenidate on empty stomach  Family Medical/ Social History: Changes? Yes family schooling, employment, illness, marriage, and obstetric delivery date.  MENTAL HEALTH EXAM:  There were no  vitals taken for this visit.There is no height or weight on file to calculate BMI.  As not present here today  General Appearance: N/A  Eye Contact:  N/A  Speech:  Clear and Coherent, Normal Rate and Talkative  Volume:  Normal  Mood:  Anxious and Euthymic  Affect:  Appropriate, Full Range and Anxious  Thought Process:  Goal Directed and Linear  Orientation:  Full (Time, Place, and Person)  Thought Content: Obsessions and Rumination   Suicidal Thoughts:  No  Homicidal Thoughts:  No  Memory:  Immediate;   Good Remote;   Good  Judgement:  Good  Insight:  Good  Psychomotor Activity:  Normal, Decreased and Mannerisms  Concentration:  Concentration: Fair and Attention Span: Fair  Recall:  [employer] of Knowledge: Good  Language: Good  Assets:  Desire for Improvement Talents/Skills Vocational/Educational  ADL's:  Intact  Cognition: WNL  Prognosis:  Good    DIAGNOSES:    ICD-10-CM   1. Mixed obsessional thoughts and acts F42.2 escitalopram (LEXAPRO) 10 MG tablet  2. Generalized anxiety disorder F41.1 escitalopram (LEXAPRO) 10 MG tablet  3. ADHD, predominantly inattentive type F90.0 methylphenidate (METADATE ER) 20 MG ER tablet    methylphenidate (RITALIN) 5 MG tablet    Receiving Psychotherapy: No    RECOMMENDATIONS: Over 50% of the time is spent in counseling and coordination of care patient improving with each subsequent session in her capacity to mobilize exposure to content and affect for working through response prevention and problem solving.  She is E scribed Metadate 20 mg ER tablet every morning and Ritalin 5 mg IR twice daily as a 90-day supply each for ADHD to [place].  She is E scribed Lexapro 10 mg tablet taking 1.5 tablets total 15 mg every bedtime #135 tablets with no refill sent to [facility] outpatient.  She returns in 4 months or sooner if needed.  Virtual Visit via Video Note  I connected with [name] on 11/07/18 at 11:00 AM  EDT by a video enabled telemedicine application and verified that I am speaking with the correct person using two identifiers.   I discussed the limitations of evaluation and management by telemedicine and the availability of in person appointments. The patient expressed understanding and agreed to proceed.  History of Present Illness: 36-month evaluation and management addresses OCD/ADHD and GAD while managing major family stressors.  Gredmarie has completed the academics with high A's needed to graduate from [employer] in May though a couple of classes have not submitted the final grade.  Son is home from [employer] training in [place] to return in the fall all in prayer for husband [name] who has continued dissemination of his metastatic bone cancer on oxygen at night requiring transfusions this week, such that she sat 6 hours in the parking lot waiting to assist him with stay at home rules for coronavirus.  Daughter's marriage included 53 people before maximum social distancing rules still having to cancel some attendees.  Daughter-in-law is due to deliver June 5 in [place] all wanting to be there though likely not allowed at delivery.  She is keeping up at work.    Observations/Objective: Mood:  Anxious and Euthymic  Affect:  Appropriate, Full Range and Anxious  Thought Process:  Goal Directed and Linear    Assessment and Plan: Counseling and coordination of care patient improving with each subsequent session in her capacity to mobilize exposure to content and affect for working through response prevention and problem solving.  She is E scribed Metadate 20 mg ER tablet every morning and Ritalin 5 mg IR twice daily as a 90-day supply each for ADHD to [place].  She is E scribed Lexapro 10 mg tablet taking 1.5 tablets total 15 mg every bedtime #135 tablets with no refill sent to [facility] outpatient.   Follow Up Instructions: She returns in 4 months or sooner if needed.   I discussed  the assessment and treatment plan with the patient. The patient was provided Swanson opportunity to ask questions and all were answered. The patient agreed with the plan and demonstrated Swanson understanding of the instructions.   The patient was advised to call back or seek Swanson in-person evaluation if the symptoms worsen or if the condition fails to improve as anticipated.  I provided 20 minutes of non-face-to-face time during this encounter. [employer] meeting #[id number] Password: Farrel Conners, MD  Delight Hoh, MD

## 2018-11-28 ENCOUNTER — Other Ambulatory Visit (HOSPITAL_COMMUNITY): Payer: Self-pay | Admitting: Orthopedic Surgery

## 2018-11-28 ENCOUNTER — Other Ambulatory Visit: Payer: Self-pay | Admitting: Orthopedic Surgery

## 2018-11-28 DIAGNOSIS — M5410 Radiculopathy, site unspecified: Secondary | ICD-10-CM

## 2018-12-04 ENCOUNTER — Ambulatory Visit (HOSPITAL_COMMUNITY): Payer: PRIVATE HEALTH INSURANCE

## 2018-12-11 ENCOUNTER — Ambulatory Visit (HOSPITAL_COMMUNITY)
Admission: RE | Admit: 2018-12-11 | Discharge: 2018-12-11 | Disposition: A | Discharge status: Home / Self Care | Payer: PRIVATE HEALTH INSURANCE | Source: Ambulatory Visit | Attending: Orthopedic Surgery | Admitting: Orthopedic Surgery

## 2018-12-11 ENCOUNTER — Other Ambulatory Visit: Payer: Self-pay

## 2018-12-11 DIAGNOSIS — M5410 Radiculopathy, site unspecified: Secondary | ICD-10-CM | POA: Diagnosis not present

## 2018-12-14 MED FILL — SF 5000 PLUS CREAM: 1.1 | 30 days supply | Qty: 51 | Fill #0

## 2018-12-21 MED FILL — GABAPENTIN 250 MG/5ML SOLN: 250 | 30 days supply | Qty: 180 | Fill #0

## 2019-01-23 DIAGNOSIS — L255 Unspecified contact dermatitis due to plants, except food: Secondary | ICD-10-CM | POA: Diagnosis not present

## 2019-01-23 DIAGNOSIS — W57XXXA Bitten or stung by nonvenomous insect and other nonvenomous arthropods, initial encounter: Secondary | ICD-10-CM | POA: Diagnosis not present

## 2019-01-23 MED FILL — METHYLPREDNISOLONE 4 MG TBP: 4 | 6 days supply | Qty: 21 | Fill #0

## 2019-01-25 MED FILL — GABAPENTIN 250 MG/5ML SOLN: 250 | 30 days supply | Qty: 180 | Fill #0

## 2019-03-05 MED FILL — GABAPENTIN 250 MG/5ML SOLN: 250 | 30 days supply | Qty: 180 | Fill #0

## 2019-03-07 ENCOUNTER — Ambulatory Visit: Payer: 59 | Admitting: Psychiatry

## 2019-03-21 ENCOUNTER — Ambulatory Visit (INDEPENDENT_AMBULATORY_CARE_PROVIDER_SITE_OTHER): Payer: 59 | Admitting: Psychiatry

## 2019-03-21 ENCOUNTER — Encounter: Payer: Self-pay | Admitting: Psychiatry

## 2019-03-21 ENCOUNTER — Other Ambulatory Visit: Payer: Self-pay

## 2019-03-21 VITALS — Ht 69.0 in | Wt 164.0 lb

## 2019-03-21 DIAGNOSIS — F900 Attention-deficit hyperactivity disorder, predominantly inattentive type: Secondary | ICD-10-CM

## 2019-03-21 DIAGNOSIS — F411 Generalized anxiety disorder: Secondary | ICD-10-CM | POA: Diagnosis not present

## 2019-03-21 DIAGNOSIS — F422 Mixed obsessional thoughts and acts: Secondary | ICD-10-CM

## 2019-03-21 MED ORDER — ESCITALOPRAM OXALATE 10 MG PO TABS: 15.0000 mg | ORAL_TABLET | Freq: Every day | ORAL | 1 refills | Status: DC

## 2019-03-21 MED ORDER — METHYLPHENIDATE HCL ER 20 MG PO TBCR: 20.0000 mg | EXTENDED_RELEASE_TABLET | Freq: Every day | ORAL | 0 refills | Status: DC

## 2019-03-21 MED ORDER — METHYLPHENIDATE HCL 5 MG PO TABS: 5.0000 mg | ORAL_TABLET | Freq: Two times a day (BID) | ORAL | 0 refills | Status: DC

## 2019-03-21 MED FILL — METHYLPHENIDATE HCL 5 MG TA: 5 | 90 days supply | Qty: 180 | Fill #0

## 2019-03-21 MED FILL — ESCITALOPRAM 10 MG TABLET: 10 | 90 days supply | Qty: 135 | Fill #0

## 2019-03-21 MED FILL — METHYLPHENIDATE HCL ER 20 M: 20 | 90 days supply | Qty: 90 | Fill #0

## 2019-03-21 NOTE — Progress Notes (Signed)
[name]  Patient ID: Shannon Swanson,  MRN: [id number]  PCP: [name], MD  Date of Evaluation: 03/21/2019 Time spent:20 minutes 1110 to 1130  Chief Complaint:  Chief Complaint    Anxiety; ADHD      HISTORY/CURRENT STATUS: Shannon Swanson is seen onsite in office face-to-face individually with consent with epic collateral arriving 10 minutes late she attributes to caring for daughter [name] in distress and being caught in traffic for psychiatric interview and exam in 11-month evaluation and management of ADHD/OCD and GAD with major stressors.  The interim death of husband [name] from metastatic bone cancer occurred on Father's Day in June after birth of her grandson [name] June 5, patient unable to be at the delivery due to husband's illness.  She has seen the baby by video, while son [name] has returned to [place] to college and daughter [name] is starting her high school year.  She processes the difficulties of both daughter and son she must help and is aware that her requirement that I prescribe the medications for [name] to obtain through family insurance resources when medications are directed by a psychologist with [facility] is now limited as [name] did not come for his last appointment here necessary to continue his Ritalin E scribing of which she is aware, so that they may prefer a psychiatrist in [place]. Patient reviews family still going on the travels that [place] had planned for father as they all anticipated the miracle for [name] to recover similar to [name]'s muscular dystrophy symptoms in infancy resolved with miraculous prayer through their church.  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  She needs to continue her Ritalin and Metadate for ADHD while continuing Lexapro 15 mg nightly increased 10 months  ago for OCD and GAD as she attempts to manage all of these affairs herself but may convince daughter to return for care for here for anxiety disorder.  Patient has no suicidality, psychosis, delirium or mania.  Anxiety             Presents for follow-up visit. Symptoms include obsessive slowing and cognitions, decreased concentration, excessive worry, nervous/anxious behavior and avoidance. Patient reports no depressed mood, insomnia, panic or suicidal ideas. Symptoms occur most days. The severity of symptoms is moderate. The quality of sleep is fair. Nighttime awakenings: occasional.  Compliance with medications is 51-75%. Side effects of treatment include GI discomfort.  Individual Medical History/ Review of Systems: Changes? :Yes occupational right shoulder and neck pain.  Allergies: Codeine and Meperidine hcl  Current Medications:  Current Outpatient Medications:  .  Cyanocobalamin 1000 MCG SUBL, Place 1 tablet (1,000 mcg total) under the tongue daily., Disp: 100 tablet, Rfl: 11 .  escitalopram (LEXAPRO) 10 MG tablet, Take 1.5 tablets (15 mg total) by mouth at bedtime., Disp: 135 tablet, Rfl: 1 .  fluticasone (FLONASE) 50 MCG/ACT nasal spray, Place 2 sprays into both nostrils daily., Disp: 1 g, Rfl: 0 .  ipratropium (ATROVENT) 0.06 % nasal spray, Place 2 sprays into both nostrils 4 (four) times daily., Disp: 15 mL, Rfl: 0 .  lactase (LACTAID) 3000 UNITS tablet, Take 1 tablet by mouth 3 (three) times daily with meals., Disp: , Rfl:  .  methylphenidate (METADATE ER) 20 MG ER tablet, Take 1 tablet (20 mg total) by mouth daily after breakfast., Disp: 90 tablet, Rfl:  0 .  methylphenidate (RITALIN) 5 MG tablet, Take 1 tablet (5 mg total) by mouth 2 (two) times daily., Disp: 180 tablet, Rfl: 0 .  methylPREDNISolone (MEDROL DOSEPAK) 4 MG TBPK tablet, Take as directed, Disp: 21 tablet, Rfl: 0   Medication Side Effects: none  Family Medical/ Social History: Changes? Yes, as specifics of family  stressors and loss past and present may be compounded by son and daughter being seen here with husband's participation in their care so that therapy may be necessary to address such or possibility of transfer to an alternative divider in the office such as [name].  MENTAL HEALTH EXAM:  Height 5\' 9"  (1.753 m), weight 164 lb (74.4 kg).Body mass index is 24.22 kg/m. Muscle strengths and tone 5/5, postural reflexes and gait 0/0, and AIMS = 0.  Others deferred for coronavirus shutdown  General Appearance: Casual, Guarded, Meticulous and Well Groomed  Eye Contact:  Fair  Speech:  Blocked, Clear and Coherent, Garbled and Talkative  Volume:  Normal  Mood:  Anxious, Dysphoric and Euthymic  Affect:  Congruent, Restricted and Anxious  Thought Process:  Goal Directed, Irrelevant, Linear and Descriptions of Associations: Circumstantial  Orientation:  Full (Time, Place, and Person)  Thought Content: Logical, Obsessions and Rumination   Suicidal Thoughts:  No  Homicidal Thoughts:  No  Memory:  Immediate;   Good Remote;   Good  Judgement:  Good  Insight:  Fair  Psychomotor Activity:  Normal, Decreased and Mannerisms  Concentration:  Concentration: Good and Attention Span: Fair  Recall:  [employer] of Knowledge: Good  Language: Good  Assets:  Desire for Improvement Resilience Talents/Skills Vocational/Educational  ADL's:  Intact  Cognition: WNL  Prognosis:  Good    DIAGNOSES:    ICD-10-CM   1. Mixed obsessional thoughts and acts  F42.2 escitalopram (LEXAPRO) 10 MG tablet  2. Generalized anxiety disorder  F41.1 escitalopram (LEXAPRO) 10 MG tablet  3. ADHD, predominantly inattentive type  F90.0 methylphenidate (RITALIN) 5 MG tablet    methylphenidate (METADATE ER) 20 MG ER tablet    Receiving Psychotherapy: No   RECOMMENDATIONS: As patient only appoints here infrequently and has many traumatic associations possible with loss of husband, session must be devoted to an organized  hierarchy of stress management in her life for which she is taking responsibility.  Still she closes off at least part of the opportunity attending session late and working more on son and daughter than herself.  Cognitive behavioral grief and loss, sleep hygiene, and problem solving are updated along with medication prevention and monitoring and safety hygiene to conclude symptom treatment matching.  She is E scribed Lexapro 10 mg to take 1.5 tablets total 15 mg every bedtime sent as #135 with 1 refill to [place] for OCD and GAD.  She is E scribed Metadate 20 mg ER tablet every morning sent as #90 with no refill for ADHD to [facility].  She is E scribed Ritalin 5 mg IR twice daily for ADHD sent as #180 with no refill to [facility] for ADHD.  She is encouraged to return as she agrees to 4 months and sooner for daughter to be seen as possible.   Delight Hoh, MD

## 2019-04-05 MED FILL — GABAPENTIN 250 MG/5ML SOLN: 250 | 30 days supply | Qty: 180 | Fill #0

## 2019-04-18 MED FILL — METHYLPREDNISOLONE 4 MG TBP: 4 | 6 days supply | Qty: 21 | Fill #0

## 2019-04-19 ENCOUNTER — Encounter: Payer: Self-pay | Admitting: Physical Medicine & Rehabilitation

## 2019-05-06 ENCOUNTER — Encounter
Payer: PRIVATE HEALTH INSURANCE | Attending: Physical Medicine & Rehabilitation | Admitting: Physical Medicine & Rehabilitation

## 2019-05-06 ENCOUNTER — Encounter: Payer: Self-pay | Admitting: Physical Medicine & Rehabilitation

## 2019-05-06 ENCOUNTER — Other Ambulatory Visit: Payer: Self-pay

## 2019-05-06 VITALS — BP 147/90 | HR 82 | Temp 97.7°F | Ht 68.0 in | Wt 164.0 lb

## 2019-05-06 DIAGNOSIS — M79602 Pain in left arm: Secondary | ICD-10-CM | POA: Insufficient documentation

## 2019-05-06 DIAGNOSIS — R202 Paresthesia of skin: Secondary | ICD-10-CM | POA: Diagnosis present

## 2019-05-06 DIAGNOSIS — M79601 Pain in right arm: Secondary | ICD-10-CM | POA: Diagnosis not present

## 2019-05-06 NOTE — Patient Instructions (Signed)
Prelim findings are unremarkable , will write report and send to Dr Saintclair Halsted who will review this result in context of other  Test results

## 2019-05-06 NOTE — Progress Notes (Signed)
EMG/NCV BUE performed today please refer to full report under the media tab

## 2019-05-16 ENCOUNTER — Ambulatory Visit: Payer: 59 | Admitting: Physical Medicine & Rehabilitation

## 2019-05-24 MED FILL — GABAPENTIN 250 MG/5ML SOLN: 250 | 30 days supply | Qty: 180 | Fill #0

## 2019-05-29 ENCOUNTER — Other Ambulatory Visit: Payer: Self-pay | Admitting: Orthopedic Surgery

## 2019-06-11 DIAGNOSIS — Z01419 Encounter for gynecological examination (general) (routine) without abnormal findings: Secondary | ICD-10-CM | POA: Diagnosis not present

## 2019-06-11 DIAGNOSIS — Z008 Encounter for other general examination: Secondary | ICD-10-CM | POA: Diagnosis not present

## 2019-06-11 DIAGNOSIS — Z1231 Encounter for screening mammogram for malignant neoplasm of breast: Secondary | ICD-10-CM | POA: Diagnosis not present

## 2019-06-11 DIAGNOSIS — Z130 Encounter for screening for diseases of the blood and blood-forming organs and certain disorders involving the immune mechanism: Secondary | ICD-10-CM | POA: Diagnosis not present

## 2019-06-11 DIAGNOSIS — N951 Menopausal and female climacteric states: Secondary | ICD-10-CM | POA: Diagnosis not present

## 2019-06-11 DIAGNOSIS — Z6824 Body mass index (BMI) 24.0-24.9, adult: Secondary | ICD-10-CM | POA: Diagnosis not present

## 2019-06-11 HISTORY — PX: OTHER SURGICAL HISTORY: SHX169

## 2019-06-17 ENCOUNTER — Other Ambulatory Visit: Payer: Self-pay

## 2019-06-17 ENCOUNTER — Encounter (HOSPITAL_BASED_OUTPATIENT_CLINIC_OR_DEPARTMENT_OTHER): Payer: Self-pay | Admitting: *Deleted

## 2019-06-19 NOTE — Progress Notes (Signed)
      Enhanced Recovery after Surgery for Orthopedics Enhanced Recovery after Surgery is a protocol used to improve the stress on your body and your recovery after surgery.  Patient Instructions Drink this ensure at [facility], otherwise NPO after midnight. Hibiclens soap and instructions also given and reviewed with pt.   . The night before surgery:  o No food after midnight. ONLY clear liquids after midnight  . The day of surgery (if you do NOT have diabetes):  o Drink ONE (1) Pre-Surgery Clear Ensure as directed.   o This drink was given to you during your hospital  pre-op appointment visit. o The pre-op nurse will instruct you on the time to drink the  Pre-Surgery Ensure depending on your surgery time. o Finish the drink at the designated time by the pre-op nurse.  o Nothing else to drink after completing the  Pre-Surgery Clear Ensure.  . The day of surgery (if you have diabetes): o Drink ONE (1) Gatorade 2 (G2) as directed. o This drink was given to you during your hospital  pre-op appointment visit.  o The pre-op nurse will instruct you on the time to drink the   Gatorade 2 (G2) depending on your surgery time. o Color of the Gatorade may vary. Red is not allowed. o Nothing else to drink after completing the  Gatorade 2 (G2).         If you have questions, please contact your surgeon's office.

## 2019-06-20 ENCOUNTER — Other Ambulatory Visit (HOSPITAL_COMMUNITY)
Admission: RE | Admit: 2019-06-20 | Discharge: 2019-06-20 | Disposition: A | Discharge status: Home / Self Care | Payer: 59 | Source: Ambulatory Visit | Attending: Orthopedic Surgery | Admitting: Orthopedic Surgery

## 2019-06-20 DIAGNOSIS — Z20828 Contact with and (suspected) exposure to other viral communicable diseases: Secondary | ICD-10-CM | POA: Insufficient documentation

## 2019-06-20 DIAGNOSIS — Z01812 Encounter for preprocedural laboratory examination: Secondary | ICD-10-CM | POA: Diagnosis not present

## 2019-06-21 LAB — NOVEL CORONAVIRUS, NAA (HOSP ORDER, SEND-OUT TO REF LAB; TAT 18-24 HRS): SARS-CoV-2, NAA: NOT DETECTED

## 2019-06-24 ENCOUNTER — Ambulatory Visit (HOSPITAL_BASED_OUTPATIENT_CLINIC_OR_DEPARTMENT_OTHER): Payer: PRIVATE HEALTH INSURANCE | Admitting: Anesthesiology

## 2019-06-24 ENCOUNTER — Other Ambulatory Visit: Payer: Self-pay

## 2019-06-24 ENCOUNTER — Encounter (HOSPITAL_BASED_OUTPATIENT_CLINIC_OR_DEPARTMENT_OTHER): Payer: Self-pay | Admitting: Orthopedic Surgery

## 2019-06-24 ENCOUNTER — Ambulatory Visit (HOSPITAL_BASED_OUTPATIENT_CLINIC_OR_DEPARTMENT_OTHER)
Admission: RE | Admit: 2019-06-24 | Discharge: 2019-06-24 | Disposition: A | Discharge status: Home / Self Care | Payer: PRIVATE HEALTH INSURANCE | Attending: Orthopedic Surgery | Admitting: Orthopedic Surgery

## 2019-06-24 ENCOUNTER — Encounter (HOSPITAL_BASED_OUTPATIENT_CLINIC_OR_DEPARTMENT_OTHER)
Admission: RE | Disposition: A | Discharge status: Home / Self Care | Payer: Self-pay | Source: Home / Self Care | Attending: Orthopedic Surgery

## 2019-06-24 DIAGNOSIS — G4730 Sleep apnea, unspecified: Secondary | ICD-10-CM | POA: Diagnosis not present

## 2019-06-24 DIAGNOSIS — Y990 Civilian activity done for income or pay: Secondary | ICD-10-CM | POA: Diagnosis not present

## 2019-06-24 DIAGNOSIS — Z885 Allergy status to narcotic agent status: Secondary | ICD-10-CM | POA: Insufficient documentation

## 2019-06-24 DIAGNOSIS — F419 Anxiety disorder, unspecified: Secondary | ICD-10-CM | POA: Insufficient documentation

## 2019-06-24 DIAGNOSIS — K219 Gastro-esophageal reflux disease without esophagitis: Secondary | ICD-10-CM | POA: Diagnosis not present

## 2019-06-24 DIAGNOSIS — Z79899 Other long term (current) drug therapy: Secondary | ICD-10-CM | POA: Diagnosis not present

## 2019-06-24 DIAGNOSIS — M12811 Other specific arthropathies, not elsewhere classified, right shoulder: Secondary | ICD-10-CM | POA: Diagnosis not present

## 2019-06-24 DIAGNOSIS — I341 Nonrheumatic mitral (valve) prolapse: Secondary | ICD-10-CM | POA: Diagnosis not present

## 2019-06-24 DIAGNOSIS — Z888 Allergy status to other drugs, medicaments and biological substances status: Secondary | ICD-10-CM | POA: Insufficient documentation

## 2019-06-24 DIAGNOSIS — Z833 Family history of diabetes mellitus: Secondary | ICD-10-CM | POA: Insufficient documentation

## 2019-06-24 DIAGNOSIS — M75111 Incomplete rotator cuff tear or rupture of right shoulder, not specified as traumatic: Secondary | ICD-10-CM | POA: Diagnosis not present

## 2019-06-24 DIAGNOSIS — Z8249 Family history of ischemic heart disease and other diseases of the circulatory system: Secondary | ICD-10-CM | POA: Diagnosis not present

## 2019-06-24 DIAGNOSIS — M7541 Impingement syndrome of right shoulder: Secondary | ICD-10-CM | POA: Insufficient documentation

## 2019-06-24 DIAGNOSIS — F988 Other specified behavioral and emotional disorders with onset usually occurring in childhood and adolescence: Secondary | ICD-10-CM | POA: Diagnosis not present

## 2019-06-24 DIAGNOSIS — Z87442 Personal history of urinary calculi: Secondary | ICD-10-CM | POA: Insufficient documentation

## 2019-06-24 DIAGNOSIS — Z825 Family history of asthma and other chronic lower respiratory diseases: Secondary | ICD-10-CM | POA: Diagnosis not present

## 2019-06-24 HISTORY — DX: Sleep apnea, unspecified: G47.30

## 2019-06-24 HISTORY — DX: Other specified postprocedural states: R11.2

## 2019-06-24 HISTORY — DX: Other complications of anesthesia, initial encounter: T88.59XA

## 2019-06-24 HISTORY — DX: Nausea with vomiting, unspecified: Z98.890

## 2019-06-24 LAB — POCT PREGNANCY, URINE: Preg Test, Ur: NEGATIVE

## 2019-06-24 SURGERY — SHOULDER ARTHROSCOPY WITH SUBACROMIAL DECOMPRESSION AND DISTAL CLAVICLE EXCISION
Anesthesia: General | Site: Shoulder | Laterality: Right

## 2019-06-24 MED ORDER — SUGAMMADEX SODIUM 500 MG/5ML IV SOLN
INTRAVENOUS | Status: DC | PRN
  Administered 2019-06-24: 200 mg via INTRAVENOUS

## 2019-06-24 MED ORDER — SCOPOLAMINE 1 MG/3DAYS TD PT72
MEDICATED_PATCH | TRANSDERMAL | Status: AC
  Filled 2019-06-24: qty 1

## 2019-06-24 MED ORDER — SODIUM CHLORIDE 0.9 % IR SOLN
Status: DC | PRN
  Administered 2019-06-24: 6000 mL

## 2019-06-24 MED ORDER — BUPIVACAINE LIPOSOME 1.3 % IJ SUSP
INTRAMUSCULAR | Status: DC | PRN
  Administered 2019-06-24: 10 mL via PERINEURAL

## 2019-06-24 MED ORDER — EPHEDRINE SULFATE 50 MG/ML IJ SOLN
INTRAMUSCULAR | Status: DC | PRN
  Administered 2019-06-24 (×3): 10 mg via INTRAVENOUS

## 2019-06-24 MED ORDER — CEFAZOLIN SODIUM-DEXTROSE 2-4 GM/100ML-% IV SOLN
INTRAVENOUS | Status: AC
  Filled 2019-06-24: qty 100

## 2019-06-24 MED ORDER — FENTANYL CITRATE (PF) 100 MCG/2ML IJ SOLN
INTRAMUSCULAR | Status: AC
  Filled 2019-06-24: qty 2

## 2019-06-24 MED ORDER — ONDANSETRON HCL 4 MG/2ML IJ SOLN
INTRAMUSCULAR | Status: AC
  Filled 2019-06-24: qty 2

## 2019-06-24 MED ORDER — PROPOFOL 10 MG/ML IV BOLUS
INTRAVENOUS | Status: AC
  Filled 2019-06-24: qty 20

## 2019-06-24 MED ORDER — CELECOXIB 200 MG PO CAPS
200.0000 mg | ORAL_CAPSULE | Freq: Once | ORAL | Status: AC
  Administered 2019-06-24: 200 mg via ORAL

## 2019-06-24 MED ORDER — SUGAMMADEX SODIUM 500 MG/5ML IV SOLN
INTRAVENOUS | Status: AC
  Filled 2019-06-24: qty 5

## 2019-06-24 MED ORDER — ROCURONIUM BROMIDE 100 MG/10ML IV SOLN
INTRAVENOUS | Status: DC | PRN
  Administered 2019-06-24: 50 mg via INTRAVENOUS

## 2019-06-24 MED ORDER — BUPIVACAINE HCL (PF) 0.5 % IJ SOLN
INTRAMUSCULAR | Status: AC
  Filled 2019-06-24: qty 30

## 2019-06-24 MED ORDER — ACETAMINOPHEN 500 MG PO TABS
1000.0000 mg | ORAL_TABLET | Freq: Once | ORAL | Status: AC
  Administered 2019-06-24: 1000 mg via ORAL

## 2019-06-24 MED ORDER — FENTANYL CITRATE (PF) 100 MCG/2ML IJ SOLN
50.0000 ug | INTRAMUSCULAR | Status: DC | PRN
  Administered 2019-06-24: 50 ug via INTRAVENOUS

## 2019-06-24 MED ORDER — ACETAMINOPHEN 500 MG PO TABS
ORAL_TABLET | ORAL | Status: AC
  Filled 2019-06-24: qty 2

## 2019-06-24 MED ORDER — MIDAZOLAM HCL 2 MG/2ML IJ SOLN
1.0000 mg | INTRAMUSCULAR | Status: DC | PRN
  Administered 2019-06-24: 2 mg via INTRAVENOUS

## 2019-06-24 MED ORDER — PROPOFOL 10 MG/ML IV BOLUS
INTRAVENOUS | Status: DC | PRN
  Administered 2019-06-24: 150 mg via INTRAVENOUS

## 2019-06-24 MED ORDER — PHENYLEPHRINE 40 MCG/ML (10ML) SYRINGE FOR IV PUSH (FOR BLOOD PRESSURE SUPPORT)
PREFILLED_SYRINGE | INTRAVENOUS | Status: AC
  Filled 2019-06-24: qty 10

## 2019-06-24 MED ORDER — LIDOCAINE 2% (20 MG/ML) 5 ML SYRINGE
INTRAMUSCULAR | Status: AC
  Filled 2019-06-24: qty 5

## 2019-06-24 MED ORDER — SCOPOLAMINE 1 MG/3DAYS TD PT72
1.0000 | MEDICATED_PATCH | Freq: Once | TRANSDERMAL | Status: DC
  Administered 2019-06-24: 1.5 mg via TRANSDERMAL

## 2019-06-24 MED ORDER — ROCURONIUM BROMIDE 10 MG/ML (PF) SYRINGE
PREFILLED_SYRINGE | INTRAVENOUS | Status: AC
  Filled 2019-06-24: qty 20

## 2019-06-24 MED ORDER — DEXAMETHASONE SODIUM PHOSPHATE 10 MG/ML IJ SOLN
INTRAMUSCULAR | Status: AC
  Filled 2019-06-24: qty 1

## 2019-06-24 MED ORDER — CEFAZOLIN SODIUM-DEXTROSE 2-4 GM/100ML-% IV SOLN
2.0000 g | INTRAVENOUS | Status: AC
  Administered 2019-06-24: 2 g via INTRAVENOUS

## 2019-06-24 MED ORDER — ONDANSETRON HCL 4 MG/2ML IJ SOLN
INTRAMUSCULAR | Status: DC | PRN
  Administered 2019-06-24: 4 mg via INTRAVENOUS

## 2019-06-24 MED ORDER — FENTANYL CITRATE (PF) 100 MCG/2ML IJ SOLN
INTRAMUSCULAR | Status: DC | PRN
  Administered 2019-06-24: 50 ug via INTRAVENOUS

## 2019-06-24 MED ORDER — EPHEDRINE 5 MG/ML INJ
INTRAVENOUS | Status: AC
  Filled 2019-06-24: qty 10

## 2019-06-24 MED ORDER — BUPIVACAINE HCL (PF) 0.5 % IJ SOLN
INTRAMUSCULAR | Status: DC | PRN
  Administered 2019-06-24: 10 mL via PERINEURAL

## 2019-06-24 MED ORDER — DEXAMETHASONE SODIUM PHOSPHATE 4 MG/ML IJ SOLN
INTRAMUSCULAR | Status: DC | PRN
  Administered 2019-06-24: 10 mg via INTRAVENOUS

## 2019-06-24 MED ORDER — CELECOXIB 200 MG PO CAPS
ORAL_CAPSULE | ORAL | Status: AC
  Filled 2019-06-24: qty 1

## 2019-06-24 MED ORDER — MIDAZOLAM HCL 2 MG/2ML IJ SOLN
INTRAMUSCULAR | Status: AC
  Filled 2019-06-24: qty 2

## 2019-06-24 MED ORDER — CHLORHEXIDINE GLUCONATE 4 % EX LIQD: 60.0000 mL | Freq: Once | CUTANEOUS | Status: DC

## 2019-06-24 MED ORDER — LACTATED RINGERS IV SOLN
INTRAVENOUS | Status: DC
  Administered 2019-06-24: 13:00:00 via INTRAVENOUS

## 2019-06-24 MED ORDER — PHENYLEPHRINE HCL (PRESSORS) 10 MG/ML IV SOLN
INTRAVENOUS | Status: DC | PRN
  Administered 2019-06-24 (×3): 80 ug via INTRAVENOUS
  Administered 2019-06-24: 40 ug via INTRAVENOUS

## 2019-06-24 MED ORDER — LIDOCAINE HCL (CARDIAC) PF 100 MG/5ML IV SOSY
PREFILLED_SYRINGE | INTRAVENOUS | Status: DC | PRN
  Administered 2019-06-24: 75 mg via INTRAVENOUS

## 2019-06-24 MED FILL — tiZANidine HCL 2 MG TABS: 2 | 6 days supply | Qty: 20 | Fill #0

## 2019-06-24 MED FILL — OXYCODONE-ACETAMINOPHEN 5-3: 5-325 | 5 days supply | Qty: 30 | Fill #0

## 2019-06-24 SURGICAL SUPPLY — 85 items
BENZOIN TINCTURE PRP APPL 2/3 (GAUZE/BANDAGES/DRESSINGS) IMPLANT
BLADE CLIPPER SURG (BLADE) IMPLANT
BLADE SHAVER BONE 5.0MM X 13CM (MISCELLANEOUS) ×1
BLADE SHAVER BONE 5.0X13 (MISCELLANEOUS) ×2 IMPLANT
BLADE SURG 15 STRL LF DISP TIS (BLADE) IMPLANT
BLADE SURG 15 STRL SS (BLADE)
BURR OVAL 8 FLU 4.0MM X 13CM (MISCELLANEOUS) ×1
BURR OVAL 8 FLU 4.0X13 (MISCELLANEOUS) ×2 IMPLANT
CANNULA 5.75X7 CRYSTAL CLEAR (CANNULA) ×3 IMPLANT
CANNULA TWIST IN 8.25X7CM (CANNULA) IMPLANT
CHLORAPREP W/TINT 26 (MISCELLANEOUS) ×3 IMPLANT
CLOSURE WOUND 1/2 X4 (GAUZE/BANDAGES/DRESSINGS)
COVER WAND RF STERILE (DRAPES) IMPLANT
CUTTER BONE 4.0MM X 13CM (MISCELLANEOUS) ×3 IMPLANT
DECANTER SPIKE VIAL GLASS SM (MISCELLANEOUS) IMPLANT
DERMABOND ADVANCED (GAUZE/BANDAGES/DRESSINGS)
DERMABOND ADVANCED .7 DNX12 (GAUZE/BANDAGES/DRESSINGS) IMPLANT
DRAPE HALF SHEET 70X43 (DRAPES) IMPLANT
DRAPE IMP U-DRAPE 54X76 (DRAPES) ×3 IMPLANT
DRAPE INCISE IOBAN 66X45 STRL (DRAPES) ×3 IMPLANT
DRAPE STERI 35X30 U-POUCH (DRAPES) ×3 IMPLANT
DRAPE SURG 17X23 STRL (DRAPES) ×3 IMPLANT
DRAPE U-SHAPE 47X51 STRL (DRAPES) ×3 IMPLANT
DRAPE U-SHAPE 76X120 STRL (DRAPES) ×6 IMPLANT
DRSG PAD ABDOMINAL 8X10 ST (GAUZE/BANDAGES/DRESSINGS) ×3 IMPLANT
ELECT REM PT RETURN 9FT ADLT (ELECTROSURGICAL) ×3
ELECTRODE REM PT RTRN 9FT ADLT (ELECTROSURGICAL) ×1 IMPLANT
GAUZE 4X4 16PLY RFD (DISPOSABLE) IMPLANT
GAUZE SPONGE 4X4 12PLY STRL (GAUZE/BANDAGES/DRESSINGS) ×3 IMPLANT
GAUZE XEROFORM 1X8 LF (GAUZE/BANDAGES/DRESSINGS) ×3 IMPLANT
GLOVE BIO SURGEON STRL SZ7 (GLOVE) ×3 IMPLANT
GLOVE BIO SURGEON STRL SZ7.5 (GLOVE) ×3 IMPLANT
GLOVE BIOGEL M STRL SZ7.5 (GLOVE) ×3 IMPLANT
GLOVE BIOGEL PI IND STRL 7.0 (GLOVE) ×1 IMPLANT
GLOVE BIOGEL PI IND STRL 7.5 (GLOVE) IMPLANT
GLOVE BIOGEL PI IND STRL 8 (GLOVE) ×2 IMPLANT
GLOVE BIOGEL PI INDICATOR 7.0 (GLOVE) ×2
GLOVE BIOGEL PI INDICATOR 7.5 (GLOVE)
GLOVE BIOGEL PI INDICATOR 8 (GLOVE) ×4
GOWN STRL REUS W/ TWL LRG LVL3 (GOWN DISPOSABLE) ×2 IMPLANT
GOWN STRL REUS W/TWL LRG LVL3 (GOWN DISPOSABLE) ×4
LASSO CRESCENT QUICKPASS (SUTURE) IMPLANT
MANIFOLD NEPTUNE II (INSTRUMENTS) ×3 IMPLANT
NDL SUT 6 .5 CRC .975X.05 MAYO (NEEDLE) IMPLANT
NEEDLE 1/2 CIR CATGUT .05X1.09 (NEEDLE) IMPLANT
NEEDLE MAYO TAPER (NEEDLE)
NEEDLE SCORPION MULTI FIRE (NEEDLE) IMPLANT
NS IRRIG 1000ML POUR BTL (IV SOLUTION) IMPLANT
PACK ARTHROSCOPY DSU (CUSTOM PROCEDURE TRAY) ×3 IMPLANT
PACK BASIN DAY SURGERY FS (CUSTOM PROCEDURE TRAY) ×3 IMPLANT
PENCIL SMOKE EVACUATOR (MISCELLANEOUS) IMPLANT
PROBE BIPOLAR ATHRO 135MM 90D (MISCELLANEOUS) ×3 IMPLANT
RESTRAINT HEAD UNIVERSAL NS (MISCELLANEOUS) ×3 IMPLANT
SLEEVE SCD COMPRESS KNEE MED (MISCELLANEOUS) ×3 IMPLANT
SLING ARM FOAM STRAP LRG (SOFTGOODS) ×3 IMPLANT
SPONGE LAP 4X18 RFD (DISPOSABLE) IMPLANT
STRIP CLOSURE SKIN 1/2X4 (GAUZE/BANDAGES/DRESSINGS) IMPLANT
SUCTION FRAZIER HANDLE 10FR (MISCELLANEOUS)
SUCTION TUBE FRAZIER 10FR DISP (MISCELLANEOUS) IMPLANT
SUPPORT WRAP ARM LG (MISCELLANEOUS) IMPLANT
SUT BONE WAX W31G (SUTURE) IMPLANT
SUT ETHIBOND 2 OS 4 DA (SUTURE) IMPLANT
SUT ETHILON 3 0 PS 1 (SUTURE) IMPLANT
SUT ETHILON 4 0 PS 2 18 (SUTURE) ×3 IMPLANT
SUT FIBERWIRE #2 38 T-5 BLUE (SUTURE)
SUT MNCRL AB 3-0 PS2 18 (SUTURE) IMPLANT
SUT MNCRL AB 4-0 PS2 18 (SUTURE) IMPLANT
SUT PDS AB 0 CT 36 (SUTURE) ×3 IMPLANT
SUT PROLENE 3 0 PS 2 (SUTURE) IMPLANT
SUT TIGER TAPE 7 IN WHITE (SUTURE) IMPLANT
SUT VIC AB 0 CT1 27 (SUTURE)
SUT VIC AB 0 CT1 27XBRD ANBCTR (SUTURE) IMPLANT
SUT VIC AB 2-0 SH 27 (SUTURE)
SUT VIC AB 2-0 SH 27XBRD (SUTURE) IMPLANT
SUTURE FIBERWR #2 38 T-5 BLUE (SUTURE) IMPLANT
SUTURE TAPE 1.3 40 TPR END (SUTURE) IMPLANT
SUTURETAPE 1.3 40 TPR END (SUTURE)
SYR BULB 3OZ (MISCELLANEOUS) IMPLANT
TAPE FIBER 2MM 7IN #2 BLUE (SUTURE) IMPLANT
TOWEL GREEN STERILE FF (TOWEL DISPOSABLE) ×3 IMPLANT
TUBE CONNECTING 20'X1/4 (TUBING) ×1
TUBE CONNECTING 20X1/4 (TUBING) ×2 IMPLANT
TUBING ARTHROSCOPY IRRIG 16FT (MISCELLANEOUS) ×3 IMPLANT
WATER STERILE IRR 1000ML POUR (IV SOLUTION) ×3 IMPLANT
YANKAUER SUCT BULB TIP NO VENT (SUCTIONS) IMPLANT

## 2019-06-24 NOTE — Progress Notes (Signed)
Assisted Dr. Turk with right, ultrasound guided, interscalene  block. Side rails up, monitors on throughout procedure. See vital signs in flow sheet. Tolerated Procedure well. 

## 2019-06-24 NOTE — Transfer of Care (Signed)
Immediate Anesthesia Transfer of Care Note  Patient: Shannon Swanson  Procedure(s) Performed: RIGHT ARTHROSCOPY SHOULDER DEBRIDEMENT  ROTATOR CUFF REPAIR, WITH SUBACROMIAL DECOMPRESSION DISTAL CLAVICAL EXCISION (Right Shoulder)  Patient Location: PACU  Anesthesia Type:GA combined with regional for post-op pain  Level of Consciousness: drowsy and patient cooperative  Airway & Oxygen Therapy: Patient Spontanous Breathing and Patient connected to face mask oxygen  Post-op Assessment: Report given to RN and Post -op Vital signs reviewed and stable  Post vital signs: Reviewed and stable  Last Vitals:  Vitals Value Taken Time  BP 154/95 06/24/19 1452  Temp    Pulse 89 06/24/19 1456  Resp 15 06/24/19 1456  SpO2 98 % 06/24/19 1456  Vitals shown include unvalidated device data.  Last Pain:  Vitals:   06/24/19 1230  TempSrc: Tympanic  PainSc: 3       Patients Stated Pain Goal: 3 ([id number])  Complications: No apparent anesthesia complications

## 2019-06-24 NOTE — Discharge Instructions (Signed)
Discharge Instructions after Arthroscopic Shoulder Surgery   A sling has been provided for you. You may remove the sling after 72 hours. The sling may be worn for your protection, if you are in a crowd.  Use ice on the shoulder intermittently over the first 48 hours after surgery.  Pain medication has been prescribed for you.  Use your medication liberally over the first 48 hours, and then begin to taper your use. You may take Extra Strength Tylenol or Tylenol only in place of the pain pills. DO NOT take ANY nonsteroidal anti-inflammatory pain medications: Advil, Motrin, Ibuprofen, Aleve, Naproxen, or Naprosyn.  You may remove your dressing after two days.  You may shower 5 days after surgery. The incision CANNOT get wet prior to 5 days. Simply allow the water to wash over the site and then pat dry. Do not rub the incision. Make sure your axilla (armpit) is completely dry after showering.  Take one aspirin a day for 2 weeks after surgery, unless you have an aspirin sensitivity/allergy or asthma.  Three to 5 times each day you should perform assisted overhead reaching and external rotation (outward turning) exercises with the operative arm. Both exercises should be done with the non-operative arm used as the "therapist arm" while the operative arm remains relaxed. Ten of each exercise should be done three to five times each day.    Overhead reach is helping to lift your stiff arm up as high as it will go. To stretch your overhead reach, lie flat on your back, relax, and grasp the wrist of the tight shoulder with your opposite hand. Using the power in your opposite arm, bring the stiff arm up as far as it is comfortable. Start holding it for ten seconds and then work up to where you can hold it for a count of 30. Breathe slowly and deeply while the arm is moved. Repeat this stretch ten times, trying to help the arm up a little higher each time.       External rotation is turning the arm out  to the side while your elbow stays close to your body. External rotation is best stretched while you are lying on your back. Hold a cane, yardstick, broom handle, or dowel in both hands. Bend both elbows to a right angle. Use steady, gentle force from your normal arm to rotate the hand of the stiff shoulder out away from your body. Continue the rotation as far as it will go comfortably, holding it there for a count of 10. Repeat this exercise ten times.     Please call [phone] during normal business hours or [phone] after hours for any problems. Including the following:  - excessive redness of the incisions - drainage for more than 4 days - fever of more than 101.5 F  *Please note that pain medications will not be refilled after hours or on weekends.     1,000mg  Tylenol given at 12:30pm, No Tylenol until 7:30pm if needed   Post Anesthesia Home Care Instructions  Activity: Get plenty of rest for the remainder of the day. A responsible individual must stay with you for 24 hours following the procedure.  For the next 24 hours, DO NOT: -Drive a car -Paediatric nurse -Drink alcoholic beverages -Take any medication unless instructed by your physician -Make any legal decisions or sign important papers.  Meals: Start with liquid foods such as gelatin or soup. Progress to regular foods as tolerated. Avoid greasy, spicy,  heavy foods. If nausea and/or vomiting occur, drink only clear liquids until the nausea and/or vomiting subsides. Call your physician if vomiting continues.  Special Instructions/Symptoms: Your throat may feel dry or sore from the anesthesia or the breathing tube placed in your throat during surgery. If this causes discomfort, gargle with warm salt water. The discomfort should disappear within 24 hours.  If you had a scopolamine patch placed behind your ear for the management of post- operative nausea and/or vomiting:  1. The medication in the patch is effective  for 72 hours, after which it should be removed.  Wrap patch in a tissue and discard in the trash. Wash hands thoroughly with soap and water. 2. You may remove the patch earlier than 72 hours if you experience unpleasant side effects which may include dry mouth, dizziness or visual disturbances. 3. Avoid touching the patch. Wash your hands with soap and water after contact with the patch.    Information for Discharge Teaching: EXPAREL (bupivacaine liposome injectable suspension)   Your surgeon or anesthesiologist gave you EXPAREL(bupivacaine) to help control your pain after surgery.   EXPAREL is a local anesthetic that provides pain relief by numbing the tissue around the surgical site.  EXPAREL is designed to release pain medication over time and can control pain for up to 72 hours.  Depending on how you respond to EXPAREL, you may require less pain medication during your recovery.  Possible side effects:  Temporary loss of sensation or ability to move in the area where bupivacaine was injected.  Nausea, vomiting, constipation  Rarely, numbness and tingling in your mouth or lips, lightheadedness, or anxiety may occur.  Call your doctor right away if you think you may be experiencing any of these sensations, or if you have other questions regarding possible side effects.  Follow all other discharge instructions given to you by your surgeon or nurse. Eat a healthy diet and drink plenty of water or other fluids.  If you return to the hospital for any reason within 96 hours following the administration of EXPAREL, it is important for health care providers to know that you have received this anesthetic. A teal colored band has been placed on your arm with the date, time and amount of EXPAREL you have received in order to alert and inform your health care providers. Please leave this armband in place for the full 96 hours following administration, and then you may remove the band.  Regional  Anesthesia Blocks  1. Numbness or the inability to move the "blocked" extremity may last from 3-48 hours after placement. The length of time depends on the medication injected and your individual response to the medication. If the numbness is not going away after 48 hours, call your surgeon.  2. The extremity that is blocked will need to be protected until the numbness is gone and the  Strength has returned. Because you cannot feel it, you will need to take extra care to avoid injury. Because it may be weak, you may have difficulty moving it or using it. You may not know what position it is in without looking at it while the block is in effect.  3. For blocks in the legs and feet, returning to weight bearing and walking needs to be done carefully. You will need to wait until the numbness is entirely gone and the strength has returned. You should be able to move your leg and foot normally before you try and bear weight or walk. You will  need someone to be with you when you first try to ensure you do not fall and possibly risk injury.  4. Bruising and tenderness at the needle site are common side effects and will resolve in a few days.  5. Persistent numbness or new problems with movement should be communicated to the surgeon or the [place] [phone] [place] [phone]).      Discharge Instructions after Arthroscopic Shoulder Surgery   A sling has been provided for you. You may remove the sling after 72 hours. The sling may be worn for your protection, if you are in a crowd.  Use ice on the shoulder intermittently over the first 48 hours after surgery.  Pain medication has been prescribed for you.  Use your medication liberally over the first 48 hours, and then begin to taper your use. You may take Extra Strength Tylenol or Tylenol only in place of the pain pills. DO NOT take ANY nonsteroidal anti-inflammatory pain medications: Advil, Motrin, Ibuprofen,  Aleve, Naproxen, or Naprosyn.  You may remove your dressing after two days.  You may shower 5 days after surgery. The incision CANNOT get wet prior to 5 days. Simply allow the water to wash over the site and then pat dry. Do not rub the incision. Make sure your axilla (armpit) is completely dry after showering.  Take one aspirin a day for 2 weeks after surgery, unless you have an aspirin sensitivity/allergy or asthma.  Three to 5 times each day you should perform assisted overhead reaching and external rotation (outward turning) exercises with the operative arm. Both exercises should be done with the non-operative arm used as the "therapist arm" while the operative arm remains relaxed. Ten of each exercise should be done three to five times each day.    Overhead reach is helping to lift your stiff arm up as high as it will go. To stretch your overhead reach, lie flat on your back, relax, and grasp the wrist of the tight shoulder with your opposite hand. Using the power in your opposite arm, bring the stiff arm up as far as it is comfortable. Start holding it for ten seconds and then work up to where you can hold it for a count of 30. Breathe slowly and deeply while the arm is moved. Repeat this stretch ten times, trying to help the arm up a little higher each time.       External rotation is turning the arm out to the side while your elbow stays close to your body. External rotation is best stretched while you are lying on your back. Hold a cane, yardstick, broom handle, or dowel in both hands. Bend both elbows to a right angle. Use steady, gentle force from your normal arm to rotate the hand of the stiff shoulder out away from your body. Continue the rotation as far as it will go comfortably, holding it there for a count of 10. Repeat this exercise ten times.     Please call [phone] during normal business hours or [phone] after hours for any problems. Including the following:  -  excessive redness of the incisions - drainage for more than 4 days - fever of more than 101.5 F  *Please note that pain medications will not be refilled after hours or on weekends.

## 2019-06-24 NOTE — Anesthesia Preprocedure Evaluation (Addendum)
Anesthesia Evaluation  Patient identified by MRN, date of birth, ID band Patient awake    Reviewed: Allergy & Precautions, NPO status , Patient's Chart, lab work & pertinent test results  History of Anesthesia Complications (+) PONV and history of anesthetic complications  Airway Mallampati: II  TM Distance: >3 FB Neck ROM: Full    Dental  (+) Teeth Intact, Dental Advisory Given   Pulmonary sleep apnea ,    Pulmonary exam normal breath sounds clear to auscultation       Cardiovascular Normal cardiovascular exam+ Valvular Problems/Murmurs MVP  Rhythm:Regular Rate:Normal     Neuro/Psych PSYCHIATRIC DISORDERS Anxiety negative neurological ROS     GI/Hepatic Neg liver ROS, GERD  ,  Endo/Other  negative endocrine ROS  Renal/GU negative Renal ROS     Musculoskeletal RIGHT SHOULDER ROTATOR CUFF PARTIAL TEAR, IMPINGEMENT, ACROMIOCLAVICULAR DEGENERATIVE JOINT DISEASE   Abdominal   Peds  (+) ATTENTION DEFICIT DISORDER WITHOUT HYPERACTIVITY Hematology negative hematology ROS (+)   Anesthesia Other Findings Day of surgery medications reviewed with the patient.  Reproductive/Obstetrics                            Anesthesia Physical Anesthesia Plan  ASA: II  Anesthesia Plan: General   Post-op Pain Management:  Regional for Post-op pain   Induction: Intravenous  PONV Risk Score and Plan: 4 or greater and Scopolamine patch - Pre-op, Midazolam, Dexamethasone, Ondansetron and Diphenhydramine  Airway Management Planned: Oral ETT  Additional Equipment:   Intra-op Plan:   Post-operative Plan: Extubation in OR  Informed Consent: I have reviewed the patients History and Physical, chart, labs and discussed the procedure including the risks, benefits and alternatives for the proposed anesthesia with the patient or authorized representative who has indicated his/her understanding and acceptance.      Dental advisory given  Plan Discussed with: CRNA  Anesthesia Plan Comments:        Anesthesia Quick Evaluation

## 2019-06-24 NOTE — Op Note (Signed)
Procedure(s): RIGHT ARTHROSCOPY SHOULDER DEBRIDEMENT  ROTATOR CUFF REPAIR, WITH SUBACROMIAL DECOMPRESSION DISTAL CLAVICAL EXCISION Procedure Note  Shannon Swanson female 48 y.o. 06/24/2019  Preoperative diagnosis: #1 right shoulder rotator cuff tendinopathy with possible partial-thickness tearing #2 right shoulder impingement #3 right shoulder AC joint arthropathy  Postoperative diagnosis: Same  Procedure(s) and Anesthesia Type:    * RIGHT ARTHROSCOPY SHOULDER DEBRIDEMENT  ROTATOR CUFF partial tear, WITH SUBACROMIAL DECOMPRESSION DISTAL CLAVICAL EXCISION - General  Surgeon(s) and Role:    Tania Ade, MD - Primary     Surgeon: Isabella Stalling   Assistants: None  Anesthesia: General endotracheal anesthesia with interscalene block given by the attending anesthesiologist     Procedure Detail  RIGHT ARTHROSCOPY SHOULDER DEBRIDEMENT  ROTATOR CUFF REPAIR, WITH SUBACROMIAL DECOMPRESSION DISTAL CLAVICAL EXCISION  Estimated Blood Loss: Min         Drains: none  Blood Given: none         Specimens: none        Complications:  * No complications entered in OR log *         Disposition: PACU - hemodynamically stable.         Condition: stable    Procedure:   INDICATIONS FOR SURGERY: The patient is 48 y.o. female who has had a long history of right shoulder pain since an injury that occurred at work.  She had extensive conservative management that has failed to alleviate symptoms.  She had an MRI which revealed extensive tendinosis of the supraspinatus and also had symptomatic AC joint disease.  Indicated for surgical treatment to try to decrease pain and restore function.  OPERATIVE FINDINGS: Examination under anesthesia: No stiffness or instability   DESCRIPTION OF PROCEDURE: The patient was identified in preoperative  holding area where I personally marked the operative site after  verifying site, side, and procedure with the patient. An interscalene block  was given by the attending anesthesiologist the holding area.  The patient was taken back to the operating room where general anesthesia was induced without complication and was placed in the beach-chair position with the back  elevated about 60 degrees and all extremities and head and neck carefully padded and  positioned.   The right upper extremity was then prepped and  draped in a standard sterile fashion. The appropriate time-out  procedure was carried out. The patient did receive IV antibiotics  within 30 minutes of incision.   A small posterior portal incision was made and the arthroscope was introduced into the joint. An anterior portal was then established above the subscapularis using needle localization. Small cannula was placed anteriorly. Diagnostic arthroscopy was then carried out.  The subscapularis was completely intact.  The biceps tendon looked good with no partial tearing or significant tendinopathy.  Glenohumeral joint surfaces were without any significant chondromalacia.  Circumferentially the labrum was intact.  There was minimal synovitis in the joint.  The undersurface of the rotator cuff was carefully examined and she was noted on the posterior aspect of the supraspinatus to have partial articular sided tearing.  This was debrided back.  In 1 focal area the partial tearing involve probably about 40% of the footprint.  This area was tagged using a spinal needle and a PDS suture to look at from the subacromial space.  The arthroscope was then introduced into the subacromial space a standard lateral portal was established with needle localization. The shaver was used through the lateral portal to perform extensive bursectomy. Coracoacromial ligament was  examined and found to be severely frayed indicating chronic impingement.  There was some superficial partial tearing on the bursal side of the supraspinatus but once this was debrided back the tendon did look completely intact  and the area of the PDS suture was carefully examined and there was not felt to be any significant thinning here to warrant takedown and repair.  The coracoacromial ligament was taken down off the anterior acromion with the ArthroCare exposing a moderate hooked anterior acromial spur. A high-speed bur was then used through the lateral portal to take down the anterior acromial spur from lateral to medial in a standard acromioplasty.  The acromioplasty was also viewed from the lateral portal and the bur was used as necessary to ensure that the acromion was completely flat from posterior to anterior.  The distal clavicle was exposed arthroscopically and the bur was used to take off the undersurface for approximately 8 mm from the lateral portal. The bur was then moved to an anterior portal position to complete the distal clavicle excision resecting about 8 mm of the distal clavicle and a smooth even fashion. This was viewed from anterior and lateral portals and felt to be complete.  The arthroscopic equipment was removed from the joint and the portals were closed with 3-0 nylon in an interrupted fashion. Sterile dressings were then applied including Xeroform 4 x 4's ABDs and tape. The patient was then allowed to awaken from general anesthesia, placed in a sling, transferred to the stretcher and taken to the recovery room in stable condition.   POSTOPERATIVE PLAN: The patient will be discharged home today and will followup in one week for suture removal and wound check.

## 2019-06-24 NOTE — Anesthesia Procedure Notes (Signed)
Procedure Name: Intubation Date/Time: 06/24/2019 1:52 PM Performed by: Marrianne Mood, CRNA Pre-anesthesia Checklist: Patient identified, Emergency Drugs available, Suction available, Patient being monitored and Timeout performed Patient Re-evaluated:Patient Re-evaluated prior to induction Oxygen Delivery Method: Circle system utilized Preoxygenation: Pre-oxygenation with 100% oxygen Induction Type: IV induction Ventilation: Mask ventilation without difficulty Laryngoscope Size: Glidescope, Mac and 3 Grade View: Grade III Tube type: Oral Number of attempts: 1 Airway Equipment and Method: Stylet and Oral airway Placement Confirmation: ETT inserted through vocal cords under direct vision,  positive ETCO2 and breath sounds checked- equal and bilateral Secured at: 23 cm Tube secured with: Tape Dental Injury: Teeth and Oropharynx as per pre-operative assessment  Difficulty Due To: Difficulty was anticipated, Difficult Airway- due to reduced neck mobility, Difficult Airway- due to limited oral opening and Difficult Airway- due to dentition

## 2019-06-24 NOTE — H&P (Signed)
Shannon Swanson is an 48 y.o. female.   Chief Complaint: Right shoulder pain HPI: Right shoulder pain since an injury that occurred at work.  She has failed extensive conservative management.  MRI revealed severe rotator cuff tendinosis.  She also has associated AC joint pain.  Indicated for surgical treatment to try and decrease pain and restore function.  Past Medical History:  Diagnosis Date  . Anxiety   . Attention deficit disorder (ADD)   . Complication of anesthesia   . GERD (gastroesophageal reflux disease)   . Kidney stones   . MVP (mitral valve prolapse)   . PONV (postoperative nausea and vomiting)   . Sleep apnea     Past Surgical History:  Procedure Laterality Date  . CERVICAL DISCECTOMY  2007   anterior approach 2007 Dr Saintclair Halsted   . LUMBAR DISC SURGERY    . TONSILLECTOMY      Family History  Problem Relation Age of Onset  . Emphysema Father   . Diabetes Mother   . Mitral valve prolapse Mother   . Hiatal hernia Mother   . Diabetes Sister   . Diabetes Maternal Aunt   . Diabetes Maternal Uncle    Social History:  reports that she has never smoked. She has never used smokeless tobacco. She reports that she does not drink alcohol or use drugs.  Allergies:  Allergies  Allergen Reactions  . Codeine   . Meperidine Hcl     Medications Prior to Admission  Medication Sig Dispense Refill  . Cyanocobalamin 1000 MCG SUBL Place 1 tablet (1,000 mcg total) under the tongue daily. 100 tablet 11  . escitalopram (LEXAPRO) 10 MG tablet Take 1.5 tablets (15 mg total) by mouth at bedtime. (Patient taking differently: Take 15 mg by mouth at bedtime. ) 135 tablet 1  . gabapentin (NEURONTIN) 250 MG/5ML solution Take 300 mg by mouth at bedtime.    Marland Kitchen lactase (LACTAID) 3000 UNITS tablet Take 1 tablet by mouth 3 (three) times daily with meals.    . methylphenidate (RITALIN) 5 MG tablet Take 1 tablet (5 mg total) by mouth 2 (two) times daily. (Patient taking differently: Take 5 mg by  mouth 2 (two) times daily. ) 180 tablet 0  . methylphenidate (METADATE ER) 20 MG ER tablet Take 1 tablet (20 mg total) by mouth daily after breakfast. (Patient taking differently: Take 20 mg by mouth daily after breakfast. ) 90 tablet 0    Results for orders placed or performed during the hospital encounter of 06/24/19 (from the past 48 hour(s))  Pregnancy, urine POC     Status: None   Collection Time: 06/24/19 12:51 PM  Result Value Ref Range   Preg Test, Ur NEGATIVE NEGATIVE    Comment:        THE SENSITIVITY OF THIS METHODOLOGY IS >24 mIU/mL    No results found.  Review of Systems  All other systems reviewed and are negative.   Blood pressure 140/87, pulse 86, temperature (!) 97.5 F (36.4 C), temperature source Tympanic, resp. rate (!) 22, height 5\' 8"  (1.727 m), weight 74.4 kg, last menstrual period 06/16/2018, SpO2 100 %. Physical Exam  Constitutional: She is oriented to person, place, and time. She appears well-developed and well-nourished.  HENT:  Head: Atraumatic.  Eyes: EOM are normal.  Cardiovascular: Intact distal pulses.  Respiratory: Effort normal.  Musculoskeletal:     Comments: Right shoulder pain with impingement testing and AC joint tenderness  Neurological: She is alert and oriented to person,  place, and time.  Skin: Skin is warm and dry.  Psychiatric: She has a normal mood and affect.     Assessment/Plan Right shoulder pain since an injury that occurred at work.  She has failed extensive conservative management.  MRI revealed severe rotator cuff tendinosis.  She also has associated AC joint pain.  Indicated for surgical treatment to try and decrease pain and restore function. Plan right shoulder arthroscopic examination rotator cuff with repair versus debridement, SAD, DCE Risks / benefits of surgery discussed Consent on chart  NPO for OR Preop antibiotics  Isabella Stalling, MD 06/24/2019, 1:30 PM

## 2019-06-24 NOTE — Anesthesia Procedure Notes (Signed)
Anesthesia Regional Block: Interscalene brachial plexus block   Pre-Anesthetic Checklist: ,, timeout performed, Correct Patient, Correct Site, Correct Laterality, Correct Procedure, Correct Position, site marked, Risks and benefits discussed,  Surgical consent,  Pre-op evaluation,  At surgeon's request and post-op pain management  Laterality: Right  Prep: chloraprep       Needles:  Injection technique: Single-shot  Needle Type: Echogenic Stimulator Needle     Needle Length: 5cm  Needle Gauge: 22     Additional Needles:   Procedures:,,,, ultrasound used (permanent image in chart),,,,  Narrative:  Start time: 06/24/2019 12:57 PM End time: 06/24/2019 1:05 PM Injection made incrementally with aspirations every 5 mL.  Performed by: Personally  Anesthesiologist: Catalina Gravel, MD  Additional Notes: Functioning IV was confirmed and monitors were applied.  A 43mm 22ga Arrow echogenic stimulator needle was used. Sterile prep and drape, hand hygiene, and sterile gloves were used.  Negative aspiration and negative test dose prior to incremental administration of local anesthetic. The patient tolerated the procedure well.  Ultrasound guidance: relevent anatomy identified, needle position confirmed, local anesthetic spread visualized around nerve(s), vascular puncture avoided.  Image printed for medical record.

## 2019-06-24 NOTE — Anesthesia Postprocedure Evaluation (Signed)
Anesthesia Post Note  Patient: AMIYA TESSENDORF  Procedure(s) Performed: RIGHT ARTHROSCOPY SHOULDER DEBRIDEMENT  ROTATOR CUFF REPAIR, WITH SUBACROMIAL DECOMPRESSION DISTAL CLAVICAL EXCISION (Right Shoulder)     Patient location during evaluation: PACU Anesthesia Type: General Level of consciousness: awake and alert Pain management: pain level controlled Vital Signs Assessment: post-procedure vital signs reviewed and stable Respiratory status: spontaneous breathing, nonlabored ventilation and respiratory function stable Cardiovascular status: blood pressure returned to baseline and stable Postop Assessment: no apparent nausea or vomiting Anesthetic complications: no    Last Vitals:  Vitals:   06/24/19 1600 06/24/19 1614  BP: 127/81 137/83  Pulse: 84 68  Resp: 14 20  Temp:  36.6 C  SpO2: 96% 96%    Last Pain:  Vitals:   06/24/19 1614  TempSrc:   PainSc: 0-No pain                 Catalina Gravel

## 2019-07-11 MED FILL — GABAPENTIN 250 MG/5ML SOLN: 250 | 30 days supply | Qty: 180 | Fill #0

## 2019-07-23 ENCOUNTER — Ambulatory Visit (INDEPENDENT_AMBULATORY_CARE_PROVIDER_SITE_OTHER): Payer: No Typology Code available for payment source | Admitting: Psychiatry

## 2019-07-23 ENCOUNTER — Other Ambulatory Visit: Payer: Self-pay

## 2019-07-23 ENCOUNTER — Encounter: Payer: Self-pay | Admitting: Psychiatry

## 2019-07-23 VITALS — Ht 69.0 in | Wt 161.0 lb

## 2019-07-23 DIAGNOSIS — F411 Generalized anxiety disorder: Secondary | ICD-10-CM

## 2019-07-23 DIAGNOSIS — F422 Mixed obsessional thoughts and acts: Secondary | ICD-10-CM | POA: Diagnosis not present

## 2019-07-23 DIAGNOSIS — F900 Attention-deficit hyperactivity disorder, predominantly inattentive type: Secondary | ICD-10-CM

## 2019-07-23 MED ORDER — DIAZEPAM 2 MG PO TABS: 2.0000 mg | ORAL_TABLET | Freq: Two times a day (BID) | ORAL | 0 refills | Status: DC | PRN

## 2019-07-23 MED ORDER — METHYLPHENIDATE HCL 5 MG PO TABS: 5.0000 mg | ORAL_TABLET | Freq: Three times a day (TID) | ORAL | 0 refills | Status: DC

## 2019-07-23 MED ORDER — ESCITALOPRAM OXALATE 20 MG PO TABS: 20.0000 mg | ORAL_TABLET | Freq: Every day | ORAL | 0 refills | Status: DC

## 2019-07-23 MED FILL — diazePAM 2 MG TABS: 2 | 30 days supply | Qty: 60 | Fill #0

## 2019-07-23 MED FILL — METHYLPHENIDATE HCL 5 MG TA: 5 | 90 days supply | Qty: 270 | Fill #0

## 2019-07-23 MED FILL — ESCITALOPRAM 20 MG TABLET: 20 | 90 days supply | Qty: 90 | Fill #0
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
## 2019-07-29 MED FILL — tiZANidine HCL 2 MG TABS: 2 | 6 days supply | Qty: 20 | Fill #0

## 2019-07-29 MED FILL — IBUPROFEN 800 MG TAB: 800 | 10 days supply | Qty: 30 | Fill #0

## 2019-08-21 ENCOUNTER — Other Ambulatory Visit: Payer: Self-pay | Admitting: Neurosurgery

## 2019-08-21 ENCOUNTER — Telehealth: Payer: Self-pay

## 2019-08-21 DIAGNOSIS — M5412 Radiculopathy, cervical region: Secondary | ICD-10-CM

## 2019-08-21 NOTE — Telephone Encounter (Signed)
Spoke with patient to review her medications before she is scheduled for a cervical myelogram.  She was informed she will be here two hours, will need a driver and will need to be on strict bedrest for 24 hours after after the procedure.  She also was informed she will need to hold Lexapro and Ritalin for 48 hours before, and 24 hours after, the procedure.

## 2019-08-26 MED FILL — DICLOFENAC SODIUM 1 % GEL: 1 | 30 days supply | Qty: 100 | Fill #0

## 2019-08-26 MED FILL — tiZANidine HCL 2 MG TABS: 2 | 6 days supply | Qty: 20 | Fill #0

## 2019-08-26 MED FILL — GABAPENTIN 250 MG/5ML SOLN: 250 | 30 days supply | Qty: 180 | Fill #0

## 2019-08-29 ENCOUNTER — Ambulatory Visit: Payer: No Typology Code available for payment source | Admitting: Family Medicine

## 2019-09-04 ENCOUNTER — Ambulatory Visit
Admission: RE | Admit: 2019-09-04 | Discharge: 2019-09-04 | Disposition: A | Discharge status: Home / Self Care | Payer: Worker's Compensation | Source: Ambulatory Visit | Attending: Neurosurgery | Admitting: Neurosurgery

## 2019-09-04 ENCOUNTER — Other Ambulatory Visit: Payer: Self-pay

## 2019-09-04 ENCOUNTER — Ambulatory Visit
Admission: RE | Admit: 2019-09-04 | Discharge: 2019-09-04 | Disposition: A | Discharge status: Home / Self Care | Payer: Self-pay | Source: Ambulatory Visit | Attending: Neurosurgery | Admitting: Neurosurgery

## 2019-09-04 DIAGNOSIS — M5412 Radiculopathy, cervical region: Secondary | ICD-10-CM

## 2019-09-04 MED ORDER — DIPHENHYDRAMINE HCL 50 MG PO CAPS
50.0000 mg | ORAL_CAPSULE | Freq: Once | ORAL | Status: AC
  Administered 2019-09-04: 11:00:00 50 mg via ORAL

## 2019-09-04 MED ORDER — ONDANSETRON HCL 4 MG/2ML IJ SOLN
4.0000 mg | Freq: Once | INTRAMUSCULAR | Status: AC
  Administered 2019-09-04: 4 mg via INTRAMUSCULAR

## 2019-09-04 MED ORDER — HYDROMORPHONE HCL 1 MG/ML IJ SOLN
0.5000 mg | Freq: Once | INTRAMUSCULAR | Status: AC
  Administered 2019-09-04: 12:00:00 0.5 mg via INTRAMUSCULAR

## 2019-09-04 MED ORDER — DIAZEPAM 5 MG PO TABS: 10.0000 mg | ORAL_TABLET | Freq: Once | ORAL | Status: DC

## 2019-09-04 MED ORDER — IOPAMIDOL (ISOVUE-M 300) INJECTION 61%
10.0000 mL | Freq: Once | INTRAMUSCULAR | Status: AC | PRN
  Administered 2019-09-04: 12:00:00 10 mL via INTRATHECAL

## 2019-09-04 NOTE — Discharge Instr - Other Orders (Addendum)
1130: Pt c/o pain due to myelogram procedure. MD ordered medications to be given. See MAR.  1156: Pt reports relief in pain. Talking in full sentences. Dr. Nelia Shi stated to discharge patient at 1220.

## 2019-09-04 NOTE — Discharge Instructions (Signed)
Myelogram Discharge Instructions  1. Go home and rest quietly for the next 24 hours.  It is important to lie flat for the next 24 hours.  Get up only to go to the restroom.  You may lie in the bed or on a couch on your back, your stomach, your left side or your right side.  You may have one pillow under your head.  You may have pillows between your knees while you are on your side or under your knees while you are on your back.  2. DO NOT drive today.  Recline the seat as far back as it will go, while still wearing your seat belt, on the way home.  3. You may get up to go to the bathroom as needed.  You may sit up for 10 minutes to eat.  You may resume your normal diet and medications unless otherwise indicated.  Drink plenty of extra fluids today and tomorrow.  4. The incidence of a spinal headache with nausea and/or vomiting is about 5% (one in 20 patients).  If you develop a headache, lie flat and drink plenty of fluids until the headache goes away.  Caffeinated beverages may be helpful.  If you develop severe nausea and vomiting or a headache that does not go away with flat bed rest, call [phone].  5. You may resume normal activities after your 24 hours of bed rest is over; however, do not exert yourself strongly or do any heavy lifting tomorrow.  6. Call your physician for a follow-up appointment.    You may resume Lexapro and Ritalin on Thursday, September 05, 2019 after 10:30a.m.

## 2019-09-04 NOTE — Progress Notes (Signed)
Pt reports she has been off of her Ritalin and Lexapro for at least 48 hours.

## 2019-09-04 NOTE — Progress Notes (Signed)
Patient stated she had a LESI back in 1992 and immediately developed hives, treated with IV Benadryl.  She has, however, had a CT scan of her abd/pelvis in 2017 with oral and IV contrast and says she tolerated that "fine".  Dr. Nelia Shi was notified of all of this and is willing to proceed but wants to premedicate patient with Benadryl 50mg  PO.  Patient states she feels "very comfortable" with proceeding, too.

## 2019-09-25 MED FILL — GABAPENTIN 250 MG/5ML SOLN: 250 | 30 days supply | Qty: 180 | Fill #0

## 2019-10-02 MED FILL — tiZANidine HCL 2 MG TABS: 2 | 6 days supply | Qty: 20 | Fill #0

## 2019-10-08 MED FILL — GABAPENTIN 250 MG/5ML SOLN: 250 | 30 days supply | Qty: 180 | Fill #0

## 2019-10-08 MED FILL — tiZANidine HCL 2 MG TABS: 2 | 6 days supply | Qty: 20 | Fill #0

## 2019-10-21 ENCOUNTER — Ambulatory Visit: Payer: No Typology Code available for payment source | Admitting: Psychiatry

## 2019-10-23 ENCOUNTER — Other Ambulatory Visit: Payer: Self-pay

## 2019-10-23 ENCOUNTER — Encounter: Payer: Self-pay | Admitting: Psychiatry

## 2019-10-23 ENCOUNTER — Ambulatory Visit (INDEPENDENT_AMBULATORY_CARE_PROVIDER_SITE_OTHER): Payer: No Typology Code available for payment source | Admitting: Psychiatry

## 2019-10-23 VITALS — Ht 69.0 in | Wt 163.0 lb

## 2019-10-23 DIAGNOSIS — F422 Mixed obsessional thoughts and acts: Secondary | ICD-10-CM

## 2019-10-23 DIAGNOSIS — F900 Attention-deficit hyperactivity disorder, predominantly inattentive type: Secondary | ICD-10-CM

## 2019-10-23 DIAGNOSIS — F411 Generalized anxiety disorder: Secondary | ICD-10-CM

## 2019-10-23 MED ORDER — METHYLPHENIDATE HCL 5 MG PO TABS: 5.0000 mg | ORAL_TABLET | Freq: Three times a day (TID) | ORAL | 0 refills | Status: DC

## 2019-10-23 MED ORDER — ESCITALOPRAM OXALATE 20 MG PO TABS: 20.0000 mg | ORAL_TABLET | Freq: Every day | ORAL | 0 refills | Status: DC

## 2019-10-23 MED FILL — ESCITALOPRAM 20 MG TABLET: 20 | 90 days supply | Qty: 90 | Fill #0
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
## 2020-01-09 MED FILL — GABAPENTIN 250 MG/5ML SOLN: 250 | 30 days supply | Qty: 180 | Fill #0

## 2020-01-22 ENCOUNTER — Other Ambulatory Visit: Payer: Self-pay | Admitting: Psychiatry

## 2020-01-22 ENCOUNTER — Encounter: Payer: Self-pay | Admitting: Psychiatry

## 2020-01-22 ENCOUNTER — Ambulatory Visit (INDEPENDENT_AMBULATORY_CARE_PROVIDER_SITE_OTHER): Payer: No Typology Code available for payment source | Admitting: Psychiatry

## 2020-01-22 ENCOUNTER — Other Ambulatory Visit: Payer: Self-pay

## 2020-01-22 VITALS — Ht 69.0 in | Wt 169.0 lb

## 2020-01-22 DIAGNOSIS — F900 Attention-deficit hyperactivity disorder, predominantly inattentive type: Secondary | ICD-10-CM | POA: Diagnosis not present

## 2020-01-22 DIAGNOSIS — F422 Mixed obsessional thoughts and acts: Secondary | ICD-10-CM | POA: Diagnosis not present

## 2020-01-22 DIAGNOSIS — F411 Generalized anxiety disorder: Secondary | ICD-10-CM | POA: Diagnosis not present

## 2020-01-22 MED ORDER — ESCITALOPRAM OXALATE 20 MG PO TABS: 20.0000 mg | ORAL_TABLET | Freq: Every day | ORAL | 2 refills | Status: DC

## 2020-01-22 NOTE — Progress Notes (Signed)
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  She is still apprehensive about limited symptom panic should she be reexposed to triggers for her on-the-job injury inquiring whether she will be able to face such clinical situations again.  She is currently working more of a desk job which she feels is difficult for her neck and shoulder pain having had shoulder surgery and now receiving epidural injections for the neck pain which she states caused excessive drowsiness.  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 She worries most about family following the death of her husband from bone cancer.  After discussing the care here in general for her son and daughter, she for first time shares family photos and church responses for her loss.  She is using much less Ritalin not filling the 24-month supply from 10/23/2019 still being on computer at [facility].  She did advance the Lexapro to 20 mg nightly last January 2 months ago having some improvement and no side effects.  She will gradually allow mobilization of all issues but is hesitant to make any changes though daughter is now doing very well in therapy here for similar anxiety.  She has no mania, suicidality, psychosis or delirium.  Anxiety Presents forfollow-upvisit. Symptoms includeobsessive slowness cognitively,ritualized  rechecking for worrisome doubts,decreased concentration,excessive worry,nervous/anxious behavior,insomnia, limited symptoms panic reexperiencing,andavoidance. Patient reports nodepressed mood, perceptual distortions, or suicidal ideas. Symptoms occurmost days. The severity of symptoms ismoderate. The quality of sleep isfair. Nighttime awakenings:occasional. Compliance with medications is51-75%. Side effects of treatment includeGI discomfort.  Individual Medical History/ Review of Systems: Changes? :Yes Weight is up 6 pounds in 3 months continuing epidural injections for neck pain.  Allergies: Meperidine hcl and Codeine  Current Medications:  Current Outpatient Medications:    acetaminophen (TYLENOL) 500 MG tablet, Take 500 mg by mouth every 6 (six) hours as needed., Disp: , Rfl:    alum hydroxide-mag trisilicate (GAVISCON) 96-22 MG CHEW chewable tablet, Chew by mouth., Disp: , Rfl:    Cyanocobalamin 1000 MCG SUBL, Place 1 tablet (1,000 mcg total) under the tongue daily., Disp: 100 tablet, Rfl: 11   diazepam (VALIUM) 2 MG tablet, Take 1 tablet (2 mg total) by mouth 2 (two) times daily as needed for anxiety (Panic attack; flashback)., Disp: 60 tablet, Rfl: 0   escitalopram (LEXAPRO) 20 MG tablet, Take 1 tablet (20 mg total) by mouth at bedtime., Disp: 90 tablet, Rfl: 2   gabapentin (NEURONTIN) 250 MG/5ML solution, Take 300 mg by mouth at bedtime., Disp: , Rfl:    ibuprofen (ADVIL) 800 MG tablet, Take 800 mg by mouth every 8 (eight) hours as needed., Disp: , Rfl:    lactase (LACTAID) 3000 UNITS tablet, Take 1 tablet by mouth 3 (three) times daily with meals., Disp: , Rfl:    methylphenidate (RITALIN) 5 MG tablet, Take 1 tablet (5 mg total) by mouth 3 (three) times daily., Disp: 270 tablet, Rfl: 0   tiZANidine (ZANAFLEX) 2 MG tablet, ,  Disp: , Rfl:   Medication Side Effects: none  Family Medical/ Social History: Changes? No  MENTAL HEALTH EXAM:  Height 5\' 9"  (1.753 m),  weight 169 lb (76.7 kg).Body mass index is 24.96 kg/m. Postural reflexes and gait 0/0, and AIMS = 0.  General Appearance: Casual, Guarded, Meticulous and Well Groomed  Eye Contact:  Fair  Speech:  Clear and Coherent, Normal Rate and Talkative  Volume:  Normal  Mood:  Anxious, Dysphoric and Euthymic  Affect:  Congruent, Inappropriate, Restricted and Anxious  Thought Process:  Coherent, Goal Directed, Irrelevant, Linear and Descriptions of Associations: Circumstantial  Orientation:  Full (Time, Place, and Person)  Thought Content: Obsessions and Rumination   Suicidal Thoughts:  No  Homicidal Thoughts:  No  Memory:  Immediate;   Good Remote;   Good  Judgement:  Good  Insight:  Fair  Psychomotor Activity:  Normal and Mannerisms  Concentration:  Concentration: Fair and Attention Span: Fair  Recall:  [employer] of Knowledge: Good  Language: Good  Assets:  Desire for Improvement Resilience Social Support Talents/Skills  ADL's:  Intact  Cognition: WNL  Prognosis:  Good    DIAGNOSES:    ICD-10-CM   1. Mixed obsessional thoughts and acts  F42.2 escitalopram (LEXAPRO) 20 MG tablet  2. ADHD, predominantly inattentive type  F90.0   3. Generalized anxiety disorder  F41.1 escitalopram (LEXAPRO) 20 MG tablet    Receiving Psychotherapy: No    RECOMMENDATIONS: Psychosupportive psychoeducation integrates cognitive behavioral nutrition, sleep hygiene, social problem-solving, and frustration management with symptom treatment matching concluding to continue current medications not taking much Valium or Ritalin though needing particular the Ritalin. She is escribed to continue Lexapro 20 mg every bedtime sent as #90 with 2 refills to [facility] for generalized anxiety disorder.  She has not yet received dispensing of the Ritalin 5 mg IR tablet 3 times daily #270 on the [place] computer 10/23/2019 for ADHD.  She has filled 60 of the Valium 2 mg twice daily as needed for  posttraumatic and limited symptom panic anxiety but possibly using only 1 or 2 tablets if any.  Emphasizing the benefits of therapy for her daughter in this office and updating prevention and monitoring safety hygiene, she will return in 6 months or sooner if needed.  Delight Hoh, MD

## 2020-01-24 ENCOUNTER — Other Ambulatory Visit (HOSPITAL_COMMUNITY): Payer: Self-pay | Admitting: Orthopedic Surgery

## 2020-01-24 ENCOUNTER — Other Ambulatory Visit: Payer: Self-pay | Admitting: Orthopedic Surgery

## 2020-01-24 DIAGNOSIS — M25511 Pain in right shoulder: Secondary | ICD-10-CM

## 2020-01-28 MED FILL — METHYLPHENIDATE HCL 5 MG TA: 5 | 90 days supply | Qty: 270 | Fill #0

## 2020-01-30 ENCOUNTER — Ambulatory Visit (HOSPITAL_COMMUNITY): Payer: No Typology Code available for payment source

## 2020-02-10 ENCOUNTER — Other Ambulatory Visit: Payer: Self-pay

## 2020-02-10 ENCOUNTER — Ambulatory Visit (HOSPITAL_COMMUNITY)
Admission: RE | Admit: 2020-02-10 | Discharge: 2020-02-10 | Disposition: A | Discharge status: Home / Self Care | Payer: PRIVATE HEALTH INSURANCE | Source: Ambulatory Visit | Attending: Orthopedic Surgery | Admitting: Orthopedic Surgery

## 2020-02-10 DIAGNOSIS — M25511 Pain in right shoulder: Secondary | ICD-10-CM | POA: Diagnosis present

## 2020-02-12 ENCOUNTER — Ambulatory Visit (HOSPITAL_COMMUNITY): Payer: No Typology Code available for payment source

## 2020-04-30 ENCOUNTER — Encounter: Payer: Self-pay | Admitting: Psychiatry

## 2020-05-06 ENCOUNTER — Other Ambulatory Visit: Payer: Self-pay | Admitting: Psychiatry

## 2020-05-06 ENCOUNTER — Other Ambulatory Visit: Payer: Self-pay | Admitting: *Deleted

## 2020-05-06 DIAGNOSIS — F900 Attention-deficit hyperactivity disorder, predominantly inattentive type: Secondary | ICD-10-CM

## 2020-05-06 DIAGNOSIS — R002 Palpitations: Secondary | ICD-10-CM

## 2020-05-06 MED FILL — ESCITALOPRAM 20 MG TABLET: 20 | 90 days supply | Qty: 90 | Fill #0

## 2020-05-06 MED FILL — METHYLPHENIDATE HCL 5 MG TA: 5 | 90 days supply | Qty: 270 | Fill #0

## 2020-05-06 MED FILL — DICLOFENAC SODIUM 1 % GEL: 1 | 30 days supply | Qty: 100 | Fill #0

## 2020-05-06 NOTE — Telephone Encounter (Signed)
Last apt 01/22/2020

## 2020-05-06 NOTE — Telephone Encounter (Signed)
With last appointment 01/22/2020 Ritalin 5 mg IR dispensing 01/28/2020 as #270, the [facility] record listing data eScription as April 14 instead of July 14 was likely looking at the last day of the preceding 90-day supply.  Supply is currently due medically necessary no contraindication in the last year per epic or [facility] registry sent as #270 to [facility]

## 2020-05-20 ENCOUNTER — Encounter (INDEPENDENT_AMBULATORY_CARE_PROVIDER_SITE_OTHER): Payer: Self-pay

## 2020-05-20 DIAGNOSIS — R002 Palpitations: Secondary | ICD-10-CM

## 2020-06-01 IMAGING — DX RIGHT SHOULDER - 2+ VIEW
4 series · 4 of 4 positions shown · non-contrast
Comparison: None.

CLINICAL DATA: 47 y/o F; persistent right shoulder pain. Fall
05/06/2018.

EXAM:
RIGHT SHOULDER - 2+ VIEW

[shoulder ap (1 of 2)]
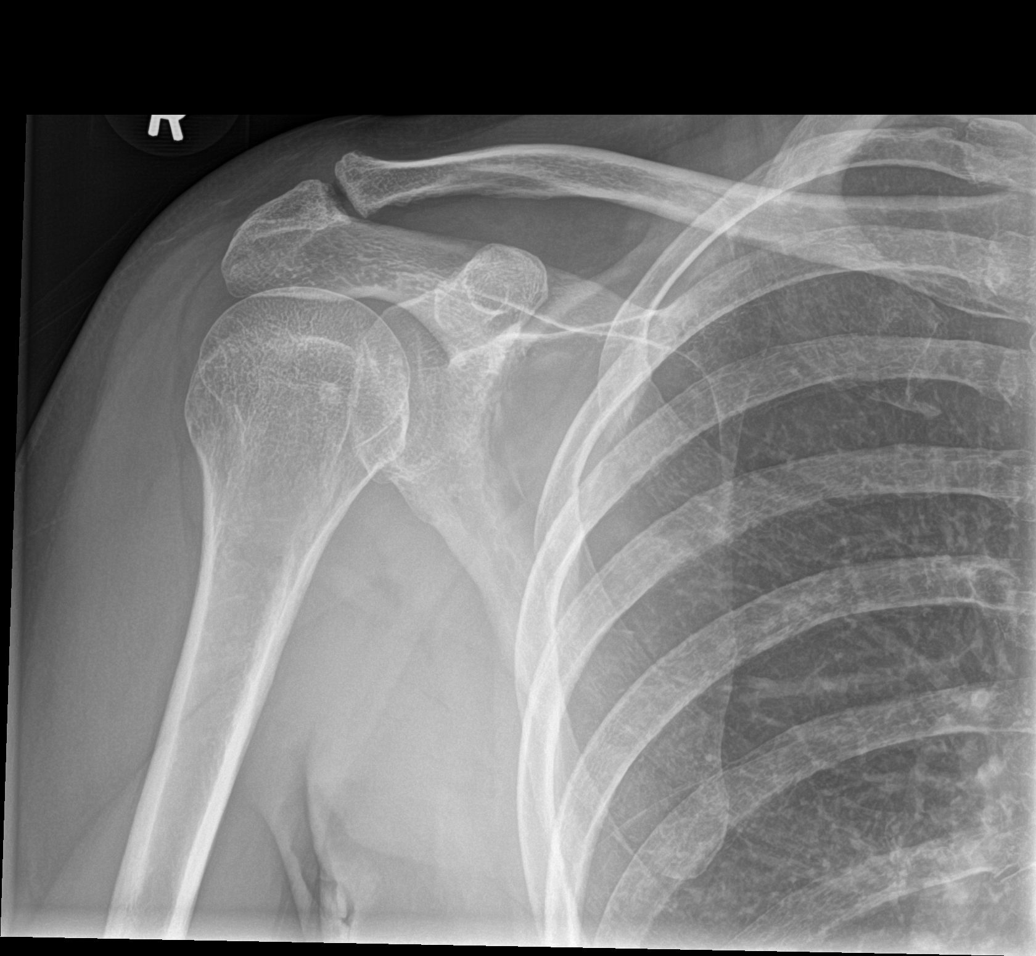

[shoulder ap (2 of 2)]
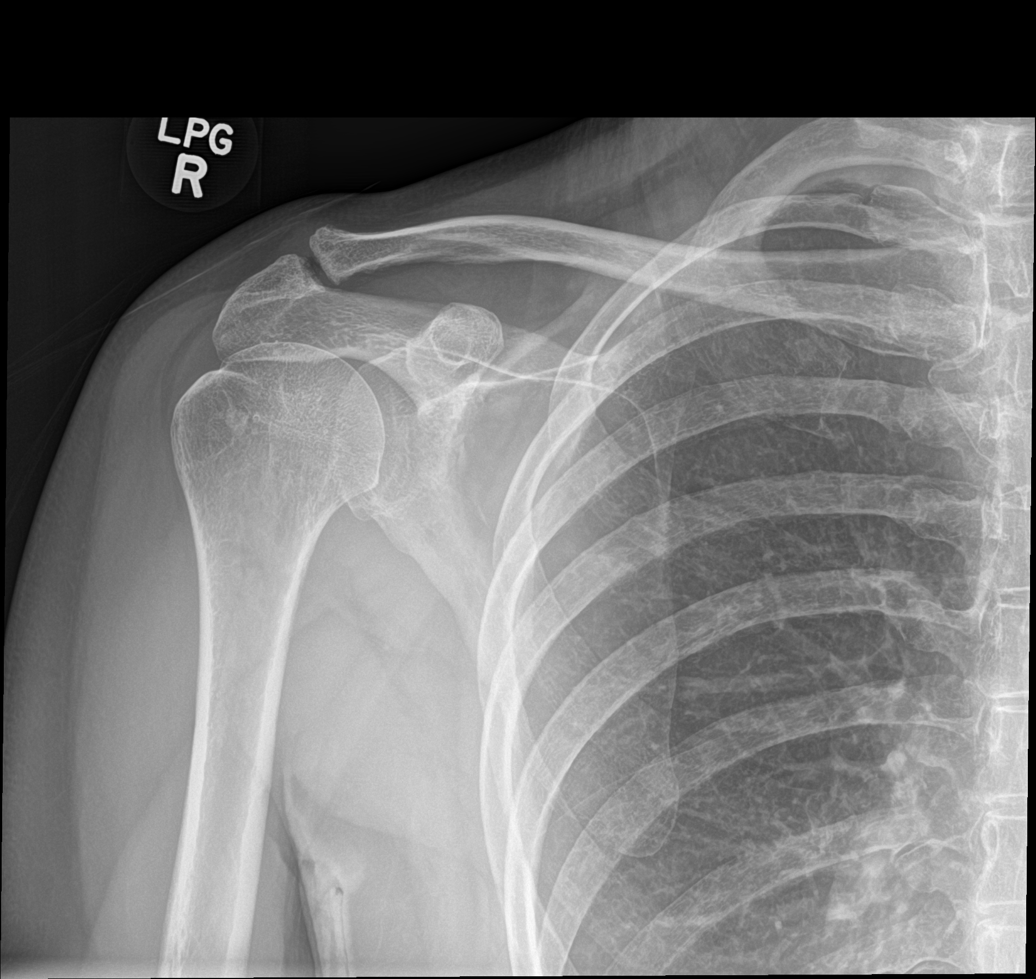

[shoulder y-view]
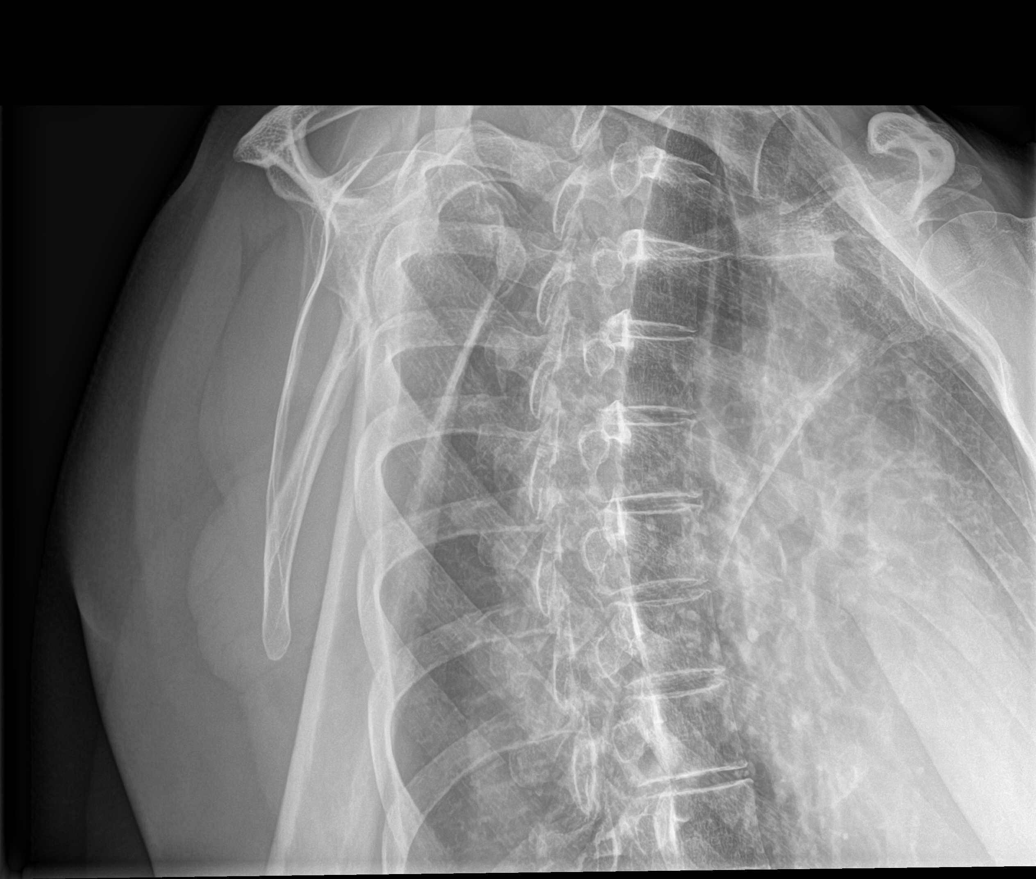

[shoulder axial]
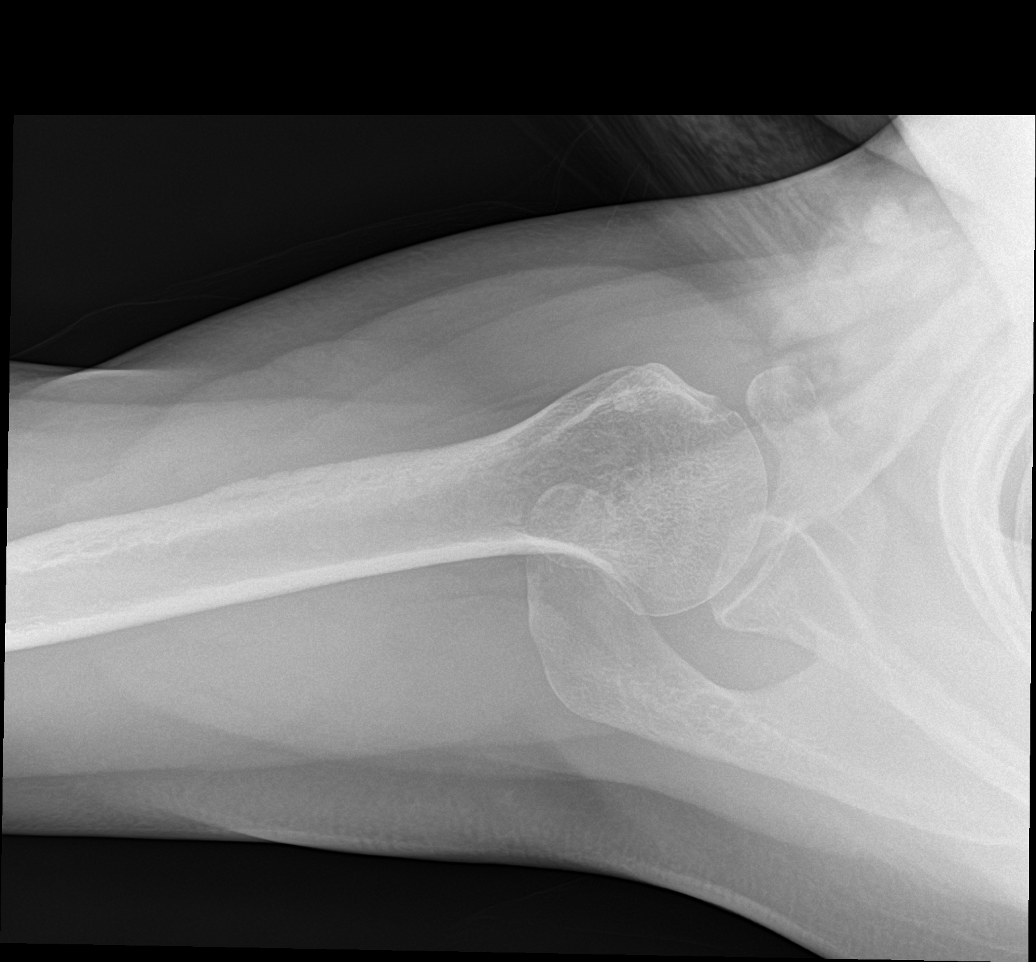

[4 of 4 positions shown; findings below may reference images not displayed]

FINDINGS: There is no evidence of fracture or dislocation. There is no
evidence of arthropathy or other focal bone abnormality. Soft
tissues are unremarkable.
IMPRESSION: Negative.

## 2020-06-16 ENCOUNTER — Encounter: Payer: Self-pay | Admitting: Psychiatry

## 2020-06-16 ENCOUNTER — Other Ambulatory Visit: Payer: Self-pay

## 2020-06-16 ENCOUNTER — Ambulatory Visit (INDEPENDENT_AMBULATORY_CARE_PROVIDER_SITE_OTHER): Payer: No Typology Code available for payment source | Admitting: Psychiatry

## 2020-06-16 ENCOUNTER — Other Ambulatory Visit: Payer: Self-pay | Admitting: Psychiatry

## 2020-06-16 VITALS — Ht 69.0 in | Wt 170.0 lb

## 2020-06-16 DIAGNOSIS — F422 Mixed obsessional thoughts and acts: Secondary | ICD-10-CM

## 2020-06-16 DIAGNOSIS — F411 Generalized anxiety disorder: Secondary | ICD-10-CM | POA: Diagnosis not present

## 2020-06-16 DIAGNOSIS — F900 Attention-deficit hyperactivity disorder, predominantly inattentive type: Secondary | ICD-10-CM

## 2020-06-16 DIAGNOSIS — F4310 Post-traumatic stress disorder, unspecified: Secondary | ICD-10-CM | POA: Diagnosis not present

## 2020-06-16 MED ORDER — ESCITALOPRAM OXALATE 20 MG PO TABS: 20.0000 mg | ORAL_TABLET | Freq: Every day | ORAL | 1 refills | Status: DC

## 2020-06-16 MED ORDER — METHYLPHENIDATE HCL 5 MG PO TABS: 5.0000 mg | ORAL_TABLET | Freq: Three times a day (TID) | ORAL | 0 refills | Status: DC

## 2020-06-16 MED ORDER — DIAZEPAM 2 MG PO TABS: 2.0000 mg | ORAL_TABLET | Freq: Two times a day (BID) | ORAL | 0 refills | Status: DC | PRN

## 2020-06-16 MED FILL — diazePAM 2 MG TABS: 2 | 30 days supply | Qty: 60 | Fill #0

## 2020-06-16 NOTE — Progress Notes (Signed)
[name]  Patient ID: Shannon Swanson,  MRN: [id number]  PCP: Patient, No Pcp Per  Date of Evaluation: 06/16/2020 Time spent:25 minutes from 1100 to 1125  Chief Complaint:  Chief Complaint    Anxiety; Trauma; ADHD      HISTORY/CURRENT STATUS: Shannon Swanson is seen Onsite in office 25 minutes face-to-face individually with consent with epic collateral for psychiatric interview and exam in 63-month evaluation and management of now fully disclosed PTSD comorbid with previously treated ADHD/OCD and generalized anxiety with limited symptom panic.  In previous appointments over the past year, patient has reported anxiety to be worse since injury on the job.  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  As the patient's orthopedic care has documented that there is a persisting now discoverable tear in the right shoulder capsule and her neck will require surgery, the patient is required by orthopedics now to fully define her on-the-job psychiatric injury and necessary care.  She describes fully to the orthopedist and here flashbacks triggered by even just standing in the hall near teens at the behavioral health hospital.  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 Even simply giving vaccines around young people triggers anxious flashbacks. She has  not started psychotherapy but now has defined in the course of her orthopedic management the need for EMDR or brain spotting having nightmares and daytime flashbacks especially triggered by active teens or certain medical settings.  Ortho is requiring a second opinion for shoulder and she is scheduled for neck surgery in January if shoulder surgery does not change the plan.  She has also seen cardiology having a Holter report pending for extrasystoles after epidural injections which she questions may be due to Ritalin therefore she has been using less Ritalin even when she needs it on the job.  She does continue her Lexapro but never started the Valium she filled 07/23/2019 as per [facility] registry now expecting that supply expired and requesting replacement.  Daughter was seen earlier this morning and son within the last few weeks for their anxieties.  She understands my case closure for imminent retirement and prefers transfer transition to advanced practitioner in this office.  She has no mania, suicidality, psychosis or delirium.   Anxiety Presents forfollow-upvisit. Symptoms includereexperiencing flashbacks with associated prepanic,obsessive slowness cognitively,ritualized rechecking actsfor worrisomedoubts,decreased concentration,excessive worry,nervous/anxious behavior,insomnia,andavoidance. Patient reports nodepressed mood,perceptual distortions,or suicidal ideas. Symptoms occurmost days. The severity of symptoms ismoderate. The quality of sleep isfair. Nighttime awakenings:occasional. Compliance with medications is51-75%. Side effects of treatment includeGI discomfort.  Individual Medical History/ Review of Systems: Changes? :Yes Cardiology report for Holter is apparently pending from appointment end of October for palpitations, patient worried that Ritalin is increasing or causing such symptoms as it may also upset stomach.  She takes her Lexapro but has not taken her Valium  though she does have other medications  from orthopedics and primary care.  She expects neck surgery January 10 and possibly right shoulder capsule surgery.  Weight is up 1 pound in 5 months.  Allergies: Meperidine hcl and Codeine  Current Medications:  Current Outpatient Medications:  .  acetaminophen (TYLENOL) 500 MG tablet, Take 500 mg by mouth every 6 (six) hours as needed., Disp: , Rfl:  .  alum hydroxide-mag trisilicate (GAVISCON) 75-10 MG CHEW chewable tablet, Chew by mouth., Disp: , Rfl:  .  Cyanocobalamin 1000 MCG SUBL, Place 1 tablet (1,000 mcg total) under the tongue daily., Disp: 100 tablet, Rfl: 11 .  diazepam (VALIUM) 2 MG tablet, Take 1 tablet (2 mg total) by mouth 2 (two) times daily as needed for anxiety (Panic attack; flashback)., Disp: 60 tablet, Rfl: 0 .  escitalopram (LEXAPRO) 20 MG tablet, Take 1 tablet (20 mg total) by mouth at bedtime., Disp: 90 tablet, Rfl: 1 .  gabapentin (NEURONTIN) 250 MG/5ML solution, Take 300 mg by mouth at bedtime., Disp: , Rfl:  .  ibuprofen (ADVIL) 800 MG tablet, Take 800 mg by mouth every 8 (eight) hours as needed., Disp: , Rfl:  .  lactase (LACTAID) 3000 UNITS tablet, Take 1 tablet by mouth 3 (three) times daily with meals., Disp: , Rfl:  .  [START ON 08/03/2020] methylphenidate (RITALIN) 5 MG tablet, Take 1 tablet (5 mg total) by mouth 3 (three) times daily., Disp: 270 tablet, Rfl: 0 .  tiZANidine (ZANAFLEX) 2 MG tablet, , Disp: , Rfl:   Medication Side Effects: anxiety and GI irritation  Family Medical/ Social History: Changes? No husband deceased since Father's Day 2020 with osteosarcoma after a protracted battle.  Maternal grandfather had substance abuse with alcohol.  Son has OCD/ADHD and daughter has generalized anxiety with atypical dysthymia.  MENTAL HEALTH EXAM:  Height 5\' 9"  (1.753 m), weight 170 lb (77.1 kg).Body mass index is 25.1 kg/m. Muscle strengths and tone 5/5, postural reflexes and gait 0/0, and AIMS = 0.  General  Appearance: Casual, Guarded, Meticulous and Well Groomed  Eye Contact:  Fair  Speech:  Clear and Coherent, Normal Rate, Talkative and circumstantially detailed  Volume:  Normal  Mood:  Anxious, Euthymic and Worthless  Affect:  Congruent, Inappropriate, Restricted and Anxious  Thought Process:  Coherent, Goal Directed, Irrelevant, Linear and Descriptions of Associations: Circumstantial  Orientation:  Full (Time, Place, and Person)  Thought Content: Obsessions and Rumination   Suicidal Thoughts:  No  Homicidal Thoughts:  No  Memory:  Immediate;   Good Remote;   Good  Judgement:  Good  Insight:  Fair  Psychomotor Activity:  Normal and Mannerisms  Concentration:  Concentration: Fair and Attention Span: Fair  Recall:  [employer] of Knowledge: Good  Language: Good  Assets:  Desire for Improvement Resilience Social Support Talents/Skills  ADL's:  Intact  Cognition: WNL  Prognosis:  Good    DIAGNOSES:    ICD-10-CM   1. Post traumatic stress disorder (PTSD)  F43.10   2. ADHD, predominantly inattentive type  F90.0 methylphenidate (RITALIN) 5 MG tablet  3. Mixed obsessional thoughts and acts  F42.2 escitalopram (LEXAPRO) 20 MG tablet    diazepam (VALIUM) 2 MG tablet  4. Generalized anxiety disorder  F41.1 escitalopram (LEXAPRO) 20 MG tablet    diazepam (VALIUM) 2 MG tablet    Receiving Psychotherapy: No but needs EMDR with Georgana Curio, [facility] or Lina Sayre, [facility] or elsewhere in the system or community   RECOMMENDATIONS: Trauma focused cognitive behavioral interventions are formulated with symptom  treatment matching for medications as patient finds all treatment anxiety provoking as she also does the absence of treatment.  The patient is more specific about past trauma than in the past but still limited toward time, date, place, and extent of accident and injury.  She seems to have defined this most with orthopedics, and her anxiety appears to likely delay their completion of diagnostic  and therapeutic interventions.  She does agree to continue Lexapro and Ritalin only allowing Valium to be on hand as needed possibly particularly for psychotherapy.  We process psychotherapy as possibly available in this office as well as the community.  She is E scribed Lexapro 20 mg every bedtime sent to [facility] as #90 with 3 refills for PTSD, OCD, and generalized anxiety.  She is E scribed Ritalin 5 mg IR tablet 3 times daily sent as #270 with no refill to [facility] for ADHD.  She is E scribed Valium 2 mg twice daily as needed for flashbacks or prepanic anxiety sent as #60 with no refill to [facility] for PTSD and GAD.  She will transfer transition to [name], PMHNP for my case closure in retirement to be seen in 3 months for follow-up.   Delight Hoh, MD

## 2020-06-18 ENCOUNTER — Other Ambulatory Visit: Payer: Self-pay | Admitting: Orthopedic Surgery

## 2020-06-23 ENCOUNTER — Other Ambulatory Visit (HOSPITAL_COMMUNITY): Payer: Self-pay | Admitting: Neurosurgery

## 2020-06-23 DIAGNOSIS — M5412 Radiculopathy, cervical region: Secondary | ICD-10-CM

## 2020-06-27 ENCOUNTER — Other Ambulatory Visit: Payer: Self-pay

## 2020-06-27 ENCOUNTER — Ambulatory Visit (HOSPITAL_COMMUNITY)
Admission: RE | Admit: 2020-06-27 | Discharge: 2020-06-27 | Disposition: A | Discharge status: Home / Self Care | Payer: PRIVATE HEALTH INSURANCE | Source: Ambulatory Visit | Attending: Neurosurgery | Admitting: Neurosurgery

## 2020-06-27 DIAGNOSIS — M5412 Radiculopathy, cervical region: Secondary | ICD-10-CM | POA: Diagnosis not present

## 2020-07-01 MED FILL — METHYLPREDNISOLONE 4 MG TBP: 4 | 6 days supply | Qty: 21 | Fill #0

## 2020-07-02 ENCOUNTER — Ambulatory Visit (HOSPITAL_COMMUNITY): Payer: Self-pay

## 2020-07-02 ENCOUNTER — Encounter (HOSPITAL_COMMUNITY): Payer: Self-pay

## 2020-07-11 DIAGNOSIS — I499 Cardiac arrhythmia, unspecified: Secondary | ICD-10-CM

## 2020-07-11 HISTORY — DX: Cardiac arrhythmia, unspecified: I49.9

## 2020-07-14 ENCOUNTER — Encounter (HOSPITAL_BASED_OUTPATIENT_CLINIC_OR_DEPARTMENT_OTHER): Payer: Self-pay | Admitting: Orthopedic Surgery

## 2020-07-16 ENCOUNTER — Other Ambulatory Visit (HOSPITAL_COMMUNITY): Payer: No Typology Code available for payment source

## 2020-07-20 ENCOUNTER — Ambulatory Visit (HOSPITAL_BASED_OUTPATIENT_CLINIC_OR_DEPARTMENT_OTHER)
Admission: RE | Admit: 2020-07-20 | Payer: PRIVATE HEALTH INSURANCE | Source: Home / Self Care | Admitting: Orthopedic Surgery

## 2020-07-20 SURGERY — SHOULDER ARTHROSCOPY WITH SUBACROMIAL DECOMPRESSION
Anesthesia: Choice | Laterality: Right

## 2020-07-23 ENCOUNTER — Ambulatory Visit: Payer: No Typology Code available for payment source | Admitting: Psychiatry

## 2020-07-23 ENCOUNTER — Other Ambulatory Visit: Payer: Self-pay | Admitting: Neurosurgery

## 2020-07-31 ENCOUNTER — Other Ambulatory Visit (HOSPITAL_COMMUNITY): Payer: Self-pay | Admitting: Neurosurgery

## 2020-07-31 MED FILL — traMADol HCL 50 MG TABS: 50 | 10 days supply | Qty: 40 | Fill #0

## 2020-08-03 ENCOUNTER — Other Ambulatory Visit (HOSPITAL_COMMUNITY): Payer: Self-pay | Admitting: Family Medicine

## 2020-08-03 MED FILL — ONDANSETRON ODT 4 MG TABLET: 4 | 5 days supply | Qty: 15 | Fill #0

## 2020-08-03 MED FILL — BENZONATATE 200 MG CAP: 200 | 10 days supply | Qty: 30 | Fill #0

## 2020-08-03 NOTE — Progress Notes (Signed)
[place] [place] [address] [place] [address] Phone: [phone] Fax: [phone]      Your procedure is scheduled on 08/10/2020.  Report to [facility] Main Entrance "A" at 11:45 A.M., and check in at the Admitting office.  Call this number if you have problems the morning of surgery:  [phone]  Call [phone] if you have any questions prior to your surgery date Monday-Friday 8am-4pm    Remember:  Do not eat or drink after midnight the night before your surgery  You may drink clear liquids until 10:45am the morning of your surgery.   Clear liquids allowed are: Water, Non-Citrus Juices (without pulp), Carbonated Beverages, Clear Tea, Black Coffee Only, and Gatorade    Take these medicines the morning of surgery with A SIP OF WATER  diazepam (VALIUM)- as needed methylphenidate (RITALIN)  As of today, STOP taking any Aspirin (unless otherwise instructed by your surgeon) Aleve, Naproxen, Ibuprofen, Motrin, Advil, Goody's, BC's, all herbal medications, fish oil, and all vitamins.                      Do not wear jewelry, make up, or nail polish            Do not wear lotions, powders, perfumes/colognes, or deodorant.            Do not shave 48 hours prior to surgery.  Men may shave face and neck.            Do not bring valuables to the hospital.            [facility] is not responsible for any belongings or valuables.  Do NOT Smoke (Tobacco/Vaping) or drink Alcohol 24 hours prior to your procedure If you use a CPAP at night, you may bring all equipment for your overnight stay.   Contacts, glasses, dentures or bridgework may not be worn into surgery.      For patients admitted to the hospital, discharge time will be determined by your treatment team.   Patients discharged the day of surgery will not be allowed to drive home, and someone needs to stay with them for 24 hours.    Special instructions:    [facility]- Preparing For Surgery  Before surgery, you can play an important role. Because skin is not sterile, your skin needs to be as free of germs as possible. You can reduce the number of germs on your skin by washing with CHG (chlorahexidine gluconate) Soap before surgery.  CHG is an antiseptic cleaner which kills germs and bonds with the skin to continue killing germs even after washing.    Oral Hygiene is also important to reduce your risk of infection.  Remember - BRUSH YOUR TEETH THE MORNING OF SURGERY WITH YOUR REGULAR TOOTHPASTE  Please do not use if you have an allergy to CHG or antibacterial soaps. If your skin becomes reddened/irritated stop using the CHG.  Do not shave (including legs and underarms) for at least 48 hours prior to first CHG shower. It is OK to shave your face.  Please follow these instructions carefully.   1. Shower the NIGHT BEFORE SURGERY and the MORNING OF SURGERY with CHG Soap.   2. If you chose to wash your hair, wash your hair first as usual with your normal shampoo.  3. After you shampoo, rinse your hair and body thoroughly to remove the shampoo.  4. Use CHG  as you would any other liquid soap. You can apply CHG directly to the skin and wash gently with a scrungie or a clean washcloth.   5. Apply the CHG Soap to your body ONLY FROM THE NECK DOWN.  Do not use on open wounds or open sores. Avoid contact with your eyes, ears, mouth and genitals (private parts). Wash Face and genitals (private parts)  with your normal soap.   6. Wash thoroughly, paying special attention to the area where your surgery will be performed.  7. Thoroughly rinse your body with warm water from the neck down.  8. DO NOT shower/wash with your normal soap after using and rinsing off the CHG Soap.  9. Pat yourself dry with a CLEAN TOWEL.  10. Wear CLEAN PAJAMAS to bed the night before surgery  11. Place CLEAN SHEETS on your bed the night of your first shower and DO NOT SLEEP  WITH PETS.   Day of Surgery: Wear Clean/Comfortable clothing the morning of surgery Do not apply any deodorants/lotions.   Remember to brush your teeth WITH YOUR REGULAR TOOTHPASTE.   Please read over the following fact sheets that you were given.

## 2020-08-04 ENCOUNTER — Encounter (HOSPITAL_COMMUNITY)
Admission: RE | Admit: 2020-08-04 | Discharge: 2020-08-04 | Disposition: A | Discharge status: Home / Self Care | Payer: PRIVATE HEALTH INSURANCE | Source: Ambulatory Visit | Attending: Neurosurgery | Admitting: Neurosurgery

## 2020-08-04 ENCOUNTER — Encounter (HOSPITAL_COMMUNITY): Payer: Self-pay

## 2020-08-04 ENCOUNTER — Other Ambulatory Visit: Payer: Self-pay

## 2020-08-04 DIAGNOSIS — Z01812 Encounter for preprocedural laboratory examination: Secondary | ICD-10-CM | POA: Diagnosis present

## 2020-08-04 HISTORY — DX: Personal history of urinary calculi: Z87.442

## 2020-08-04 HISTORY — DX: Family history of other specified conditions: Z84.89

## 2020-08-04 HISTORY — DX: Depression, unspecified: F32.A

## 2020-08-04 HISTORY — DX: Cardiac arrhythmia, unspecified: I49.9

## 2020-08-04 LAB — BASIC METABOLIC PANEL
Anion gap: 9 (ref 5–15)
BUN: 8 mg/dL (ref 6–20)
CO2: 27 mmol/L (ref 22–32)
Calcium: 9.1 mg/dL (ref 8.9–10.3)
Chloride: 105 mmol/L (ref 98–111)
Creatinine, Ser: 0.81 mg/dL (ref 0.44–1.00)
GFR, Estimated: >60 mL/min (ref >60)
Glucose, Bld: 95 mg/dL (ref 70–99)
Potassium: 3.9 mmol/L (ref 3.5–5.1)
Sodium: 141 mmol/L (ref 135–145)

## 2020-08-04 LAB — CBC
HCT: 40.7 % (ref 36.0–46.0)
Hemoglobin: 13 g/dL (ref 12.0–15.0)
MCH: 29.5 pg (ref 26.0–34.0)
MCHC: 31.9 g/dL (ref 30.0–36.0)
MCV: 92.3 fL (ref 80.0–100.0)
Platelets: 379 10*3/uL (ref 150–400)
RBC: 4.41 MIL/uL (ref 3.87–5.11)
RDW: 11.9 % (ref 11.5–15.5)
WBC: 7.2 10*3/uL (ref 4.0–10.5)
nRBC: 0 % (ref 0.0–0.2)

## 2020-08-04 LAB — TYPE AND SCREEN
ABO/RH(D): A POS
Antibody Screen: NEGATIVE

## 2020-08-04 LAB — SURGICAL PCR SCREEN
MRSA, PCR: NEGATIVE
Staphylococcus aureus: NEGATIVE

## 2020-08-04 NOTE — Pre-Procedure Instructions (Signed)
Your procedure is scheduled on Monday, 08/10/20 (1:38 PM- 4:20 PM).  Report to [facility] Main Entrance "A" at 11:45 A.M., and check in at the Admitting office.  Call this number if you have problems the morning of surgery:  [phone]  Call [phone] if you have any questions prior to your surgery date Monday-Friday 8am-4pm.    Remember:  Do not eat or drink after midnight the night before your surgery.     Take these medicines the morning of surgery with A SIP OF WATER:  diazepam (VALIUM)- if needed  As of today, STOP taking any Aspirin (unless otherwise instructed by your surgeon), diclofenac Sodium (VOLTAREN) gel, Aleve, Naproxen, Ibuprofen, Motrin, Advil, Goody's, BC's, all herbal medications, fish oil, and all vitamins.           The Morning of Surgery:            Do not wear jewelry, make up, or nail polish.            Do not wear lotions, powders, perfumes, or deodorant.            Do not shave 48 hours prior to surgery.              Do not bring valuables to the hospital.            [facility] is not responsible for any belongings or valuables.  Do NOT Smoke (Tobacco/Vaping) or drink Alcohol 24 hours prior to your procedure.  If you use a CPAP at night, you may bring all equipment for your overnight stay.   Contacts, glasses, dentures or bridgework may not be worn into surgery.      For patients admitted to the hospital, discharge time will be determined by your treatment team.   Patients discharged the day of surgery will not be allowed to drive home, and someone needs to stay with them for 24 hours.    Special instructions:   [facility]- Preparing For Surgery  Before surgery, you can play an important role. Because skin is not sterile, your skin needs to be as free of germs as possible. You can reduce the number of germs on your skin by washing with CHG (chlorahexidine gluconate) Soap before surgery.  CHG is an antiseptic cleaner which kills germs and  bonds with the skin to continue killing germs even after washing.    Oral Hygiene is also important to reduce your risk of infection.  Remember - BRUSH YOUR TEETH THE MORNING OF SURGERY WITH YOUR REGULAR TOOTHPASTE  Please do not use if you have an allergy to CHG or antibacterial soaps. If your skin becomes reddened/irritated stop using the CHG.  Do not shave (including legs and underarms) for at least 48 hours prior to first CHG shower. It is OK to shave your face.  Please follow these instructions carefully.   1. Shower the NIGHT BEFORE SURGERY and the MORNING OF SURGERY with CHG Soap.   2. If you chose to wash your hair, wash your hair first as usual with your normal shampoo.  3. After you shampoo, rinse your hair and body thoroughly to remove the shampoo.  4. Use CHG as you would any other liquid soap. You can apply CHG directly to the skin and wash gently with a scrungie or a clean washcloth.   5. Apply the CHG Soap to your body ONLY FROM THE NECK DOWN.  Do not use on open wounds or open sores. Avoid contact  with your eyes, ears, mouth and genitals (private parts). Wash Face and genitals (private parts)  with your normal soap.   6. Wash thoroughly, paying special attention to the area where your surgery will be performed.  7. Thoroughly rinse your body with warm water from the neck down.  8. DO NOT shower/wash with your normal soap after using and rinsing off the CHG Soap.  9. Pat yourself dry with a CLEAN TOWEL.  10. Wear CLEAN PAJAMAS to bed the night before surgery  11. Place CLEAN SHEETS on your bed the night of your first shower and DO NOT SLEEP WITH PETS.   Day of Surgery: Shower as instructed. Wear Clean/Comfortable clothing the morning of surgery Do not apply any deodorants/lotions.   Remember to brush your teeth WITH YOUR REGULAR TOOTHPASTE.   Please read over the following fact sheets that you were given.

## 2020-08-04 NOTE — Progress Notes (Signed)
No Procedure Orders. [facility] Dr. Saintclair Halsted.

## 2020-08-04 NOTE — Progress Notes (Addendum)
PCP - Maura L. Hamrick, MD Cardiologist - Denies  PPM/ICD - Denies  Chest x-ray - N/A EKG - N/A Stress Test - Denies ECHO - Per patient, > 10 years ago. Cardiac Cath - Denies  Sleep Study - Yes, positive for OSA CPAP - No  Patient denies being diabetic.  Blood Thinner Instructions: N/A Aspirin Instructions: N/A  ERAS Protcol - N/A PRE-SURGERY Ensure or G2- N/A  COVID POSITIVE 07/27/20; Tested through [facility]. Hard Copy of results in Physical Chart. During PAT appointment, patient reports a lingering, dry, non-productive cough, which she says is improving. She states previous symptoms as fever, nausea, chills, fatigue, and headache.  Anesthesia review: No  Patient denies shortness of breath, fever, cough and chest pain at PAT appointment   All instructions explained to the patient, with a verbal understanding of the material. Patient agrees to go over the instructions while at home for a better understanding. Patient also instructed to self quarantine after being tested for COVID-19. The opportunity to ask questions was provided.

## 2020-08-07 ENCOUNTER — Other Ambulatory Visit (HOSPITAL_COMMUNITY): Payer: No Typology Code available for payment source

## 2020-08-11 DIAGNOSIS — T8859XA Other complications of anesthesia, initial encounter: Secondary | ICD-10-CM

## 2020-08-11 DIAGNOSIS — I8290 Acute embolism and thrombosis of unspecified vein: Secondary | ICD-10-CM

## 2020-08-11 HISTORY — DX: Acute embolism and thrombosis of unspecified vein: I82.90

## 2020-08-11 HISTORY — DX: Other complications of anesthesia, initial encounter: T88.59XA

## 2020-09-01 NOTE — Progress Notes (Signed)
Surgical Instructions   Your procedure is scheduled on Monday, February 28th.  Report to [facility] Main Entrance "A" at 9:10 A.M., then check in with the Admitting office.  Call this number if you have problems the morning of surgery:  [phone]   If you have any questions prior to your surgery date call [phone]: Open Monday-Friday 8am-4pm   Remember:  Do not eat or drink after midnight the night before your surgery    Take these medicines the morning of surgery with A SIP OF WATER   If needed: diazepam (VALIUM), traMADol (ULTRAM)   As of today, STOP using diclofenac Sodium, STOP taking any Aspirin (unless otherwise instructed by your surgeon) Aleve, Naproxen, Ibuprofen, Motrin, Advil, Goody's, BC's, all herbal medications, fish oil, and all vitamins.                     Do not wear jewelry, make up, or nail polish            Do not wear lotions, powders, perfumes, or deodorant.            Do not shave 48 hours prior to surgery.              Do not bring valuables to the hospital.            [facility] is not responsible for any belongings or valuables.  Do NOT Smoke (Tobacco/Vaping) or drink Alcohol 24 hours prior to your procedure If you use a CPAP at night, you may bring all equipment for your overnight stay.   Contacts, glasses, dentures or bridgework may not be worn into surgery, please bring cases for these belongings   For patients admitted to the hospital, discharge time will be determined by your treatment team.   Patients discharged the day of surgery will not be allowed to drive home, and someone needs to stay with them for 24 hours.  Special instructions:   [facility]- Preparing For Surgery  Before surgery, you can play an important role. Because skin is not sterile, your skin needs to be as free of germs as possible. You can reduce the number of germs on your skin by washing with CHG (chlorahexidine gluconate) Soap before surgery.  CHG is an antiseptic  cleaner which kills germs and bonds with the skin to continue killing germs even after washing.    Oral Hygiene is also important to reduce your risk of infection.  Remember - BRUSH YOUR TEETH THE MORNING OF SURGERY WITH YOUR REGULAR TOOTHPASTE  Please do not use if you have an allergy to CHG or antibacterial soaps. If your skin becomes reddened/irritated stop using the CHG.  Do not shave (including legs and underarms) for at least 48 hours prior to first CHG shower. It is OK to shave your face.  Please follow these instructions carefully.   1. Shower the NIGHT BEFORE SURGERY and the MORNING OF SURGERY  2. If you chose to wash your hair, wash your hair first as usual with your normal shampoo.  3. After you shampoo, rinse your hair and body thoroughly to remove the shampoo.  4. Wash Face and genitals (private parts) with your normal soap.   5.  Shower the NIGHT BEFORE SURGERY and the MORNING OF SURGERY with CHG Soap.   6. Apply the CHG Soap to your body ONLY FROM THE NECK DOWN.  Do not use on open wounds or open sores. Avoid contact with your eyes, ears, mouth  and genitals (private parts). Wash Face and genitals (private parts)  with your normal soap.   7. Wash thoroughly, paying special attention to the area where your surgery will be performed.  8. Thoroughly rinse your body with warm water from the neck down.  9. DO NOT shower/wash with your normal soap after using and rinsing off the CHG Soap.  10. Pat yourself dry with a CLEAN TOWEL.  11. Wear CLEAN PAJAMAS to bed the night before surgery  12. Place CLEAN SHEETS on your bed the night before your surgery  13. DO NOT SLEEP WITH PETS.  Day of Surgery: Shower with CHG soap Wear Clean/Comfortable clothing the morning of surgery Do not apply any deodorants/lotions.   Remember to brush your teeth WITH YOUR REGULAR TOOTHPASTE.   Please read over the following fact sheets that you were given.

## 2020-09-02 ENCOUNTER — Other Ambulatory Visit: Payer: Self-pay

## 2020-09-02 ENCOUNTER — Encounter (HOSPITAL_COMMUNITY)
Admission: RE | Admit: 2020-09-02 | Discharge: 2020-09-02 | Disposition: A | Discharge status: Home / Self Care | Payer: PRIVATE HEALTH INSURANCE | Source: Ambulatory Visit | Attending: Neurosurgery | Admitting: Neurosurgery

## 2020-09-02 ENCOUNTER — Encounter (HOSPITAL_COMMUNITY): Payer: Self-pay

## 2020-09-02 DIAGNOSIS — Z01812 Encounter for preprocedural laboratory examination: Secondary | ICD-10-CM | POA: Diagnosis present

## 2020-09-02 HISTORY — DX: Neuralgia and neuritis, unspecified: M79.2

## 2020-09-02 LAB — CBC
HCT: 40.6 % (ref 36.0–46.0)
Hemoglobin: 13.2 g/dL (ref 12.0–15.0)
MCH: 29.9 pg (ref 26.0–34.0)
MCHC: 32.5 g/dL (ref 30.0–36.0)
MCV: 92.1 fL (ref 80.0–100.0)
Platelets: 293 10*3/uL (ref 150–400)
RBC: 4.41 MIL/uL (ref 3.87–5.11)
RDW: 12.3 % (ref 11.5–15.5)
WBC: 8.4 10*3/uL (ref 4.0–10.5)
nRBC: 0 % (ref 0.0–0.2)

## 2020-09-02 LAB — BASIC METABOLIC PANEL
Anion gap: 10 (ref 5–15)
BUN: 10 mg/dL (ref 6–20)
CO2: 26 mmol/L (ref 22–32)
Calcium: 9.2 mg/dL (ref 8.9–10.3)
Chloride: 105 mmol/L (ref 98–111)
Creatinine, Ser: 0.88 mg/dL (ref 0.44–1.00)
GFR, Estimated: >60 mL/min (ref >60)
Glucose, Bld: 97 mg/dL (ref 70–99)
Potassium: 4 mmol/L (ref 3.5–5.1)
Sodium: 141 mmol/L (ref 135–145)

## 2020-09-02 LAB — TYPE AND SCREEN
ABO/RH(D): A POS
Antibody Screen: NEGATIVE

## 2020-09-02 LAB — SURGICAL PCR SCREEN
MRSA, PCR: NEGATIVE
Staphylococcus aureus: NEGATIVE

## 2020-09-02 NOTE — Progress Notes (Signed)
PCP - Dr. Daiva Eves Cardiologist - denies  PPM/ICD - denies  Chest x-ray - N/A EKG - N/A Stress Test - denies  ECHO - patient stated she was unsure  Cardiac Cath - denies  Sleep Study - Yes, patient states she wears a mouth guard CPAP -  No  DM: denies  Blood Thinner Instructions: N/A Aspirin Instructions: N/A  ERAS Protcol - No  COVID TEST- Tested positive on 07/27/2020. Physical copy placed in chart. No retest needed per protocol, within 90 day window. Patient verbalized understanding of self-quarantine instructions.  Anesthesia review: No  Patient denies shortness of breath, fever, cough and chest pain at PAT appointment  All instructions explained to the patient, with a verbal understanding of the material. Patient agrees to go over the instructions while at home for a better understanding. Patient also instructed to self quarantine after being tested for COVID-19. The opportunity to ask questions was provided.

## 2020-09-07 ENCOUNTER — Ambulatory Visit (HOSPITAL_COMMUNITY): Payer: PRIVATE HEALTH INSURANCE | Admitting: Physician Assistant

## 2020-09-07 ENCOUNTER — Ambulatory Visit (HOSPITAL_COMMUNITY)
Admission: RE | Disposition: A | Discharge status: Home / Self Care | Payer: Self-pay | Source: Home / Self Care | Attending: Neurosurgery

## 2020-09-07 ENCOUNTER — Ambulatory Visit (HOSPITAL_BASED_OUTPATIENT_CLINIC_OR_DEPARTMENT_OTHER): Payer: Self-pay

## 2020-09-07 ENCOUNTER — Ambulatory Visit (HOSPITAL_COMMUNITY)
Admission: RE | Admit: 2020-09-07 | Discharge: 2020-09-08 | Disposition: A | Discharge status: Home / Self Care | Payer: PRIVATE HEALTH INSURANCE | Attending: Neurosurgery | Admitting: Neurosurgery

## 2020-09-07 ENCOUNTER — Other Ambulatory Visit: Payer: Self-pay

## 2020-09-07 ENCOUNTER — Ambulatory Visit (HOSPITAL_COMMUNITY): Payer: PRIVATE HEALTH INSURANCE | Admitting: Certified Registered Nurse Anesthetist

## 2020-09-07 ENCOUNTER — Encounter (HOSPITAL_COMMUNITY): Payer: Self-pay | Admitting: Neurosurgery

## 2020-09-07 DIAGNOSIS — Z791 Long term (current) use of non-steroidal anti-inflammatories (NSAID): Secondary | ICD-10-CM | POA: Insufficient documentation

## 2020-09-07 DIAGNOSIS — Z825 Family history of asthma and other chronic lower respiratory diseases: Secondary | ICD-10-CM | POA: Diagnosis not present

## 2020-09-07 DIAGNOSIS — Y939 Activity, unspecified: Secondary | ICD-10-CM | POA: Diagnosis not present

## 2020-09-07 DIAGNOSIS — Z833 Family history of diabetes mellitus: Secondary | ICD-10-CM | POA: Diagnosis not present

## 2020-09-07 DIAGNOSIS — E739 Lactose intolerance, unspecified: Secondary | ICD-10-CM | POA: Insufficient documentation

## 2020-09-07 DIAGNOSIS — Z888 Allergy status to other drugs, medicaments and biological substances status: Secondary | ICD-10-CM | POA: Diagnosis not present

## 2020-09-07 DIAGNOSIS — M4802 Spinal stenosis, cervical region: Secondary | ICD-10-CM | POA: Diagnosis not present

## 2020-09-07 DIAGNOSIS — Y990 Civilian activity done for income or pay: Secondary | ICD-10-CM | POA: Diagnosis not present

## 2020-09-07 DIAGNOSIS — M4722 Other spondylosis with radiculopathy, cervical region: Secondary | ICD-10-CM | POA: Insufficient documentation

## 2020-09-07 DIAGNOSIS — M50121 Cervical disc disorder at C4-C5 level with radiculopathy: Secondary | ICD-10-CM | POA: Insufficient documentation

## 2020-09-07 DIAGNOSIS — Z79899 Other long term (current) drug therapy: Secondary | ICD-10-CM | POA: Diagnosis not present

## 2020-09-07 DIAGNOSIS — Z8249 Family history of ischemic heart disease and other diseases of the circulatory system: Secondary | ICD-10-CM | POA: Diagnosis not present

## 2020-09-07 DIAGNOSIS — Z885 Allergy status to narcotic agent status: Secondary | ICD-10-CM | POA: Insufficient documentation

## 2020-09-07 DIAGNOSIS — X58XXXA Exposure to other specified factors, initial encounter: Secondary | ICD-10-CM | POA: Diagnosis not present

## 2020-09-07 DIAGNOSIS — Z419 Encounter for procedure for purposes other than remedying health state, unspecified: Secondary | ICD-10-CM

## 2020-09-07 DIAGNOSIS — Y929 Unspecified place or not applicable: Secondary | ICD-10-CM | POA: Insufficient documentation

## 2020-09-07 HISTORY — PX: ANTERIOR CERVICAL DECOMP/DISCECTOMY FUSION: SHX1161

## 2020-09-07 SURGERY — ANTERIOR CERVICAL DECOMPRESSION/DISCECTOMY FUSION 2 LEVEL/HARDWARE REMOVAL
Anesthesia: General | Site: Spine Cervical

## 2020-09-07 MED ORDER — HEMOSTATIC AGENTS (NO CHARGE) OPTIME
TOPICAL | Status: DC | PRN
Start: 2020-09-07 — End: 2020-09-07
  Administered 2020-09-07: 1 via TOPICAL

## 2020-09-07 MED ORDER — CELECOXIB 200 MG PO CAPS
200.0000 mg | ORAL_CAPSULE | Freq: Once | ORAL | Status: AC
  Administered 2020-09-07: 200 mg via ORAL
  Filled 2020-09-07: qty 1

## 2020-09-07 MED ORDER — FENTANYL CITRATE (PF) 250 MCG/5ML IJ SOLN
INTRAMUSCULAR | Status: AC
  Filled 2020-09-07: qty 5

## 2020-09-07 MED ORDER — ROCURONIUM BROMIDE 10 MG/ML (PF) SYRINGE
PREFILLED_SYRINGE | INTRAVENOUS | Status: DC | PRN
  Administered 2020-09-07: 100 mg via INTRAVENOUS

## 2020-09-07 MED ORDER — PANTOPRAZOLE SODIUM 40 MG IV SOLR: 40.0000 mg | Freq: Every day | INTRAVENOUS | Status: DC

## 2020-09-07 MED ORDER — CHLORHEXIDINE GLUCONATE CLOTH 2 % EX PADS: 6.0000 | MEDICATED_PAD | Freq: Once | CUTANEOUS | Status: DC

## 2020-09-07 MED ORDER — DEXAMETHASONE SODIUM PHOSPHATE 10 MG/ML IJ SOLN
10.0000 mg | Freq: Four times a day (QID) | INTRAMUSCULAR | Status: DC
  Administered 2020-09-07 – 2020-09-08 (×3): 10 mg via INTRAVENOUS
  Filled 2020-09-07 (×3): qty 1

## 2020-09-07 MED ORDER — ONDANSETRON HCL 4 MG/2ML IJ SOLN: 4.0000 mg | Freq: Four times a day (QID) | INTRAMUSCULAR | Status: DC | PRN

## 2020-09-07 MED ORDER — SODIUM CHLORIDE 0.9 % IV SOLN: 250.0000 mL | INTRAVENOUS | Status: DC

## 2020-09-07 MED ORDER — ACETAMINOPHEN 500 MG PO TABS
1000.0000 mg | ORAL_TABLET | Freq: Once | ORAL | Status: AC
  Administered 2020-09-07: 1000 mg via ORAL
  Filled 2020-09-07: qty 2

## 2020-09-07 MED ORDER — AMISULPRIDE (ANTIEMETIC) 5 MG/2ML IV SOLN: 10.0000 mg | Freq: Once | INTRAVENOUS | Status: DC | PRN

## 2020-09-07 MED ORDER — ACETAMINOPHEN 325 MG PO TABS: 650.0000 mg | ORAL_TABLET | ORAL | Status: DC | PRN

## 2020-09-07 MED ORDER — TRAMADOL HCL 50 MG PO TABS: 50.0000 mg | ORAL_TABLET | Freq: Four times a day (QID) | ORAL | Status: DC | PRN

## 2020-09-07 MED ORDER — CYCLOBENZAPRINE HCL 10 MG PO TABS
10.0000 mg | ORAL_TABLET | Freq: Three times a day (TID) | ORAL | Status: DC | PRN
  Administered 2020-09-07: 10 mg via ORAL
  Filled 2020-09-07: qty 1

## 2020-09-07 MED ORDER — THROMBIN 5000 UNITS EX SOLR
CUTANEOUS | Status: DC | PRN
  Administered 2020-09-07: 5000 [IU] via TOPICAL

## 2020-09-07 MED ORDER — LIDOCAINE 2% (20 MG/ML) 5 ML SYRINGE
INTRAMUSCULAR | Status: DC | PRN
  Administered 2020-09-07: 100 mg via INTRAVENOUS

## 2020-09-07 MED ORDER — DEXAMETHASONE SODIUM PHOSPHATE 10 MG/ML IJ SOLN
10.0000 mg | Freq: Once | INTRAMUSCULAR | Status: AC
  Administered 2020-09-07: 10 mg via INTRAVENOUS

## 2020-09-07 MED ORDER — SODIUM CHLORIDE 0.9% FLUSH: 3.0000 mL | INTRAVENOUS | Status: DC | PRN

## 2020-09-07 MED ORDER — OXYCODONE HCL 5 MG PO TABS
10.0000 mg | ORAL_TABLET | ORAL | Status: DC | PRN
  Administered 2020-09-07 – 2020-09-08 (×3): 10 mg via ORAL
  Filled 2020-09-07 (×3): qty 2

## 2020-09-07 MED ORDER — ONDANSETRON HCL 4 MG PO TABS: 4.0000 mg | ORAL_TABLET | Freq: Four times a day (QID) | ORAL | Status: DC | PRN

## 2020-09-07 MED ORDER — PROPOFOL 500 MG/50ML IV EMUL
INTRAVENOUS | Status: DC | PRN
  Administered 2020-09-07: 40 ug/kg/min via INTRAVENOUS

## 2020-09-07 MED ORDER — PHENYLEPHRINE HCL (PRESSORS) 10 MG/ML IV SOLN
INTRAVENOUS | Status: DC | PRN
  Administered 2020-09-07 (×2): 80 ug via INTRAVENOUS
  Administered 2020-09-07 (×2): 40 ug via INTRAVENOUS
  Administered 2020-09-07: 80 ug via INTRAVENOUS

## 2020-09-07 MED ORDER — ONDANSETRON HCL 4 MG/2ML IJ SOLN
INTRAMUSCULAR | Status: DC | PRN
  Administered 2020-09-07: 4 mg via INTRAVENOUS

## 2020-09-07 MED ORDER — LACTATED RINGERS IV SOLN: INTRAVENOUS | Status: DC

## 2020-09-07 MED ORDER — ACETAMINOPHEN 10 MG/ML IV SOLN
INTRAVENOUS | Status: AC
  Filled 2020-09-07: qty 100

## 2020-09-07 MED ORDER — SODIUM CHLORIDE 0.9% FLUSH: 3.0000 mL | Freq: Two times a day (BID) | INTRAVENOUS | Status: DC

## 2020-09-07 MED ORDER — ESCITALOPRAM OXALATE 20 MG PO TABS
20.0000 mg | ORAL_TABLET | Freq: Every day | ORAL | Status: DC
  Filled 2020-09-07: qty 1

## 2020-09-07 MED ORDER — SUGAMMADEX SODIUM 200 MG/2ML IV SOLN
INTRAVENOUS | Status: DC | PRN
  Administered 2020-09-07 (×2): 200 mg via INTRAVENOUS

## 2020-09-07 MED ORDER — CEFAZOLIN SODIUM-DEXTROSE 2-4 GM/100ML-% IV SOLN
2.0000 g | Freq: Three times a day (TID) | INTRAVENOUS | Status: DC
Start: 2020-09-07 — End: 2020-09-08
  Administered 2020-09-07 – 2020-09-08 (×2): 2 g via INTRAVENOUS
  Filled 2020-09-07 (×2): qty 100

## 2020-09-07 MED ORDER — ACETAMINOPHEN 10 MG/ML IV SOLN
1000.0000 mg | Freq: Once | INTRAVENOUS | Status: AC
  Administered 2020-09-07: 1000 mg via INTRAVENOUS

## 2020-09-07 MED ORDER — SCOPOLAMINE 1 MG/3DAYS TD PT72
1.0000 | MEDICATED_PATCH | TRANSDERMAL | Status: DC
  Administered 2020-09-07: 1.5 mg via TRANSDERMAL
  Filled 2020-09-07: qty 1

## 2020-09-07 MED ORDER — PHENOL 1.4 % MT LIQD: 1.0000 | OROMUCOSAL | Status: DC | PRN

## 2020-09-07 MED ORDER — LACTASE 3000 UNITS PO TABS
9000.0000 [IU] | ORAL_TABLET | Freq: Three times a day (TID) | ORAL | Status: DC
  Filled 2020-09-07 (×4): qty 3

## 2020-09-07 MED ORDER — FENTANYL CITRATE (PF) 100 MCG/2ML IJ SOLN: 25.0000 ug | INTRAMUSCULAR | Status: DC | PRN

## 2020-09-07 MED ORDER — THROMBIN 5000 UNITS EX SOLR
CUTANEOUS | Status: AC
  Filled 2020-09-07: qty 10000

## 2020-09-07 MED ORDER — CHLORHEXIDINE GLUCONATE 0.12 % MT SOLN
15.0000 mL | Freq: Once | OROMUCOSAL | Status: AC
  Administered 2020-09-07: 15 mL via OROMUCOSAL
  Filled 2020-09-07: qty 15

## 2020-09-07 MED ORDER — CEFAZOLIN SODIUM-DEXTROSE 2-4 GM/100ML-% IV SOLN
2.0000 g | INTRAVENOUS | Status: AC
  Administered 2020-09-07: 2 g via INTRAVENOUS
  Filled 2020-09-07: qty 100

## 2020-09-07 MED ORDER — ALUM & MAG HYDROXIDE-SIMETH 200-200-20 MG/5ML PO SUSP: 30.0000 mL | Freq: Four times a day (QID) | ORAL | Status: DC | PRN

## 2020-09-07 MED ORDER — FENTANYL CITRATE (PF) 100 MCG/2ML IJ SOLN
INTRAMUSCULAR | Status: DC | PRN
  Administered 2020-09-07: 50 ug via INTRAVENOUS
  Administered 2020-09-07: 100 ug via INTRAVENOUS

## 2020-09-07 MED ORDER — ORAL CARE MOUTH RINSE: 15.0000 mL | Freq: Once | OROMUCOSAL | Status: AC

## 2020-09-07 MED ORDER — METHYLPHENIDATE HCL 5 MG PO TABS
2.5000 mg | ORAL_TABLET | Freq: Three times a day (TID) | ORAL | Status: DC | PRN
  Filled 2020-09-07: qty 1

## 2020-09-07 MED ORDER — THROMBIN 5000 UNITS EX SOLR: OROMUCOSAL | Status: DC | PRN

## 2020-09-07 MED ORDER — MIDAZOLAM HCL 5 MG/5ML IJ SOLN
INTRAMUSCULAR | Status: DC | PRN
  Administered 2020-09-07: 2 mg via INTRAVENOUS

## 2020-09-07 MED ORDER — PROPOFOL 10 MG/ML IV BOLUS
INTRAVENOUS | Status: DC | PRN
  Administered 2020-09-07: 180 mg via INTRAVENOUS

## 2020-09-07 MED ORDER — PROPOFOL 10 MG/ML IV BOLUS
INTRAVENOUS | Status: AC
  Filled 2020-09-07: qty 20

## 2020-09-07 MED ORDER — EPHEDRINE SULFATE 50 MG/ML IJ SOLN
INTRAMUSCULAR | Status: DC | PRN
  Administered 2020-09-07: 5 mg via INTRAVENOUS

## 2020-09-07 MED ORDER — ACETAMINOPHEN 650 MG RE SUPP: 650.0000 mg | RECTAL | Status: DC | PRN

## 2020-09-07 MED ORDER — PANTOPRAZOLE SODIUM 40 MG PO TBEC
40.0000 mg | DELAYED_RELEASE_TABLET | Freq: Every day | ORAL | Status: DC
  Administered 2020-09-07: 40 mg via ORAL
  Filled 2020-09-07: qty 1

## 2020-09-07 MED ORDER — MENTHOL 3 MG MT LOZG: 1.0000 | LOZENGE | OROMUCOSAL | Status: DC | PRN

## 2020-09-07 MED ORDER — HYDROMORPHONE HCL 1 MG/ML IJ SOLN
0.5000 mg | INTRAMUSCULAR | Status: DC | PRN
Start: 2020-09-07 — End: 2020-09-08
  Administered 2020-09-07: 0.5 mg via INTRAVENOUS
  Filled 2020-09-07: qty 0.5

## 2020-09-07 MED ORDER — 0.9 % SODIUM CHLORIDE (POUR BTL) OPTIME
TOPICAL | Status: DC | PRN
  Administered 2020-09-07: 1000 mL

## 2020-09-07 MED ORDER — MIDAZOLAM HCL 2 MG/2ML IJ SOLN
INTRAMUSCULAR | Status: AC
  Filled 2020-09-07: qty 2

## 2020-09-07 MED ORDER — THROMBIN 5000 UNITS EX SOLR
CUTANEOUS | Status: AC
  Filled 2020-09-07: qty 5000

## 2020-09-07 MED ORDER — DIAZEPAM 2 MG PO TABS: 2.0000 mg | ORAL_TABLET | Freq: Two times a day (BID) | ORAL | Status: DC | PRN

## 2020-09-07 SURGICAL SUPPLY — 59 items
BAND RUBBER #18 3X1/16 STRL (MISCELLANEOUS) ×4 IMPLANT
BASKET BONE COLLECTION (BASKET) ×2 IMPLANT
BENZOIN TINCTURE PRP APPL 2/3 (GAUZE/BANDAGES/DRESSINGS) ×2 IMPLANT
BIT DRILL NEURO 2X3.1 SFT TUCH (MISCELLANEOUS) ×1 IMPLANT
BONE VIVIGEN FORMABLE 1.3CC (Bone Implant) ×4 IMPLANT
BUR MATCHSTICK NEURO 3.0 LAGG (BURR) ×2 IMPLANT
CANISTER SUCT 3000ML PPV (MISCELLANEOUS) ×2 IMPLANT
CARTRIDGE OIL MAESTRO DRILL (MISCELLANEOUS) ×1 IMPLANT
CLSR STERI-STRIP ANTIMIC 1/2X4 (GAUZE/BANDAGES/DRESSINGS) ×2 IMPLANT
DERMABOND ADVANCED (GAUZE/BANDAGES/DRESSINGS)
DERMABOND ADVANCED .7 DNX12 (GAUZE/BANDAGES/DRESSINGS) IMPLANT
DIFFUSER DRILL AIR PNEUMATIC (MISCELLANEOUS) ×2 IMPLANT
DRAIN JACKSON PRT FLT 7MM (DRAIN) ×2 IMPLANT
DRAPE C-ARM 42X72 X-RAY (DRAPES) ×4 IMPLANT
DRAPE LAPAROTOMY 100X72 PEDS (DRAPES) ×2 IMPLANT
DRAPE MICROSCOPE LEICA (MISCELLANEOUS) ×2 IMPLANT
DRILL NEURO 2X3.1 SOFT TOUCH (MISCELLANEOUS) ×2
DRSG OPSITE 4X5.5 SM (GAUZE/BANDAGES/DRESSINGS) ×2 IMPLANT
DRSG OPSITE POSTOP 4X6 (GAUZE/BANDAGES/DRESSINGS) ×2 IMPLANT
DURAPREP 6ML APPLICATOR 50/CS (WOUND CARE) ×2 IMPLANT
ELECT COATED BLADE 2.86 ST (ELECTRODE) ×2 IMPLANT
ELECT REM PT RETURN 9FT ADLT (ELECTROSURGICAL) ×2
ELECTRODE REM PT RTRN 9FT ADLT (ELECTROSURGICAL) ×1 IMPLANT
EVACUATOR SILICONE 100CC (DRAIN) ×2 IMPLANT
GAUZE 4X4 16PLY RFD (DISPOSABLE) IMPLANT
GAUZE SPONGE 4X4 12PLY STRL (GAUZE/BANDAGES/DRESSINGS) ×2 IMPLANT
GAUZE SPONGE 4X4 12PLY STRL LF (GAUZE/BANDAGES/DRESSINGS) ×2 IMPLANT
GLOVE BIO SURGEON STRL SZ7 (GLOVE) ×4 IMPLANT
GLOVE BIO SURGEON STRL SZ8 (GLOVE) ×4 IMPLANT
GLOVE INDICATOR 8.5 STRL (GLOVE) ×4 IMPLANT
GOWN STRL REUS W/ TWL LRG LVL3 (GOWN DISPOSABLE) ×2 IMPLANT
GOWN STRL REUS W/ TWL XL LVL3 (GOWN DISPOSABLE) ×2 IMPLANT
GOWN STRL REUS W/TWL LRG LVL3 (GOWN DISPOSABLE) ×2
GOWN STRL REUS W/TWL XL LVL3 (GOWN DISPOSABLE) ×2
HALTER HD/CHIN CERV TRACTION D (MISCELLANEOUS) ×2 IMPLANT
HEMOSTAT POWDER KIT SURGIFOAM (HEMOSTASIS) ×2 IMPLANT
KIT BASIN OR (CUSTOM PROCEDURE TRAY) ×2 IMPLANT
KIT TURNOVER KIT B (KITS) ×2 IMPLANT
NEEDLE SPNL 20GX3.5 QUINCKE YW (NEEDLE) ×2 IMPLANT
NS IRRIG 1000ML POUR BTL (IV SOLUTION) ×2 IMPLANT
OIL CARTRIDGE MAESTRO DRILL (MISCELLANEOUS) ×2
PACK LAMINECTOMY NEURO (CUSTOM PROCEDURE TRAY) ×2 IMPLANT
PIN DISTRACTION 14MM (PIN) IMPLANT
PLATE ANT CERV XTEND 1 LV 16 (Plate) ×2 IMPLANT
PLATE ANT CERV XTEND 1 LV 18 (Plate) ×2 IMPLANT
SCREW XTD VAR 4.2 SELF TAP 12 (Screw) ×8 IMPLANT
SCREW XTEND SELF DRILL 4.6X12 (Screw) IMPLANT
SCREW XTEND SELF DRILL 4.6X14 (Screw) ×8 IMPLANT
SPACER HEDRON C 12X14X6 0D (Spacer) ×2 IMPLANT
SPACER HEDRON C 12X14X8 0D (Spacer) ×2 IMPLANT
SPONGE INTESTINAL PEANUT (DISPOSABLE) ×2 IMPLANT
SPONGE SURGIFOAM ABS GEL SZ50 (HEMOSTASIS) ×2 IMPLANT
STRIP CLOSURE SKIN 1/2X4 (GAUZE/BANDAGES/DRESSINGS) ×2 IMPLANT
SUT VIC AB 3-0 SH 8-18 (SUTURE) ×2 IMPLANT
SUT VIC AB 4-0 PS2 27 (SUTURE) ×2 IMPLANT
TAPE CLOTH 4X10 WHT NS (GAUZE/BANDAGES/DRESSINGS) ×2 IMPLANT
TOWEL GREEN STERILE (TOWEL DISPOSABLE) ×2 IMPLANT
TOWEL GREEN STERILE FF (TOWEL DISPOSABLE) ×2 IMPLANT
WATER STERILE IRR 1000ML POUR (IV SOLUTION) ×2 IMPLANT

## 2020-09-07 NOTE — H&P (Signed)
Shannon Swanson is an 50 y.o. female.   Chief Complaint: Neck pain bilateral shoulder and arm pain. HPI: 50 year old female sustained a work-related injury is an injured her shoulder and her neck for the last several months and years she has been undergoing conservative treatment with anti-inflammatories epidural steroid injections time and physical therapy.  She is failed all forms conservative treatment she has both intrinsic shoulder pathology as well as disc bulges both above and below her previous cervical fusion at C5-6.  Because of her progression of clinical syndrome imaging findings failed conservative treatment I recommended anterior cervical discectomies and fusion at C4-5 and C6-7.  I have extensively gone over the risks and benefits of that operation with her as well as perioperative course expectations of outcome and alternatives of surgery and she understands and agrees to proceed forward.  Past Medical History:  Diagnosis Date  . Anxiety   . Attention deficit disorder (ADD)   . Complication of anesthesia   . Depression   . Dysrhythmia    PACs- after epidural steroid injection in neck  . Family history of adverse reaction to anesthesia    Mother, PONV and "difficult to wake up"  . GERD (gastroesophageal reflux disease)   . History of kidney stones   . MVP (mitral valve prolapse)    diagnosed at 19. Per notes from Dr. Cathlean Cower in 2011, "no 2D echo evidence"  . Nerve pain    per patient in bilateral hands  . PONV (postoperative nausea and vomiting)   . Sleep apnea     Past Surgical History:  Procedure Laterality Date  . CERVICAL DISCECTOMY  2007   anterior approach 2007 Dr Saintclair Halsted   . DILATION AND CURETTAGE OF UTERUS    . LUMBAR DISC SURGERY    . rotator cuff repair Right 06/2019   partial repair by Dr. Tamera Punt  . TONSILLECTOMY      Family History  Problem Relation Age of Onset  . Emphysema Father   . Diabetes Mother   . Mitral valve prolapse Mother   . Hiatal  hernia Mother   . Diabetes Sister   . Diabetes Maternal Aunt   . Diabetes Maternal Uncle    Social History:  reports that she has never smoked. She has never used smokeless tobacco. She reports that she does not drink alcohol and does not use drugs.  Allergies:  Allergies  Allergen Reactions  . Meperidine Hcl Other (See Comments)    Severe headache  . Lactose Intolerance (Gi)     Upset stomach   . Codeine Nausea Only    Medications Prior to Admission  Medication Sig Dispense Refill  . diclofenac Sodium (VOLTAREN) 1 % GEL Apply 2 g topically 3 (three) times daily as needed for pain.    Marland Kitchen escitalopram (LEXAPRO) 20 MG tablet Take 1 tablet (20 mg total) by mouth at bedtime. (Patient taking differently: Take 20 mg by mouth daily.) 90 tablet 1  . lactase (LACTAID) 3000 UNITS tablet Take 9,000 Units by mouth 3 (three) times daily with meals.    . methylphenidate (RITALIN) 5 MG tablet Take 1 tablet (5 mg total) by mouth 3 (three) times daily. (Patient taking differently: Take 2.5-5 mg by mouth 3 (three) times daily as needed (hyperactivity).) 270 tablet 0  . traMADol (ULTRAM) 50 MG tablet Take 50 mg by mouth every 6 (six) hours as needed for moderate pain.    . diazepam (VALIUM) 2 MG tablet Take 1 tablet (2 mg total)  by mouth 2 (two) times daily as needed for anxiety (Panic attack; flashback). 60 tablet 0    No results found for this or any previous visit (from the past 48 hour(s)). No results found.  Review of Systems  Musculoskeletal: Positive for neck pain.  Neurological: Positive for numbness.    Blood pressure (!) 153/87, pulse 88, temperature (!) 97.3 F (36.3 C), temperature source Oral, resp. rate 18, height 5\' 8"  (1.727 m), weight 78.9 kg, SpO2 100 %. Physical Exam HENT:     Head: Normocephalic.     Nose: Nose normal.     Mouth/Throat:     Mouth: Mucous membranes are moist.  Eyes:     Pupils: Pupils are equal, round, and reactive to light.  Cardiovascular:     Rate and  Rhythm: Normal rate.     Pulses: Normal pulses.  Pulmonary:     Effort: Pulmonary effort is normal.  Abdominal:     General: Abdomen is flat.  Skin:    General: Skin is warm.  Neurological:     General: No focal deficit present.     Mental Status: She is alert.     Comments: Patient is awake alert strength is 5/5 deltoid, bicep, tricep, wrist flexion, wrist extension, hand intrinsics.      Assessment/Plan 50 year old presents for ACDF C4-5 C6-7 with removal of hardware C5-6  Elaina Hoops, MD 09/07/2020, 10:27 AM

## 2020-09-07 NOTE — Anesthesia Preprocedure Evaluation (Addendum)
Anesthesia Evaluation  Patient identified by MRN, date of birth, ID band Patient awake    Reviewed: Allergy & Precautions, NPO status , Patient's Chart, lab work & pertinent test results  History of Anesthesia Complications (+) PONV and history of anesthetic complications  Airway Mallampati: II  TM Distance: >3 FB Neck ROM: Full    Dental no notable dental hx. (+) Dental Advisory Given   Pulmonary sleep apnea ,    Pulmonary exam normal        Cardiovascular Normal cardiovascular exam+ Valvular Problems/Murmurs MVP      Neuro/Psych PSYCHIATRIC DISORDERS Anxiety negative neurological ROS     GI/Hepatic Neg liver ROS, GERD  ,  Endo/Other  negative endocrine ROS  Renal/GU negative Renal ROS     Musculoskeletal RIGHT SHOULDER ROTATOR CUFF PARTIAL TEAR, IMPINGEMENT, ACROMIOCLAVICULAR DEGENERATIVE JOINT DISEASE   Abdominal   Peds  (+) ATTENTION DEFICIT DISORDER WITHOUT HYPERACTIVITY Hematology negative hematology ROS (+)   Anesthesia Other Findings Day of surgery medications reviewed with the patient.  Reproductive/Obstetrics                            Anesthesia Physical  Anesthesia Plan  ASA: II  Anesthesia Plan: General   Post-op Pain Management:    Induction: Intravenous  PONV Risk Score and Plan: 4 or greater and Scopolamine patch - Pre-op, Midazolam, Dexamethasone, Ondansetron and Propofol infusion  Airway Management Planned: Oral ETT  Additional Equipment:   Intra-op Plan:   Post-operative Plan: Extubation in OR  Informed Consent: I have reviewed the patients History and Physical, chart, labs and discussed the procedure including the risks, benefits and alternatives for the proposed anesthesia with the patient or authorized representative who has indicated his/her understanding and acceptance.     Dental advisory given  Plan Discussed with: Anesthesiologist and  CRNA  Anesthesia Plan Comments:        Anesthesia Quick Evaluation

## 2020-09-07 NOTE — Op Note (Signed)
Preoperative diagnosis: Cervical spinal stenosis cervical spondylitic radiculopathy C5 and C7 from disc degeneration with disc bulge at C4-5 as well as 6 C6-7.  Postop diagnosis: Same  Procedure: Anterior cervical discectomies and fusion at C4-5 and C6-7 with removal of hardware at C5-6 utilizing globus titanium cages packed with locally harvested autograft mixed with division at C4-5 and C6-7 and anterior cervical plating with 2 separate 1 level plates one at H2-9 and one at C6-7 utilizing the globus extend plating system  Surgeon: Dominica Severin Danyiel Crespin  Assistant: Nash Shearer  Anesthesia: General  EBL: Minimal  HPI: 50 year old female sustained a work-related injury and has been plagued with severe neck pain right greater than left arm pain rating down both C5 and C7 nerve root patterns.  Work-up revealed disc bulges kyphosis spinal stenosis and foraminal stenosis at C4-5 and C6-7 with a solid fusion at C5-6 and due to her progression of clinical syndrome imaging findings of a conservative treatment I recommended anterior cervical discectomies and fusion at those 2 levels.  I extensively reviewed the risks and benefits of that operation as well as removal of hardware perioperative course expectations of outcome and alternatives of surgery and she understands and agrees to proceed forward.  Operative procedure patient brought into the OR was due to general anesthesia positioned supine the neck in slight extension 5 pounds or halter traction the right 7 neck was prepped and draped in routine sterile fashion her old incision was identified and under preoperative x-ray confirming that to be a good entry point I extended a little bit medial laterally dissected through the scar tissue and dissected out the superficial of the platysma divided the avascular plane between the sternomastoid and strap muscles down to the prevertebral fascia which was dissected away with Kitners.  I immediately identified the old plate  so identified the disc base is above and below reflected longus Coley laterally and placed this self-retaining retractor first at C4-5.  This disc was noted markedly disrupted I incised it removed anterior osteophyte scraped the endplates removing extensive minor degenerative disc then under microscopic nation under biting of both endplates I removed large fragments of disc from the C5 foramen on the right as well as it was marked uncinate hypertrophy bilaterally I remove this I skeletonized and decompressed both C5 nerve roots flush with the pedicle.  There was also an extensive central osteophyte this was all aggressively under Bitton of both C4 and C5 vertebral bodies and I sized up a 6 mm parallel cage packed with locally harvested autograft mixed and inserted it while drilling down the disc base I captured all bone shavings and mucus trap.  Then the retractor was repositioned for C6-7 in similar fashion there was a complete disrupted disc large anterior osteophytes that were removed disc base was drilled down capturing the bone shavings and mucus trap under microscopic lamination.  Pathology here was mostly spondylitic there was a central disc bulge this was aggressively under Bitton and removed both C7 nerve roots were identified and decompressed flush the pedicle.  Then on this was much larger disc base I selected an 8 mm implant packed with the locally harvested autograft mixed and inserted.  Then I selected a 16 mm plate for J2-4 and an 18 mm plate for Q6-8 all screws had excellent purchase I did use 2 rescue screws that I ended up replacing went from a 12 mm to a 14 mm to kayak get good purchase at C4-5 and to use 2 rescue screws  C6-7 as well.  Prior to plate placement I packed additional bone graft material laterally to the cage.  Then placed a JP drain and wound was closed in layers after meticulous hemostasis and irrigation was carried out.  Autism was closed with 3-0 interrupted Vicryl skin was closed  running 4 subcuticular Dermabond benzoin Steri-Strips and a sterile dressing was applied patient recovery in stable condition.  At the end the case all needle count sponge counts were correct.

## 2020-09-07 NOTE — Transfer of Care (Signed)
Immediate Anesthesia Transfer of Care Note  Patient: Shannon Swanson  Procedure(s) Performed: Anterior Cervical Decompression Fusion - Cervical four-Cervical five - Cervical six-Cervical seven with removal Cervical five-six (N/A Spine Cervical)  Patient Location: PACU  Anesthesia Type:General  Level of Consciousness: drowsy and patient cooperative  Airway & Oxygen Therapy: Patient Spontanous Breathing and Patient connected to face mask oxygen  Post-op Assessment: Report given to RN and Post -op Vital signs reviewed and stable  Post vital signs: Reviewed and stable  Last Vitals:  Vitals Value Taken Time  BP 129/76 09/07/20 1347  Temp    Pulse 91 09/07/20 1352  Resp 14 09/07/20 1352  SpO2 99 % 09/07/20 1352  Vitals shown include unvalidated device data.  Last Pain:  Vitals:   09/07/20 0951  TempSrc:   PainSc: 5       Patients Stated Pain Goal: 3 (69/67/89 3810)  Complications: No complications documented.

## 2020-09-07 NOTE — Anesthesia Procedure Notes (Signed)
Procedure Name: Intubation Date/Time: 09/07/2020 11:20 AM Performed by: Georgia Duff, CRNA Pre-anesthesia Checklist: Patient identified, Emergency Drugs available, Suction available and Patient being monitored Patient Re-evaluated:Patient Re-evaluated prior to induction Oxygen Delivery Method: Circle System Utilized Preoxygenation: Pre-oxygenation with 100% oxygen Induction Type: IV induction Ventilation: Mask ventilation without difficulty Laryngoscope Size: Glidescope and 3 Grade View: Grade I Tube type: Oral Tube size: 7.0 mm Number of attempts: 1 Airway Equipment and Method: Stylet and Oral airway Placement Confirmation: ETT inserted through vocal cords under direct vision,  positive ETCO2 and breath sounds checked- equal and bilateral Secured at: 21 cm Tube secured with: Tape Dental Injury: Teeth and Oropharynx as per pre-operative assessment  Comments: Attempt with miller 2, small mouth opening(much smaller than patient could demonstrate in short stay), grade 3 view.  Swapped to glide.

## 2020-09-07 NOTE — Progress Notes (Signed)
Orthopedic Tech Progress Note Patient Details:  Shannon Swanson 11-Jan-1971 [id number] PACU RN called requesting a SOFT COLLAR Ortho Devices Type of Ortho Device: Soft collar Ortho Device/Splint Location: NECK Ortho Device/Splint Interventions: Application,Adjustment   Post Interventions Patient Tolerated: Well Instructions Provided: Care of device   Janit Pagan 09/07/2020, 3:39 PM

## 2020-09-08 ENCOUNTER — Other Ambulatory Visit (HOSPITAL_COMMUNITY): Payer: Self-pay | Admitting: Student

## 2020-09-08 DIAGNOSIS — M50121 Cervical disc disorder at C4-C5 level with radiculopathy: Secondary | ICD-10-CM | POA: Diagnosis not present

## 2020-09-08 MED ORDER — METHOCARBAMOL 500 MG PO TABS: 500.0000 mg | ORAL_TABLET | Freq: Four times a day (QID) | ORAL | 0 refills | Status: DC

## 2020-09-08 MED ORDER — HYDROCODONE-ACETAMINOPHEN 5-325 MG PO TABS: 1.0000 | ORAL_TABLET | ORAL | 0 refills | Status: DC | PRN

## 2020-09-08 MED FILL — METHOCARBAMOL 500 MG TABS: 500 | 11 days supply | Qty: 45 | Fill #0

## 2020-09-08 MED FILL — HYDROCODON-APAP 5-325: 5-325 | 3 days supply | Qty: 20 | Fill #0

## 2020-09-08 MED FILL — ONDANSETRON HCL 4 MG TABS: 4 | 5 days supply | Qty: 20 | Fill #0

## 2020-09-08 NOTE — Progress Notes (Signed)
Pt and family given D/C instructions with verbal understanding. Rx's were sent to the pharmacy by MD. Pt's incision is clean and dry with no sign of infection. Pt's IV and JP drain were removed prior to D/C. Pt D/C'd home via wheelchair per MD order. Pt is stable @ D/C and has no other needs at this time. Holli Humbles, RN

## 2020-09-08 NOTE — Anesthesia Postprocedure Evaluation (Signed)
Anesthesia Post Note  Patient: Shannon Swanson  Procedure(s) Performed: Anterior Cervical Decompression Fusion - Cervical four-Cervical five - Cervical six-Cervical seven with removal Cervical five-six (N/A Spine Cervical)     Patient location during evaluation: PACU Anesthesia Type: General Level of consciousness: awake and alert Pain management: pain level controlled Vital Signs Assessment: post-procedure vital signs reviewed and stable Respiratory status: spontaneous breathing, nonlabored ventilation, respiratory function stable and patient connected to nasal cannula oxygen Cardiovascular status: blood pressure returned to baseline and stable Postop Assessment: no apparent nausea or vomiting Anesthetic complications: no   No complications documented.  Last Vitals:  Vitals:   09/08/20 0553 09/08/20 0723  BP: 129/80 118/76  Pulse: 85 81  Resp: 18 16  Temp: 36.5 C 36.6 C  SpO2: 94% 94%    Last Pain:  Vitals:   09/08/20 1030  TempSrc:   PainSc: 3                  Takesha Steger

## 2020-09-08 NOTE — Progress Notes (Signed)
Orthopedic Tech Progress Note Patient Details:  Shannon Swanson 1971-01-03 [id number] RN called requesting another SOFT COLLAR Ortho Devices Type of Ortho Device: Soft collar Ortho Device/Splint Location: neck Ortho Device/Splint Interventions: Other (comment)   Post Interventions Patient Tolerated: Other (comment) Instructions Provided: Other (comment)   Janit Pagan 09/08/2020, 1:08 PM

## 2020-09-08 NOTE — Discharge Summary (Signed)
Physician Discharge Summary  Patient ID: Shannon Swanson MRN: [id number] DOB/AGE: [birth year] 50 y.o.  Admit date: 09/07/2020 Discharge date: 09/08/2020  Admission Diagnoses: Cervical spinal stenosis cervical spondylitic radiculopathy C5 and C7 from disc degeneration with disc bulge at C4-5 as well as 6 C6-7.    Discharge Diagnoses: same   Discharged Condition: good  Hospital Course: The patient was admitted on 09/07/2020 and taken to the operating room where the patient underwent acdf C4-5, 6-7. The patient tolerated the procedure well and was taken to the recovery room and then to the floor in stable condition. The hospital course was routine. There were no complications. The wound remained clean dry and intact. Pt had appropriate neck soreness. No complaints of arm pain or new N/T/W. The patient remained afebrile with stable vital signs, and tolerated a regular diet. The patient continued to increase activities, and pain was well controlled with oral pain medications.   Consults: None  Significant Diagnostic Studies:  Results for orders placed or performed during the hospital encounter of 09/02/20  Surgical pcr screen   Specimen: Nasal Mucosa; Nasal Swab  Result Value Ref Range   MRSA, PCR NEGATIVE NEGATIVE   Staphylococcus aureus NEGATIVE NEGATIVE  CBC  Result Value Ref Range   WBC 8.4 4.0 - 10.5 K/uL   RBC 4.41 3.87 - 5.11 MIL/uL   Hemoglobin 13.2 12.0 - 15.0 g/dL   HCT 40.6 36.0 - 46.0 %   MCV 92.1 80.0 - 100.0 fL   MCH 29.9 26.0 - 34.0 pg   MCHC 32.5 30.0 - 36.0 g/dL   RDW 12.3 11.5 - 15.5 %   Platelets 293 150 - 400 K/uL   nRBC 0.0 0.0 - 0.2 %  Basic metabolic panel  Result Value Ref Range   Sodium 141 135 - 145 mmol/L   Potassium 4.0 3.5 - 5.1 mmol/L   Chloride 105 98 - 111 mmol/L   CO2 26 22 - 32 mmol/L   Glucose, Bld 97 70 - 99 mg/dL   BUN 10 6 - 20 mg/dL   Creatinine, Ser 0.88 0.44 - 1.00 mg/dL   Calcium 9.2 8.9 - 10.3 mg/dL   GFR, Estimated >60 >60 mL/min    Anion gap 10 5 - 15  Type and screen [place]  Result Value Ref Range   ABO/RH(D) A POS    Antibody Screen NEG    Sample Expiration 09/16/2020,2359    Extend sample reason      NO TRANSFUSIONS OR PREGNANCY IN THE PAST 3 MONTHS Performed at [facility], [address], [place], [place] [address]     DG Cervical Spine 1 View  Result Date: 09/07/2020 CLINICAL DATA:  C4-C5 and C6-C7 ACDF.  C5-C6 hardware removal. EXAM: DG C-ARM 1-60 MIN; DG CERVICAL SPINE - 1 VIEW FLUOROSCOPY TIME:  Fluoroscopy Time:  8 seconds Radiation Exposure Index (if provided by the fluoroscopic device): 1.28 mGy. COMPARISON:  MRI of the cervical spine 06/27/2020. FINDINGS: Two C-arm fluoroscopic images were obtained intraoperatively and submitted for post operative interpretation. The first image demonstrate ACDF at C4-C5 with intervening interbody cage. The second image demonstrates ACDF fat C6-C7 with intervening interbody cage. Please see the performing provider's procedural report for further detail. IMPRESSION: Intraoperative fluoroscopy, as detailed above. Electronically Signed   By: Margaretha Sheffield MD   On: 09/07/2020 14:18   DG C-Arm 1-60 Min  Result Date: 09/07/2020 CLINICAL DATA:  C4-C5 and C6-C7 ACDF.  C5-C6 hardware removal. EXAM: DG C-ARM  1-60 MIN; DG CERVICAL SPINE - 1 VIEW FLUOROSCOPY TIME:  Fluoroscopy Time:  8 seconds Radiation Exposure Index (if provided by the fluoroscopic device): 1.28 mGy. COMPARISON:  MRI of the cervical spine 06/27/2020. FINDINGS: Two C-arm fluoroscopic images were obtained intraoperatively and submitted for post operative interpretation. The first image demonstrate ACDF at C4-C5 with intervening interbody cage. The second image demonstrates ACDF fat C6-C7 with intervening interbody cage. Please see the performing provider's procedural report for further detail. IMPRESSION: Intraoperative fluoroscopy, as detailed above. Electronically Signed   By: Margaretha Sheffield MD   On: 09/07/2020 14:18    Antibiotics:  Anti-infectives (From admission, onward)   Start     Dose/Rate Route Frequency Ordered Stop   09/07/20 2000  ceFAZolin (ANCEF) IVPB 2g/100 mL premix        2 g 200 mL/hr over 30 Minutes Intravenous Every 8 hours 09/07/20 1556 09/09/20 1959   09/07/20 1045  ceFAZolin (ANCEF) IVPB 2g/100 mL premix        2 g 200 mL/hr over 30 Minutes Intravenous On call to O.R. 09/07/20 0947 09/07/20 1131      Discharge Exam: Blood pressure 118/76, pulse 81, temperature 97.8 F (36.6 C), temperature source Oral, resp. rate 16, height 5\' 8"  (1.727 m), weight 78.9 kg, SpO2 94 %. Neurologic: Grossly normal ambualting and voiding well, incision cdi  Discharge Medications:   Allergies as of 09/08/2020      Reactions   Meperidine Hcl Other (See Comments)   Severe headache   Lactose Intolerance (gi)    Upset stomach    Codeine Nausea Only      Medication List    TAKE these medications   diazepam 2 MG tablet Commonly known as: VALIUM Take 1 tablet (2 mg total) by mouth 2 (two) times daily as needed for anxiety (Panic attack; flashback).   diclofenac Sodium 1 % Gel Commonly known as: VOLTAREN Apply 2 g topically 3 (three) times daily as needed for pain.   escitalopram 20 MG tablet Commonly known as: LEXAPRO Take 1 tablet (20 mg total) by mouth at bedtime. What changed: when to take this   HYDROcodone-acetaminophen 5-325 MG tablet Commonly known as: NORCO/VICODIN Take 1 tablet by mouth every 4 (four) hours as needed for moderate pain.   lactase 3000 units tablet Commonly known as: LACTAID Take 9,000 Units by mouth 3 (three) times daily with meals.   methocarbamol 500 MG tablet Commonly known as: Robaxin Take 1 tablet (500 mg total) by mouth 4 (four) times daily.   methylphenidate 5 MG tablet Commonly known as: RITALIN Take 1 tablet (5 mg total) by mouth 3 (three) times daily. What changed:   how much to take  when to take  this  reasons to take this   traMADol 50 MG tablet Commonly known as: ULTRAM Take 50 mg by mouth every 6 (six) hours as needed for moderate pain.       Disposition: home   Final Dx: acdf c4-5, 6-7  Discharge Instructions     Remove dressing in 72 hours   Complete by: As directed    Call MD for:  difficulty breathing, headache or visual disturbances   Complete by: As directed    Call MD for:  hives   Complete by: As directed    Call MD for:  persistant nausea and vomiting   Complete by: As directed    Call MD for:  redness, tenderness, or signs of infection (pain, swelling, redness, odor or green/yellow  discharge around incision site)   Complete by: As directed    Call MD for:  severe uncontrolled pain   Complete by: As directed    Call MD for:  temperature >100.4   Complete by: As directed    Diet - low sodium heart healthy   Complete by: As directed    Driving Restrictions   Complete by: As directed    No driving for 2 weeks, no riding in the car for 1 week   Increase activity slowly   Complete by: As directed    Lifting restrictions   Complete by: As directed    No lifting more than 8 lbs         Signed: Ocie Cornfield Estel Scholze 09/08/2020, 7:55 AM

## 2020-09-08 NOTE — TOC Progression Note (Addendum)
Transition of Care [facility]) - Progression Note    Patient Details  Name: Shannon Swanson MRN: [id number] Date of Birth: November 01, 1970  Transition [facility]) CM/SW [place], RN Phone Number: [phone]  09/08/2020, 10:22 AM  Clinical Narrative:    CM received message from nurse stating that patient is in need of shower chair and that she is workers comp. CM spoke with Sydnee Levans case manager for patient at workers comp [phone]) Order for shower chair  faxed to [phone] and DME will be shipped to the patient. Nurse has been made aware.         Expected Discharge Plan and Services           Expected Discharge Date: 09/08/20                                     Social Determinants of Health (SDOH) Interventions    Readmission Risk Interventions No flowsheet data found.

## 2020-09-08 NOTE — Evaluation (Signed)
Physical Therapy Evaluation and Discharge Patient Details Name: Shannon Swanson MRN: [id number] DOB: 1971/02/22 Today's Date: 09/08/2020   History of Present Illness  50 yo female s/p ACDF C4-5 and C6-7. PMH including Anxiety, ADD, depression, MVP, and cervical sx (2007).  Clinical Impression  Patient evaluated by Physical Therapy with no further acute PT needs identified. All education has been completed and the patient has no further questions. Pt was able to demonstrate transfers and ambulation with gross modified independence to supervision for safety and no AD. Pt was educated on precautions, brace application/wearing schedule, appropriate activity progression, and car transfer. See below for any follow-up Physical Therapy or equipment needs. PT is signing off. Thank you for this referral.     Follow Up Recommendations No PT follow up;Supervision for mobility/OOB    Equipment Recommendations  None recommended by PT    Recommendations for Other Services       Precautions / Restrictions Precautions Precautions: Cervical Precaution Booklet Issued: Yes (comment) Precaution Comments: Provided handout and education on cervical precautions and compensatory techniques Required Braces or Orthoses: Cervical Brace Cervical Brace: Soft collar;At all times Restrictions Weight Bearing Restrictions: No      Mobility  Bed Mobility Overal bed mobility: Needs Assistance Bed Mobility: Rolling;Sidelying to Sit;Sit to Sidelying Rolling: Supervision Sidelying to sit: Supervision     Sit to sidelying: Supervision General bed mobility comments: Pt was received sitting up EOB.    Transfers Overall transfer level: Modified independent Equipment used: None Transfers: Sit to/from Stand Sit to Stand: Supervision         General transfer comment: Guarded but pt was able to complete without difficulty.  Ambulation/Gait Ambulation/Gait assistance: Supervision Gait Distance (Feet): 250  Feet Assistive device: None Gait Pattern/deviations: Step-through pattern;Decreased stride length;Trunk flexed Gait velocity: Decreased Gait velocity interpretation: <1.31 ft/sec, indicative of household ambulator General Gait Details: VC's for improved posture. Pt guarded and with mild unsteadiness noted. No overt LOB, however.  Stairs Stairs: Yes Stairs assistance: Supervision Stair Management: One rail Left;Step to pattern;Forwards;Sideways Number of Stairs: 10 General stair comments: Forwards to asce3nd, and sideways to descend as pt with R rotator cuff injury and is concerned with holding onto the railing with the RUE.  Wheelchair Mobility    Modified Rankin (Stroke Patients Only)       Balance Overall balance assessment: Mild deficits observed, not formally tested                                           Pertinent Vitals/Pain Pain Assessment: Faces Faces Pain Scale: Hurts little more Pain Location: Neck and RUE Pain Descriptors / Indicators: Constant;Discomfort;Grimacing Pain Intervention(s): Limited activity within patient's tolerance;Monitored during session;Repositioned    Home Living Family/patient expects to be discharged to:: Private residence Living Arrangements: Children Available Help at Discharge: Family;Available 24 hours/day Type of Home: House Home Access: Stairs to enter Entrance Stairs-Rails: Left Entrance Stairs-Number of Steps: 3 Home Layout: Two level;Able to live on main level with bedroom/bathroom Home Equipment: None (High bed)      Prior Function Level of Independence: Independent         Comments: Initial injury in 2019. Has had shoulder sx. 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 Performing BADLs and IADLs with increased time and effort.     Hand Dominance   Dominant Hand: Right  Extremity/Trunk Assessment   Upper Extremity Assessment Upper Extremity Assessment:  Defer to OT evaluation RUE Deficits / Details: Limited AROM and PROM. AROM 0-50* and PROM 0-90*. Also with ROM restrictions in preparation for second sx; no ROM pasted 90*. Decreased grasp strength RUE: Unable to fully assess due to pain RUE Coordination: decreased fine motor;decreased gross motor    Lower Extremity Assessment Lower Extremity Assessment: Generalized weakness    Cervical / Trunk Assessment Cervical / Trunk Assessment: Other exceptions Cervical / Trunk Exceptions: S/p ACDF  Communication   Communication: No difficulties  Cognition Arousal/Alertness: Awake/alert Behavior During Therapy: WFL for tasks assessed/performed Overall Cognitive Status: Within Functional Limits for tasks assessed                                        General Comments General comments (skin integrity, edema, etc.): Pt's two daughters present during session    Exercises     Assessment/Plan    PT Assessment Patent does not need any further PT services  PT Problem List         PT Treatment Interventions      PT Goals (Current goals can be found in the Care Plan section)  Acute Rehab PT Goals Patient Stated Goal: Go home PT Goal Formulation: All assessment and education complete, DC therapy    Frequency     Barriers to discharge        Co-evaluation               AM-PAC PT "6 Clicks" Mobility  Outcome Measure Help needed turning from your back to your side while in a flat bed without using bedrails?: None Help needed moving from lying on your back to sitting on the side of a flat bed without using bedrails?: None Help needed moving to and from a bed to a chair (including a wheelchair)?: None Help needed standing up from a chair using your arms (e.g., wheelchair or bedside chair)?: None Help needed to walk in hospital room?: None Help needed climbing 3-5 steps with a railing? : None 6 Click Score: 24    End of Session Equipment Utilized During  Treatment: Gait belt;Cervical collar Activity Tolerance: Patient tolerated treatment well Patient left: in bed;with call bell/phone within reach;with family/visitor present Nurse Communication: Mobility status PT Visit Diagnosis: Unsteadiness on feet (R26.81);Pain Pain - part of body:  (neck)    Time: 1610-9604 PT Time Calculation (min) (ACUTE ONLY): 21 min   Charges:   PT Evaluation $PT Eval Low Complexity: 1 Low          Shannon Swanson, PT, DPT Acute Rehabilitation Services Pager: [phone] Office: [phone]   Thelma Comp 09/08/2020, 11:49 AM

## 2020-09-08 NOTE — Evaluation (Addendum)
Occupational Therapy Evaluation Patient Details Name: Shannon Swanson MRN: [id number] DOB: 05-01-1971 Today's Date: 09/08/2020    History of Present Illness 50 yo female s/p ACDF C4-7. PMH including Anxiety, ADD, depression, MVP, and cervical sx (2007).   Clinical Impression   PTA, pt was living with her daughters and was independent working as an Therapist, sports; has required increased time and effort with ADLs/IADLs since original injury in 2019. Currently, pt requires Supervision for ADLs and functional mobility. Provided education and handout on cervical precautions, collar management, bed mobility, grooming, UB ADLs, LB ADLs, toileting, and tub transfer with shower seat; pt demonstrated understanding. Answered all pt questions. Recommend dc home once medically stable per physician. All acute OT needs met and will sign off. Thank you.    Follow Up Recommendations  No OT follow up ; OP therapy once cleared by MD   Equipment Recommendations  Tub/shower seat    Recommendations for Other Services PT consult     Precautions / Restrictions Precautions Precautions: Cervical Precaution Booklet Issued: Yes (comment) Precaution Comments: Provided handout and education on cervical precautions and compensatory techniques Required Braces or Orthoses: Cervical Brace Cervical Brace: Soft collar;At all times Restrictions Weight Bearing Restrictions: No      Mobility Bed Mobility Overal bed mobility: Needs Assistance Bed Mobility: Rolling;Sidelying to Sit;Sit to Sidelying Rolling: Supervision Sidelying to sit: Supervision     Sit to sidelying: Supervision General bed mobility comments: Supervison for safety and cues for sequencing    Transfers Overall transfer level: Needs assistance Equipment used: None Transfers: Sit to/from Stand Sit to Stand: Supervision         General transfer comment: Supervision for safety. Cues on hand positioning, technique and weight shift    Balance Overall  balance assessment: Mild deficits observed, not formally tested                                         ADL either performed or assessed with clinical judgement   ADL Overall ADL's : Needs assistance/impaired                                       General ADL Comments: Pt performing ADLs and functional mobility at Supervision level. Providing education on cervical precautions, collar management, UB ADLs, LB ADLs, bed mobility, toileting, and tub transfer.     Vision Baseline Vision/History: Wears glasses Wears Glasses: At all times Patient Visual Report: No change from baseline       Perception     Praxis      Pertinent Vitals/Pain Pain Assessment: Faces Faces Pain Scale: Hurts little more Pain Location: Neck and RUE Pain Descriptors / Indicators: Constant;Discomfort;Grimacing Pain Intervention(s): Monitored during session;Limited activity within patient's tolerance;Repositioned     Hand Dominance Right   Extremity/Trunk Assessment Upper Extremity Assessment Upper Extremity Assessment: RUE deficits/detail RUE Deficits / Details: Limited AROM and PROM. AROM 0-50* and PROM 0-90*. Also with ROM restrictions in preparation for second sx; no ROM pasted 90*. Decreased grasp strength RUE: Unable to fully assess due to pain RUE Coordination: decreased fine motor;decreased gross motor   Lower Extremity Assessment Lower Extremity Assessment: Defer to PT evaluation   Cervical / Trunk Assessment Cervical / Trunk Assessment: Other exceptions Cervical / Trunk Exceptions: S/p ACDF   Communication Communication Communication: No difficulties  Cognition Arousal/Alertness: Awake/alert Behavior During Therapy: WFL for tasks assessed/performed Overall Cognitive Status: Within Functional Limits for tasks assessed                                     General Comments  Pt's two daughters present during session    Exercises      Shoulder Instructions      Home Living Family/patient expects to be discharged to:: Private residence Living Arrangements: Children Available Help at Discharge: Family;Available 24 hours/day Type of Home: House             Bathroom Shower/Tub: Walk-in shower;Tub/shower unit   Bathroom Toilet: Standard     Home Equipment: None (High bed)          Prior Functioning/Environment Level of Independence: Independent        Comments: Initial injury in 2019. Has had shoulder sx. 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 Performing BADLs and IADLs with increased time and effort.        OT Problem List: Decreased strength;Decreased range of motion;Decreased activity tolerance;Impaired balance (sitting and/or standing);Decreased safety awareness;Decreased cognition;Decreased knowledge of use of DME or AE;Decreased knowledge of precautions;Pain      OT Treatment/Interventions:      OT Goals(Current goals can be found in the care plan section) Acute Rehab OT Goals Patient Stated Goal: Go home OT Goal Formulation: All assessment and education complete, DC therapy  OT Frequency:     Barriers to D/C:            Co-evaluation              AM-PAC OT "6 Clicks" Daily Activity     Outcome Measure Help from another person eating meals?: None Help from another person taking care of personal grooming?: None Help from another person toileting, which includes using toliet, bedpan, or urinal?: None Help from another person bathing (including washing, rinsing, drying)?: None Help from another person to put on and taking off regular upper body clothing?: None Help from another person to put on and taking off regular lower body clothing?: None 6 Click Score: 24   End of Session Equipment Utilized During Treatment: Cervical collar Nurse Communication: Mobility status;Other (comment) (Needs extra collar)  Activity Tolerance: Patient  tolerated treatment well Patient left: in bed;with call bell/phone within reach;with family/visitor present (EOB to eat breakfast)  OT Visit Diagnosis: Unsteadiness on feet (R26.81);Other abnormalities of gait and mobility (R26.89);Muscle weakness (generalized) (M62.81);Pain                Time: 2355-7322 OT Time Calculation (min): 34 min Charges:  OT General Charges $OT Visit: 1 Visit OT Evaluation $OT Eval Low Complexity: 1 Low OT Treatments $Self Care/Home Management : 8-22 mins  Medrith Veillon MSOT, OTR/L Acute Rehab Pager: [phone] Office: [place] 09/08/2020, 9:28 AM

## 2020-09-09 MED FILL — Thrombin For Soln 5000 Unit: CUTANEOUS | Qty: 5000 | Status: AC

## 2020-09-10 ENCOUNTER — Encounter (HOSPITAL_COMMUNITY): Payer: Self-pay | Admitting: Neurosurgery

## 2020-09-14 ENCOUNTER — Ambulatory Visit: Payer: No Typology Code available for payment source | Admitting: Psychiatry

## 2020-09-17 ENCOUNTER — Other Ambulatory Visit: Payer: Self-pay | Admitting: Neurosurgery

## 2020-09-17 ENCOUNTER — Ambulatory Visit (HOSPITAL_COMMUNITY)
Admission: RE | Admit: 2020-09-17 | Payer: No Typology Code available for payment source | Source: Ambulatory Visit | Admitting: Neurosurgery

## 2020-09-17 DIAGNOSIS — I82622 Acute embolism and thrombosis of deep veins of left upper extremity: Secondary | ICD-10-CM

## 2020-09-17 DIAGNOSIS — I82409 Acute embolism and thrombosis of unspecified deep veins of unspecified lower extremity: Secondary | ICD-10-CM | POA: Insufficient documentation

## 2020-09-18 ENCOUNTER — Other Ambulatory Visit (HOSPITAL_COMMUNITY): Payer: Self-pay | Admitting: Neurosurgery

## 2020-09-18 ENCOUNTER — Other Ambulatory Visit (HOSPITAL_COMMUNITY): Payer: Self-pay | Admitting: Pediatrics

## 2020-09-18 ENCOUNTER — Other Ambulatory Visit: Payer: Self-pay

## 2020-09-18 ENCOUNTER — Ambulatory Visit (HOSPITAL_COMMUNITY)
Admission: RE | Admit: 2020-09-18 | Discharge: 2020-09-18 | Disposition: A | Discharge status: Home / Self Care | Payer: No Typology Code available for payment source | Source: Ambulatory Visit | Attending: Neurosurgery | Admitting: Neurosurgery

## 2020-09-18 DIAGNOSIS — M79602 Pain in left arm: Secondary | ICD-10-CM | POA: Insufficient documentation

## 2020-09-18 DIAGNOSIS — M7989 Other specified soft tissue disorders: Secondary | ICD-10-CM | POA: Diagnosis not present

## 2020-09-18 MED FILL — CEPHALEXIN 250 MG/5ML SUSR: 250 | 7 days supply | Qty: 300 | Fill #0

## 2020-09-18 MED FILL — oxyCODONE HCL 5 MG TABS: 5 | 10 days supply | Qty: 40 | Fill #0

## 2020-09-18 NOTE — Progress Notes (Signed)
Left upper extremity venous study completed.     Unable to reach physician   Please see CV Proc for preliminary results.   Vonzell Schlatter, RVT

## 2020-09-22 ENCOUNTER — Other Ambulatory Visit (HOSPITAL_COMMUNITY): Payer: Self-pay | Admitting: Neurosurgery

## 2020-10-27 ENCOUNTER — Encounter: Payer: Self-pay | Admitting: Psychiatry

## 2020-10-27 ENCOUNTER — Other Ambulatory Visit (HOSPITAL_COMMUNITY): Payer: Self-pay

## 2020-10-27 ENCOUNTER — Ambulatory Visit (INDEPENDENT_AMBULATORY_CARE_PROVIDER_SITE_OTHER): Payer: No Typology Code available for payment source | Admitting: Psychiatry

## 2020-10-27 ENCOUNTER — Other Ambulatory Visit: Payer: Self-pay

## 2020-10-27 VITALS — BP 118/76 | HR 91

## 2020-10-27 DIAGNOSIS — F4310 Post-traumatic stress disorder, unspecified: Secondary | ICD-10-CM | POA: Diagnosis not present

## 2020-10-27 DIAGNOSIS — F900 Attention-deficit hyperactivity disorder, predominantly inattentive type: Secondary | ICD-10-CM

## 2020-10-27 MED ORDER — ESCITALOPRAM OXALATE 5 MG/5ML PO SOLN
20.0000 mg | Freq: Every day | ORAL | 5 refills | Status: DC
  Filled 2020-10-27: qty 240, 12d supply, fill #0
  Filled 2020-11-19: qty 240, 12d supply, fill #1

## 2020-10-27 MED ORDER — METHYLPHENIDATE HCL 5 MG PO TABS
5.0000 mg | ORAL_TABLET | Freq: Three times a day (TID) | ORAL | 0 refills | Status: DC
  Filled 2020-10-27: qty 270, 90d supply, fill #0

## 2020-10-27 NOTE — Progress Notes (Signed)
Shannon Swanson [id number] [birth year] 50 y.o.  Subjective:   Patient ID:  Shannon Swanson is a 50 y.o. (DOB [birth year]) female.  Chief Complaint:  Chief Complaint  Patient presents with  . Anxiety    HPI Shannon Swanson presents to the office today for follow-up of anxiety and ADHD. Pt previously seen by Dr. Creig Hines and care is being transferred to this provider due to Dr. Creig Hines' retirement.   She reports that she has had a "pretty rough time" since last visit. She had surgery and was taken out of work in late February. She had a blood clot after surgery. She reports that one of the screws that were put in her neck was starting to come loose by first follow-up apt. She has been unable to drive.   She is out of work currently. 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 She reports some anxiety about her work situation and when she will be able to return to work and return to full duty. She reports that she had shoulder surgery and will need another shoulder surgery. She reports occasional triggers related to her injury that will cause her to "freeze." She reports some intrusive memories and  re-experiencing on rare occasions. Does not recall nightmares. She reports that she has exaggerated startle response. She reports hyper-vigilance. She has some worry about getting well. She reports muscle tension and is not sure if anxiety contributes to this. Denies any panic attacks.   She reports that her mood is "good" considering circumstances. She reports that her sleep has been poor, mostly due to pain and difficulty getting comfortable. Has some difficulty with both sleep initiation and maintenance. She reports that she will toss and turn, sleep for an hour, then toss and turn more. She reports that she has not slept well since before her shoulder surgery. She reports that her eating schedule varies and she has gained weight. She reports that if she has plans with others or church, she  is typically excited about going. She reports that when she foes not have plans she feels more tired. She reports that at times she has to stay home due to pain. She reports difficulty with concentration. Se reports that she had increased difficulty with concentration after surgery and has had headaches. She reports that headaches and concentration have improved some in the last week. Denies SI.   She reports that she is occasionally missing doses of Lexapro. She reports that she has had difficulty swallowing since her surgery and sometimes it is difficult to swallow tabs.   Has been taking Ritalin infrequently and sometimes taking 2.5 mg.   Children are 16, 19, 41, 37 and 40 yo.   Past Psychiatric Medication Trials: Lexapro Wellbutrin SR Wellbutrin XL Diazepam- Took x 1.  Ritalin Methylphenidate ER  Flowsheet Row Admission (Discharged) from 09/07/2020 in [place] [facility] from 09/02/2020 in [facility] PREADMISSION TESTING Pre-Admission Testing 58 from 08/04/2020 in [facility] PREADMISSION TESTING  C-SSRS RISK CATEGORY No Risk No Risk No Risk       Review of Systems:  Review of Systems  HENT: Positive for trouble swallowing.   Musculoskeletal: Positive for neck pain. Negative for gait problem.  Neurological: Positive for headaches.  Psychiatric/Behavioral:       Please refer to HPI    Medications: I have reviewed the patient's current medications.  Current Outpatient Medications  Medication Sig Dispense Refill  .  aspirin 325 MG tablet Take 325 mg by mouth daily.    Marland Kitchen escitalopram (LEXAPRO) 5 MG/5ML solution Take 20 mLs (20 mg total) by mouth daily. 240 mL 5  . oxyCODONE (OXY IR/ROXICODONE) 5 MG immediate release tablet TAKE 1 TABLET BY MOUTH EVERY 6 HOURS AS NEEDED 40 tablet 0  . tiZANidine (ZANAFLEX) 4 MG tablet TAKE 1 TABLET BY MOUTH 3 TIMES DAILY AS NEEDED 30 tablet 0  . traMADol (ULTRAM) 50  MG tablet Take 50 mg by mouth every 6 (six) hours as needed for moderate pain.    . traMADol (ULTRAM) 50 MG tablet TAKE 1 TABLET BY MOUTH EVERY 6 HOURS AS NEEDED 40 tablet 1  . benzonatate (TESSALON) 200 MG capsule TAKE 1 CAPSULE BY MOUTH 3 TIMES DAILY AS NEEDED FOR COUGH (Patient not taking: Reported on 10/27/2020) 30 capsule 0  . cephALEXin (KEFLEX) 250 MG/5ML suspension TAKE 10 ML BY MOUTH EVERY 6 HOURS FOR 7 DAYS. DISCARD REMAINDER. (Patient not taking: Reported on 10/27/2020) 300 mL 0  . diazepam (VALIUM) 2 MG tablet TAKE 1 TABLET BY MOUTH 2 TIMES DAILY AS NEEDED FOR PANIC ATTACK OR FLASHBACK (Patient not taking: Reported on 10/27/2020) 60 tablet 0  . diclofenac Sodium (VOLTAREN) 1 % GEL Apply 2 g topically 3 (three) times daily as needed for pain. (Patient not taking: Reported on 10/27/2020)    . diclofenac Sodium (VOLTAREN) 1 % GEL APPLY A SMALL AMOUNT TO AFFECTED AREA THREE TIMES A DAY AS NEEDED FOR PAIN (Patient not taking: Reported on 10/27/2020) 100 g 0  . HYDROcodone-acetaminophen (NORCO/VICODIN) 5-325 MG tablet TAKE 1 TABLET BY MOUTH EVERY 4 HOURS AS NEEDED FOR MODERATE PAIN. (Patient not taking: Reported on 10/27/2020) 20 tablet 0  . lactase (LACTAID) 3000 UNITS tablet Take 9,000 Units by mouth 3 (three) times daily with meals.    . methocarbamol (ROBAXIN) 500 MG tablet TAKE 1 TABLET BY MOUTH 4 TIMES DAILY. (Patient not taking: Reported on 10/27/2020) 45 tablet 0  . methylphenidate (RITALIN) 5 MG tablet Take 1 tablet (5 mg total) by mouth 3 (three) times daily. 270 tablet 0  . methylPREDNISolone (MEDROL) 4 MG tablet TAKE 6 TABS BY MOUTH ON DAY 1; 5 TABS ON DAY 2; 4 TABS ON DAY 3; 3 TABS ON DAY 4; 2 TABS ON DAY 5; 1 TAB ON DAY 6 THEN STOP (Patient not taking: Reported on 10/27/2020) 21 tablet 0  . ondansetron (ZOFRAN) 4 MG tablet TAKE 2 TABLETS BY MOUTH 2 TIMES DAILY 20 tablet 0  . ondansetron (ZOFRAN-ODT) 4 MG disintegrating tablet DISSOLVE 1 TABLET BY MOUTH 3 TIMES DAILY AS NEEDED FOR NAUSEA 15  tablet 0   No current facility-administered medications for this visit.    Medication Side Effects: None  Allergies:  Allergies  Allergen Reactions  . Meperidine Hcl Other (See Comments)    Severe headache  . Lactose Intolerance (Gi)     Upset stomach   . Codeine Nausea Only    Past Medical History:  Diagnosis Date  . Anxiety   . Attention deficit disorder (ADD)   . Complication of anesthesia   . Depression   . Dysrhythmia    PACs- after epidural steroid injection in neck  . Family history of adverse reaction to anesthesia    Mother, PONV and "difficult to wake up"  . GERD (gastroesophageal reflux disease)   . History of kidney stones   . MVP (mitral valve prolapse)    diagnosed at 14. Per notes from Dr. Cathlean Cower  in 2011, "no 2D echo evidence"  . Nerve pain    per patient in bilateral hands  . PONV (postoperative nausea and vomiting)   . Sleep apnea     Family History  Problem Relation Age of Onset  . Emphysema Father   . Diabetes Mother   . Mitral valve prolapse Mother   . Hiatal hernia Mother   . Diabetes Sister   . Diabetes Maternal Aunt   . Diabetes Maternal Uncle     Social History   Socioeconomic History  . Marital status: Married    Spouse name: Not on file  . Number of children: 4  . Years of education: Not on file  . Highest education level: Not on file  Occupational History  . Occupation: Nurse  Tobacco Use  . Smoking status: Never Smoker  . Smokeless tobacco: Never Used  Vaping Use  . Vaping Use: Never used  Substance and Sexual Activity  . Alcohol use: No  . Drug use: No  . Sexual activity: Yes    Partners: Male  Other Topics Concern  . 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   Please see review of systems for further details on the patient's review from today.   Objective:   Physical Exam:  BP 118/76   Pulse 91   LMP  (LMP Unknown)   Physical Exam Constitutional:      General: She is not in acute distress. Musculoskeletal:        General: No deformity.  Neurological:     Mental Status: She is alert and oriented to person, place, and time.     Coordination: Coordination normal.  Psychiatric:        Attention and Perception: Attention and perception normal. She does not perceive auditory or visual hallucinations.        Mood and Affect: Mood is anxious. Mood is not depressed. Affect is tearful. Affect is not labile, blunt, angry or inappropriate.        Speech: Speech normal.        Behavior: Behavior normal.        Thought Content: Thought content normal. Thought content is not paranoid or delusional. Thought content does not include homicidal or suicidal ideation. Thought content does not include homicidal or suicidal plan.        Cognition and Memory: Cognition and memory normal.        Judgment: Judgment normal.     Comments: Insight intact     Lab Review:     Component Value Date/Time   NA 141 09/02/2020 1419   K 4.0 09/02/2020 1419   CL 105 09/02/2020 1419   CO2 26 09/02/2020 1419   GLUCOSE 97 09/02/2020 1419   BUN 10 09/02/2020 1419   CREATININE 0.88 09/02/2020 1419   CALCIUM 9.2 09/02/2020 1419   PROT 7.0 11/25/2014 1248   ALBUMIN 4.3  11/25/2014 1248   AST 20 11/25/2014 1248   ALT 20 11/25/2014 1248   ALKPHOS 65 11/25/2014 1248   BILITOT 0.5 11/25/2014 1248   GFRNONAA >60 09/02/2020 1419       Component Value Date/Time   WBC 8.4  09/02/2020 1419   RBC 4.41 09/02/2020 1419   HGB 13.2 09/02/2020 1419   HCT 40.6 09/02/2020 1419   PLT 293 09/02/2020 1419   MCV 92.1 09/02/2020 1419   MCH 29.9 09/02/2020 1419   MCHC 32.5 09/02/2020 1419   RDW 12.3 09/02/2020 1419   LYMPHSABS 1.8 07/29/2015 1534   MONOABS 0.5 07/29/2015 1534   EOSABS 0.1 07/29/2015 1534   BASOSABS 0.0 07/29/2015 1534    No results found for: POCLITH, LITHIUM   No results found for: PHENYTOIN, PHENOBARB, VALPROATE, CBMZ   .res Assessment: Plan:     Patient seen for 30 minutes and time spent reviewing history and discussing recent signs and symptoms. Discussed that Lexapro can be changed from tablet to oral solution since she reports having some swallowing difficulties since her surgery.  Patient agrees with changed to oral solution.  We will continue Lexapro 20 mg daily for anxiety. Continue Ritalin 5 mg 1/2-1 tablet TID prn ADHD.  Discussed that she is reporting signs and symptoms consistent with PTSD and recommend EMDR to process traumatic event that continues to trigger anxiety signs and symptoms.  Discussed that processing traumatic event with also likely be helpful with her goal to return to work.  Referral made to [facility], [facility]. Patient to follow up with this provider in 3 months or sooner if clinically indicated. Patient advised to contact office with any questions, adverse effects, or acute worsening in signs and symptoms.   Shannon Swanson was seen today for anxiety.  Diagnoses and all orders for this visit:  Post traumatic stress disorder (PTSD) -     escitalopram (LEXAPRO) 5 MG/5ML solution; Take 20 mLs (20 mg total) by mouth daily.  ADHD, predominantly inattentive type -     methylphenidate (RITALIN) 5 MG tablet; Take 1 tablet (5 mg total) by mouth 3 (three) times daily.     Please see After Visit Summary for patient specific instructions.  Future Appointments  Date Time Provider Anna  12/22/2020 11:00 AM Lina Sayre, [facility] None  01/26/2021  9:30 AM [name], [facility] CP-CP None    No orders of the defined types were placed in this encounter.   -------------------------------

## 2020-10-28 ENCOUNTER — Other Ambulatory Visit (HOSPITAL_COMMUNITY): Payer: Self-pay

## 2020-10-30 ENCOUNTER — Emergency Department (HOSPITAL_COMMUNITY): Payer: No Typology Code available for payment source

## 2020-10-30 ENCOUNTER — Encounter (HOSPITAL_COMMUNITY): Payer: Self-pay

## 2020-10-30 ENCOUNTER — Emergency Department (HOSPITAL_COMMUNITY)
Admission: EM | Admit: 2020-10-30 | Discharge: 2020-10-30 | Disposition: A | Discharge status: Home / Self Care | Payer: No Typology Code available for payment source | Attending: Emergency Medicine | Admitting: Emergency Medicine

## 2020-10-30 ENCOUNTER — Other Ambulatory Visit: Payer: Self-pay

## 2020-10-30 DIAGNOSIS — R202 Paresthesia of skin: Secondary | ICD-10-CM | POA: Insufficient documentation

## 2020-10-30 DIAGNOSIS — R519 Headache, unspecified: Secondary | ICD-10-CM | POA: Diagnosis not present

## 2020-10-30 DIAGNOSIS — H538 Other visual disturbances: Secondary | ICD-10-CM | POA: Diagnosis not present

## 2020-10-30 DIAGNOSIS — Z86718 Personal history of other venous thrombosis and embolism: Secondary | ICD-10-CM | POA: Diagnosis not present

## 2020-10-30 DIAGNOSIS — Z7982 Long term (current) use of aspirin: Secondary | ICD-10-CM | POA: Diagnosis not present

## 2020-10-30 DIAGNOSIS — R29818 Other symptoms and signs involving the nervous system: Secondary | ICD-10-CM | POA: Diagnosis not present

## 2020-10-30 DIAGNOSIS — R2990 Unspecified symptoms and signs involving the nervous system: Secondary | ICD-10-CM

## 2020-10-30 DIAGNOSIS — H5461 Unqualified visual loss, right eye, normal vision left eye: Secondary | ICD-10-CM | POA: Diagnosis present

## 2020-10-30 LAB — COMPREHENSIVE METABOLIC PANEL
ALT: 29 U/L (ref 0–44)
AST: 29 U/L (ref 15–41)
Albumin: 4.2 g/dL (ref 3.5–5.0)
Alkaline Phosphatase: 84 U/L (ref 38–126)
Anion gap: 7 (ref 5–15)
BUN: 9 mg/dL (ref 6–20)
CO2: 27 mmol/L (ref 22–32)
Calcium: 9.4 mg/dL (ref 8.9–10.3)
Chloride: 105 mmol/L (ref 98–111)
Creatinine, Ser: 0.89 mg/dL (ref 0.44–1.00)
GFR, Estimated: >60 mL/min (ref >60)
Glucose, Bld: 107 mg/dL — ABNORMAL HIGH (ref 70–99)
Potassium: 3.9 mmol/L (ref 3.5–5.1)
Sodium: 139 mmol/L (ref 135–145)
Total Bilirubin: 0.6 mg/dL (ref 0.3–1.2)
Total Protein: 7 g/dL (ref 6.5–8.1)

## 2020-10-30 LAB — APTT: aPTT: 26 seconds (ref 24–36)

## 2020-10-30 LAB — I-STAT CHEM 8, ED
BUN: 9 mg/dL (ref 6–20)
Calcium, Ion: 1.18 mmol/L (ref 1.15–1.40)
Chloride: 104 mmol/L (ref 98–111)
Creatinine, Ser: 0.8 mg/dL (ref 0.44–1.00)
Glucose, Bld: 103 mg/dL — ABNORMAL HIGH (ref 70–99)
HCT: 44 % (ref 36.0–46.0)
Hemoglobin: 15 g/dL (ref 12.0–15.0)
Potassium: 3.9 mmol/L (ref 3.5–5.1)
Sodium: 141 mmol/L (ref 135–145)
TCO2: 26 mmol/L (ref 22–32)

## 2020-10-30 LAB — DIFFERENTIAL
Abs Immature Granulocytes: 0.02 10*3/uL (ref 0.00–0.07)
Basophils Absolute: 0.1 10*3/uL (ref 0.0–0.1)
Basophils Relative: 1 %
Eosinophils Absolute: 0.1 10*3/uL (ref 0.0–0.5)
Eosinophils Relative: 3 %
Immature Granulocytes: 0 %
Lymphocytes Relative: 33 %
Lymphs Abs: 1.7 10*3/uL (ref 0.7–4.0)
Monocytes Absolute: 0.4 10*3/uL (ref 0.1–1.0)
Monocytes Relative: 7 %
Neutro Abs: 2.9 10*3/uL (ref 1.7–7.7)
Neutrophils Relative %: 56 %

## 2020-10-30 LAB — CBC
HCT: 44.4 % (ref 36.0–46.0)
Hemoglobin: 14.4 g/dL (ref 12.0–15.0)
MCH: 29.7 pg (ref 26.0–34.0)
MCHC: 32.4 g/dL (ref 30.0–36.0)
MCV: 91.5 fL (ref 80.0–100.0)
Platelets: 294 10*3/uL (ref 150–400)
RBC: 4.85 MIL/uL (ref 3.87–5.11)
RDW: 12.7 % (ref 11.5–15.5)
WBC: 5.2 10*3/uL (ref 4.0–10.5)
nRBC: 0 % (ref 0.0–0.2)

## 2020-10-30 LAB — PROTIME-INR
INR: 0.9 (ref 0.8–1.2)
Prothrombin Time: 12.5 seconds (ref 11.4–15.2)

## 2020-10-30 MED ORDER — SODIUM CHLORIDE 0.9% FLUSH: 3.0000 mL | Freq: Once | INTRAVENOUS | Status: DC

## 2020-10-30 MED ORDER — IOHEXOL 350 MG/ML SOLN
75.0000 mL | Freq: Once | INTRAVENOUS | Status: AC | PRN
  Administered 2020-10-30: 75 mL via INTRAVENOUS

## 2020-10-30 NOTE — ED Notes (Signed)
Patient transported to MRI 

## 2020-10-30 NOTE — ED Provider Notes (Signed)
[facility] EMERGENCY DEPARTMENT Provider Note   CSN: [id number] Arrival date & time: 10/30/20  1049     History Chief Complaint  Patient presents with  . Tingling  . Headache  . Visual Field Change    Shannon Swanson is a 50 y.o. female.  HPI 50 year old female with a history of anxiety, ADD, depression, DVT presents to the ER with complaints of vision loss out of her right eye for approximately 20 or so minutes this morning.  Patient states that she woke up and could not see centrally out of her eye.  She states she could see if she looks left and right but could not see centrally.  Symptoms resolved after about 15 to 20 minutes, but she does note some numbness and tingling in her left arm.  She states that she has a history of recent cervical spine surgery and already is a little bit numb in the left arm, however she feels like this has been exacerbated.  She was also diagnosed with a DVT in her arm from her IV after her surgery.  She is on 325 mg of aspirin daily, though states that she has not taken this over the last 2 days that she ran out of the medication.  She has no prior history of stroke.  No word slurring, facial droop.  Currently asymptomatic here in the ER.  Patient does endorse a history of headaches, but no diagnosis of migraines.  She states that since her surgery, she has been having worse headaches, which have prompted her to take her home opioids that she is given for her neck.  She denies any history of aura or complicated migraine    Past Medical History:  Diagnosis Date  . Anxiety   . Attention deficit disorder (ADD)   . Complication of anesthesia   . Depression   . Dysrhythmia    PACs- after epidural steroid injection in neck  . Family history of adverse reaction to anesthesia    Mother, PONV and "difficult to wake up"  . GERD (gastroesophageal reflux disease)   . History of kidney stones   . MVP (mitral valve prolapse)    diagnosed at  56. Per notes from Dr. Cathlean Cower in 2011, "no 2D echo evidence"  . Nerve pain    per patient in bilateral hands  . PONV (postoperative nausea and vomiting)   . Sleep apnea     Patient Active Problem List   Diagnosis Date Noted  . DVT (deep venous thrombosis) ([place]) 09/17/2020  . Spinal stenosis of cervical region 09/07/2020  . Post traumatic stress disorder (PTSD) 06/16/2020  . Mixed obsessional thoughts and acts 04/29/2018  . ADHD, predominantly inattentive type 04/29/2018  . Narcoleptic syndrome 04/29/2018  . Dyspnea 08/28/2015  . Chest pain, atypical 08/28/2015  . B12 deficiency 11/26/2014  . Contusion of leg, right 08/21/2014  . LBP (low back pain) 06/19/2014  . Generalized anxiety disorder 06/19/2014  . Routine health maintenance 10/07/2013  . PALPITATIONS, OCCASIONAL 10/02/2008  . Mitral valve disorder 06/01/2007    Past Surgical History:  Procedure Laterality Date  . ANTERIOR CERVICAL DECOMP/DISCECTOMY FUSION N/A 09/07/2020   Procedure: Anterior Cervical Decompression Fusion - Cervical four-Cervical five - Cervical six-Cervical seven with removal Cervical five-six;  Surgeon: Kary Kos, MD;  Location: [facility];  Service: Neurosurgery;  Laterality: N/A;  . CERVICAL DISCECTOMY  2007   anterior approach 2007 Dr Saintclair Halsted   . DILATION AND CURETTAGE OF UTERUS    .  LUMBAR DISC SURGERY    . rotator cuff repair Right 06/2019   partial repair by Dr. Tamera Punt  . TONSILLECTOMY       OB History   No obstetric history on file.     Family History  Problem Relation Age of Onset  . Emphysema Father   . Diabetes Mother   . Mitral valve prolapse Mother   . Hiatal hernia Mother   . Diabetes Sister   . Diabetes Maternal Aunt   . Diabetes Maternal Uncle     Social History   Tobacco Use  . Smoking status: Never Smoker  . Smokeless tobacco: Never Used  Vaping Use  . Vaping Use: Never used  Substance Use Topics  . Alcohol use: No  . Drug use: No    Home Medications Prior to  Admission medications   Medication Sig Start Date End Date Taking? Authorizing Provider  aspirin 325 MG tablet Take 325 mg by mouth daily.    [provider]  benzonatate (TESSALON) 200 MG capsule TAKE 1 CAPSULE BY MOUTH 3 TIMES DAILY AS NEEDED FOR COUGH Patient not taking: Reported on 10/27/2020 08/03/20 08/03/21  Hamrick, Maura L, MD  cephALEXin (KEFLEX) 250 MG/5ML suspension TAKE 10 ML BY MOUTH EVERY 6 HOURS FOR 7 DAYS. DISCARD REMAINDER. Patient not taking: Reported on 10/27/2020 09/17/20 09/17/21  Kary Kos, MD  diazepam (VALIUM) 2 MG tablet TAKE 1 TABLET BY MOUTH 2 TIMES DAILY AS NEEDED FOR PANIC ATTACK OR FLASHBACK Patient not taking: Reported on 10/27/2020 06/16/20 12/13/20  Delight Hoh, MD  diclofenac Sodium (VOLTAREN) 1 % GEL Apply 2 g topically 3 (three) times daily as needed for pain. Patient not taking: Reported on 10/27/2020 05/06/20   [provider]  diclofenac Sodium (VOLTAREN) 1 % GEL APPLY A SMALL AMOUNT TO AFFECTED AREA THREE TIMES A DAY AS NEEDED FOR PAIN Patient not taking: Reported on 10/27/2020 05/06/20 05/06/21  Grier Mitts, PA-C  escitalopram (LEXAPRO) 5 MG/5ML solution Take 20 mLs (20 mg total) by mouth daily. 10/27/20   [name], PMHNP  HYDROcodone-acetaminophen (NORCO/VICODIN) 5-325 MG tablet TAKE 1 TABLET BY MOUTH EVERY 4 HOURS AS NEEDED FOR MODERATE PAIN. Patient not taking: Reported on 10/27/2020 09/08/20 03/07/21  Eleonore Chiquito, NP  lactase (LACTAID) 3000 UNITS tablet Take 9,000 Units by mouth 3 (three) times daily with meals.    [provider]  methocarbamol (ROBAXIN) 500 MG tablet TAKE 1 TABLET BY MOUTH 4 TIMES DAILY. Patient not taking: Reported on 10/27/2020 09/08/20 09/08/21  Eleonore Chiquito, NP  methylphenidate (RITALIN) 5 MG tablet Take 1 tablet (5 mg total) by mouth 3 (three) times daily. 10/27/20   [name], PMHNP  methylPREDNISolone (MEDROL) 4 MG tablet TAKE 6 TABS BY MOUTH ON DAY 1; 5 TABS ON DAY 2; 4  TABS ON DAY 3; 3 TABS ON DAY 4; 2 TABS ON DAY 5; 1 TAB ON DAY 6 THEN STOP Patient not taking: Reported on 10/27/2020 09/22/20 09/22/21  Kary Kos, MD  ondansetron (ZOFRAN) 4 MG tablet TAKE 2 TABLETS BY MOUTH 2 TIMES DAILY 09/08/20 09/08/21  Meyran, Ocie Cornfield, NP  ondansetron (ZOFRAN-ODT) 4 MG disintegrating tablet DISSOLVE 1 TABLET BY MOUTH 3 TIMES DAILY AS NEEDED FOR NAUSEA 08/03/20 08/03/21  Hamrick, Lorin Mercy, MD  oxyCODONE (OXY IR/ROXICODONE) 5 MG immediate release tablet TAKE 1 TABLET BY MOUTH EVERY 6 HOURS AS NEEDED 09/18/20 03/17/21  Kary Kos, MD  tiZANidine (ZANAFLEX) 4 MG tablet TAKE 1 TABLET BY MOUTH 3 TIMES DAILY AS  NEEDED 09/22/20 09/22/21  Kary Kos, MD  traMADol (ULTRAM) 50 MG tablet Take 50 mg by mouth every 6 (six) hours as needed for moderate pain.    [provider]  traMADol (ULTRAM) 50 MG tablet TAKE 1 TABLET BY MOUTH EVERY 6 HOURS AS NEEDED 07/31/20 01/27/21  Kary Kos, MD    Allergies    Meperidine hcl, Lactose intolerance (gi), and Codeine  Review of Systems   Review of Systems  Constitutional: Negative for chills and fever.  HENT: Negative for ear pain and sore throat.   Eyes: Positive for visual disturbance. Negative for pain.  Respiratory: Negative for cough and shortness of breath.   Cardiovascular: Negative for chest pain and palpitations.  Gastrointestinal: Negative for abdominal pain and vomiting.  Genitourinary: Negative for dysuria and hematuria.  Musculoskeletal: Negative for arthralgias and back pain.  Skin: Negative for color change and rash.  Neurological: Positive for numbness. Negative for dizziness, seizures, syncope, facial asymmetry and weakness.  All other systems reviewed and are negative.   Physical Exam Updated Vital Signs BP 129/76   Pulse 95   Temp 98.9 F (37.2 C) (Oral)   Resp 15   LMP  (LMP Unknown)   SpO2 98%   Physical Exam Vitals and nursing note reviewed.  Constitutional:      General: She is not in acute distress.     Appearance: She is well-developed. She is not ill-appearing or diaphoretic.  HENT:     Head: Normocephalic and atraumatic.  Eyes:     General: No visual field deficit.    Conjunctiva/sclera: Conjunctivae normal.  Cardiovascular:     Rate and Rhythm: Normal rate and regular rhythm.     Heart sounds: No murmur heard.   Pulmonary:     Effort: Pulmonary effort is normal. No respiratory distress.     Breath sounds: Normal breath sounds.  Abdominal:     Palpations: Abdomen is soft.     Tenderness: There is no abdominal tenderness.  Musculoskeletal:     Cervical back: Neck supple.  Skin:    General: Skin is warm and dry.  Neurological:     Mental Status: She is alert.     Cranial Nerves: No cranial nerve deficit, dysarthria or facial asymmetry.     Sensory: No sensory deficit.     Motor: No weakness.     Comments: Mental Status:  Alert, thought content appropriate, able to give a coherent history. Speech fluent without evidence of aphasia. Able to follow 2 step commands without difficulty.  Cranial Nerves:  II: Peripheral visual fields grossly normal, pupils equal, round, reactive to light III,IV, VI: ptosis not present, extra-ocular motions intact bilaterally  V,VII: smile symmetric, facial light touch sensation equal VIII: hearing grossly normal to voice  X: uvula elevates symmetrically  XI: bilateral shoulder shrug symmetric and strong XII: midline tongue extension without fassiculations Motor:  Normal tone. 5/5 strength of BUE and BLE major muscle groups including strong and equal grip strength and dorsiflexion/plantar flexion Sensory: light touch normal in all extremities. Cerebellar: normal finger-to-nose with bilateral upper extremities, Romberg sign absent Gait: normal gait and balance. Able to walk on toes and heels with ease.    Psychiatric:        Mood and Affect: Mood normal.     ED Results / Procedures / Treatments   Labs (all labs ordered are listed, but only  abnormal results are displayed) Labs Reviewed  COMPREHENSIVE METABOLIC PANEL - Abnormal; Notable for the following components:  Result Value   Glucose, Bld 107 (*)    All other components within normal limits  I-STAT CHEM 8, ED - Abnormal; Notable for the following components:   Glucose, Bld 103 (*)    All other components within normal limits  PROTIME-INR  APTT  CBC  DIFFERENTIAL    EKG None  Radiology CT HEAD WO CONTRAST  Result Date: 10/30/2020 CLINICAL DATA:  Neuro deficit, acute stroke suspected. Headache with right vision loss this morning. EXAM: CT HEAD WITHOUT CONTRAST TECHNIQUE: Contiguous axial images were obtained from the base of the skull through the vertex without intravenous contrast. COMPARISON:  None. FINDINGS: Brain: No evidence of acute large vascular territory infarction, hemorrhage, hydrocephalus, extra-axial collection or mass lesion/mass effect. Vascular: No hyperdense vessel identified. Skull: No acute fracture. Mild left occipitoparietal flattening/plagiocephaly. Sinuses/Orbits: Clear sinuses.  Unremarkable orbits. Other: No mastoid effusions. IMPRESSION: No evidence of acute intracranial abnormality. Electronically Signed   By: Margaretha Sheffield MD   On: 10/30/2020 12:23   MR BRAIN WO CONTRAST  Result Date: 10/30/2020 CLINICAL DATA:  Stroke-like symptoms. Transient central vision loss in the right eye. Increased left-sided numbness. Recent cervical spine fusion. EXAM: MRI HEAD WITHOUT CONTRAST TECHNIQUE: Multiplanar, multiecho pulse sequences of the brain and surrounding structures were obtained without intravenous contrast. COMPARISON:  Head CT 10/30/2020 FINDINGS: Brain: There is no evidence of an acute infarct, intracranial hemorrhage, mass, midline shift, or extra-axial fluid collection. The ventricles and sulci are normal. A few punctate foci of T2 hyperintensity in the cerebral white matter, predominantly in the frontal lobes, are nonspecific and not  necessarily advanced for age. Vascular: Major intracranial vascular flow voids are preserved. Skull and upper cervical spine: Unremarkable bone marrow signal. Partially imaged susceptibility artifact at C4-5 related to anterior fusion. Sinuses/Orbits: Unremarkable orbits. Clear paranasal sinuses. Trace bilateral mastoid effusions. Other: None. IMPRESSION: Unremarkable appearance of the brain for age. Electronically Signed   By: Logan Bores M.D.   On: 10/30/2020 15:04    Procedures Procedures   Medications Ordered in ED Medications  sodium chloride flush (NS) 0.9 % injection 3 mL (0 mLs Intravenous Hold 10/30/20 1136)  iohexol (OMNIPAQUE) 350 MG/ML injection 75 mL (75 mLs Intravenous Contrast Given 10/30/20 1514)    ED Course  I have reviewed the triage vital signs and the nursing notes.  Pertinent labs & imaging results that were available during my care of the patient were reviewed by me and considered in my medical decision making (see chart for details).    MDM Rules/Calculators/A&P                         50 year old female with an episode of blurry vision out of her right eye with some left arm numbness.  Currently here with a normal neurologic exam and asymptomatic.  Other vitals reassuring.  Neuro exam largely unremarkable.  Labs and imaging ordered, reviewed and interpreted by me.  CBC, CMP largely unremarkable.  Initial CT of the head without any evidence of acute stroke.  I did discuss the patient's case with Dr. Curly Shores, question stroke versus possible complicated migraine.  She recommends MRI of the brain, CT angio of the head and neck.  If this is all normal, she can be discharged with follow-up with neurology for management of headaches.  I personally reviewed and interpreted her imaging.  MRI of the brain, CT angio of the head and neck without any acute abnormalities  Patient was informed of the reassuring  findings.  Will refer to neurology.  Discussed return precautions.  She  was understanding is agreeable.  Stable for discharge.  Final Clinical Impression(s) / ED Diagnoses Final diagnoses:  Stroke-like symptoms    Rx / DC Orders ED Discharge Orders    None       Lyndel Safe 10/30/20 1610    Daleen Bo, MD 10/31/20 [phone]

## 2020-10-30 NOTE — ED Notes (Signed)
Pt returned from MRI °

## 2020-10-30 NOTE — Discharge Instructions (Signed)
Your work-up today was overall reassuring.  Please continue take your aspirin.  Please follow-up with [facility] neurology for evaluation and possible management of your headaches.  Return to the ER for any new or worsening symptoms.

## 2020-10-30 NOTE — ED Notes (Signed)
Patient transported to CT 

## 2020-10-30 NOTE — Care Plan (Signed)
Curb sided about this patient by ED provider: "50 year old female who woke up this morning without central vision in her right eye. This lasted about 15 to 20 minutes and then resolved. She has a history of's recent cervical spine surgery, states that she has left-sided numbness at baseline but feels more numb than normal. This is also intermittent. She does not have any prior history of stroke, she is currently on 32 5 mg of aspirin."  Additional history obtained from the ED provider that the patient does have a significant headache history though she has not been formally diagnosed with migraines she has been having increasing headache frequency to the point that she started to use some opiate pain medication for headaches.  She has no other stroke risk factors other than sleep apnea, as documented below.  Given this information, most likely diagnosis is a complicated headache but MRI brain and CTA head and neck to exclude acute pathology is indicated.  Should the studies result negative, patient should be counseled to follow-up with outpatient neurology and counseled on the risk of medication overuse headache with opiate use.  Should the studies result positive, please reach out to neurology for full neurological consult.  Please additionally reach out if further discussion with patient raises additional neurological concerns, as this is just a brief curbside conversation.  Lesleigh Noe MD-PhD [facility] [phone]     Past Medical History:  Diagnosis Date  . Anxiety   . Attention deficit disorder (ADD)   . Complication of anesthesia   . Depression   . Dysrhythmia    PACs- after epidural steroid injection in neck  . Family history of adverse reaction to anesthesia    Mother, PONV and "difficult to wake up"  . GERD (gastroesophageal reflux disease)   . History of kidney stones   . MVP (mitral valve prolapse)    diagnosed at 70. Per notes from Dr. Cathlean Cower in 2011, "no 2D  echo evidence"  . Nerve pain    per patient in bilateral hands  . PONV (postoperative nausea and vomiting)   . Sleep apnea     Past Surgical History:  Procedure Laterality Date  . ANTERIOR CERVICAL DECOMP/DISCECTOMY FUSION N/A 09/07/2020   Procedure: Anterior Cervical Decompression Fusion - Cervical four-Cervical five - Cervical six-Cervical seven with removal Cervical five-six;  Surgeon: Kary Kos, MD;  Location: [facility];  Service: Neurosurgery;  Laterality: N/A;  . CERVICAL DISCECTOMY  2007   anterior approach 2007 Dr Saintclair Halsted   . DILATION AND CURETTAGE OF UTERUS    . LUMBAR DISC SURGERY    . rotator cuff repair Right 06/2019   partial repair by Dr. Tamera Punt  . TONSILLECTOMY     Social History   Socioeconomic History  . Marital status: Married    Spouse name: Not on file  . Number of children: 4  . Years of education: Not on file  . Highest education level: Not on file  Occupational History  . Occupation: Nurse  Tobacco Use  . Smoking status: Never Smoker  . Smokeless tobacco: Never Used  Vaping Use  . Vaping Use: Never used  Substance and Sexual Activity  . Alcohol use: No  . Drug use: No  . Sexual activity: Yes    Partners: Male  Other Topics Concern  . Not on file  Social History Narrative   [employer]   Married '91   3 daughters '88(adopted '96 '02 ; 2 sons '99 '05   Work - Environmental education officer  Regular exercise: occasionally   Caffeine use: daily   Social Determinants of Health   Financial Resource Strain: Not on file  Food Insecurity: Not on file  Transportation Needs: Not on file  Physical Activity: Not on file  Stress: Not on file  Social Connections: Not on file  Intimate Partner Violence: Not on file

## 2020-10-30 NOTE — ED Triage Notes (Signed)
Pt reports around 630 this morning she was lying in bed when she started to have a headache, central vision loss in her right eye and left hand tingling. The vision loss lasted about 20 mins, it has now resolved but she is still having tingling sensation in her left hand that has improved since it started. Pt had cervical spine surgery Feb 28th by Dr Saintclair Halsted, sent here by him to rule out stroke. No neuro deficits noted in triage, grip strength equal. No facial droop, pt a.o

## 2020-10-30 NOTE — ED Notes (Signed)
Got patient for the bathroom patient did well

## 2020-11-03 ENCOUNTER — Other Ambulatory Visit (HOSPITAL_COMMUNITY): Payer: Self-pay

## 2020-11-04 ENCOUNTER — Other Ambulatory Visit (HOSPITAL_COMMUNITY): Payer: Self-pay

## 2020-11-19 ENCOUNTER — Other Ambulatory Visit (HOSPITAL_COMMUNITY): Payer: Self-pay

## 2020-11-20 ENCOUNTER — Telehealth: Payer: Self-pay | Admitting: Psychiatry

## 2020-11-20 ENCOUNTER — Other Ambulatory Visit (HOSPITAL_COMMUNITY): Payer: Self-pay

## 2020-11-20 DIAGNOSIS — F4310 Post-traumatic stress disorder, unspecified: Secondary | ICD-10-CM

## 2020-11-20 MED ORDER — ESCITALOPRAM OXALATE 5 MG/5ML PO SOLN
20.0000 mg | Freq: Every day | ORAL | 0 refills | Status: DC
Start: 2020-11-20 — End: 2021-01-26
  Filled 2020-11-20: qty 1800, 90d supply, fill #0

## 2020-11-20 NOTE — Telephone Encounter (Signed)
Pt informed

## 2020-11-20 NOTE — Telephone Encounter (Signed)
Please review amount/quantity

## 2020-11-20 NOTE — Addendum Note (Signed)
Addended by: Sharyl Nimrod on: 11/20/2020 11:54 AM   Modules accepted: Orders

## 2020-11-20 NOTE — Telephone Encounter (Addendum)
Please let her know a 90-day supply was submitted

## 2020-11-20 NOTE — Telephone Encounter (Signed)
Pt requesting Lexapro solution dose for 30 or 90 days. 240 ml was only 12 days. Working well. Please send to [facility]. contact pt [phone]

## 2020-11-23 ENCOUNTER — Other Ambulatory Visit (HOSPITAL_COMMUNITY): Payer: Self-pay

## 2020-11-27 ENCOUNTER — Other Ambulatory Visit (HOSPITAL_COMMUNITY): Payer: Self-pay

## 2020-11-27 MED ORDER — TIZANIDINE HCL 4 MG PO TABS
4.0000 mg | ORAL_TABLET | Freq: Three times a day (TID) | ORAL | 0 refills | Status: DC | PRN
  Filled 2020-11-27: qty 30, 10d supply, fill #0

## 2020-12-03 ENCOUNTER — Other Ambulatory Visit (HOSPITAL_COMMUNITY): Payer: Self-pay

## 2020-12-03 MED ORDER — MELOXICAM 7.5 MG PO TABS
ORAL_TABLET | ORAL | 1 refills | Status: DC
  Filled 2020-12-03: qty 30, 60d supply, fill #0
  Filled 2021-07-22: qty 30, 60d supply, fill #1

## 2020-12-08 ENCOUNTER — Other Ambulatory Visit (HOSPITAL_COMMUNITY): Payer: Self-pay

## 2020-12-21 ENCOUNTER — Ambulatory Visit: Payer: No Typology Code available for payment source | Admitting: Psychiatry

## 2020-12-22 ENCOUNTER — Ambulatory Visit (INDEPENDENT_AMBULATORY_CARE_PROVIDER_SITE_OTHER): Payer: No Typology Code available for payment source | Admitting: Psychiatry

## 2020-12-22 ENCOUNTER — Encounter: Payer: Self-pay | Admitting: Psychiatry

## 2020-12-22 ENCOUNTER — Other Ambulatory Visit: Payer: Self-pay

## 2020-12-22 DIAGNOSIS — F4310 Post-traumatic stress disorder, unspecified: Secondary | ICD-10-CM
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
## 2020-12-31 ENCOUNTER — Other Ambulatory Visit (HOSPITAL_COMMUNITY): Payer: Self-pay

## 2020-12-31 MED ORDER — MELOXICAM 7.5 MG PO TABS
7.5000 mg | ORAL_TABLET | Freq: Two times a day (BID) | ORAL | 1 refills | Status: DC
  Filled 2020-12-31 – 2021-01-06 (×2): qty 60, 30d supply, fill #0
  Filled 2021-03-09 – 2021-03-23 (×3): qty 60, 30d supply, fill #1

## 2021-01-06 ENCOUNTER — Other Ambulatory Visit (HOSPITAL_COMMUNITY): Payer: Self-pay

## 2021-01-08 ENCOUNTER — Other Ambulatory Visit (HOSPITAL_COMMUNITY): Payer: Self-pay

## 2021-01-21 ENCOUNTER — Ambulatory Visit: Payer: No Typology Code available for payment source | Admitting: Psychiatry

## 2021-01-26 ENCOUNTER — Other Ambulatory Visit (HOSPITAL_COMMUNITY): Payer: Self-pay

## 2021-01-26 ENCOUNTER — Ambulatory Visit (INDEPENDENT_AMBULATORY_CARE_PROVIDER_SITE_OTHER): Payer: No Typology Code available for payment source | Admitting: Psychiatry

## 2021-01-26 ENCOUNTER — Other Ambulatory Visit: Payer: Self-pay

## 2021-01-26 ENCOUNTER — Encounter: Payer: Self-pay | Admitting: Psychiatry

## 2021-01-26 VITALS — BP 131/83 | HR 77

## 2021-01-26 DIAGNOSIS — F4310 Post-traumatic stress disorder, unspecified: Secondary | ICD-10-CM | POA: Diagnosis not present

## 2021-01-26 DIAGNOSIS — G4700 Insomnia, unspecified: Secondary | ICD-10-CM | POA: Diagnosis not present

## 2021-01-26 DIAGNOSIS — F900 Attention-deficit hyperactivity disorder, predominantly inattentive type: Secondary | ICD-10-CM | POA: Diagnosis not present

## 2021-01-26 MED ORDER — ESCITALOPRAM OXALATE 5 MG/5ML PO SOLN
20.0000 mg | Freq: Every day | ORAL | 0 refills | Status: DC
  Filled 2021-01-26 – 2021-03-23 (×3): qty 1800, 90d supply, fill #0

## 2021-01-26 MED ORDER — TRAZODONE HCL 50 MG PO TABS
ORAL_TABLET | ORAL | 0 refills | Status: DC
  Filled 2021-01-26: qty 30, 15d supply, fill #0

## 2021-01-26 MED ORDER — METHYLPHENIDATE HCL 5 MG PO TABS
5.0000 mg | ORAL_TABLET | Freq: Three times a day (TID) | ORAL | 0 refills | Status: DC
  Filled 2021-01-26: qty 270, 90d supply, fill #0

## 2021-01-26 NOTE — Progress Notes (Signed)
AL GAGEN [id number] [birth year] 50 y.o.  Subjective:   Patient ID:  Shannon Swanson is a 50 y.o. (DOB 01/10/1971) female.  Chief Complaint:  Chief Complaint  Patient presents with   Insomnia   Anxiety   ADHD     HPI Shannon Swanson presents to the office today for follow-up of anxiety, insomnia, and ADHD. She reports that she has high anxiety in response to circumstances. She reports that she has had a few episodes where she could not get a breath due to anxiety. She reports that she prefers to be working because wen she is at home she notices she is "thinking about everything" and pain. She reports some worry. She reports that she has depression at times when anxiety is higher. She reports that she tries to cope by helping others and praying. She reports occasional crying spells.   She reports that sometimes she will take 1/2 of methylphenidate 5 mg and take the other 1/2 tab several hours later because she would like to take the least amount of medication possible. She reports that at times she needs to take the whole tablet. She has had increased difficulty with concentration after surgery and stressors. Energy is lower after taking Meloxicam and improved with Methylphenidate. Appetite has been ok. She reports that she has gained weight due to stress and decreased activity. She reports that her sleep has been restless due to pain and never sleeps well. Denies SI.   She reports that she has started PT and is hopeful that this will help with pain.   She reports that oral solution Lexapro is easier.   Working light duty in [employer]. Works 3-4 hours a day and 3-4 days a week.  Past Psychiatric Medication Trials: Lexapro Wellbutrin SR Wellbutrin XL Diazepam- Took x 1. Ritalin Methylphenidate ER  Flowsheet Row Admission (Discharged) from 09/07/2020 in [facility] from 09/02/2020 in [facility] PREADMISSION  TESTING Pre-Admission Testing 58 from 08/04/2020 in [facility] PREADMISSION TESTING  C-SSRS RISK CATEGORY No Risk No Risk No Risk        Review of Systems:  Review of Systems  Musculoskeletal:  Positive for back pain and neck pain. Negative for gait problem.  Neurological:  Positive for numbness.  Psychiatric/Behavioral:         Please refer to HPI   Went to ER for stroke-like sx's on 10/27/20. She had recurrence of visual changes and numbness and tingling in the same arm that was affected by blood clot in the past.   Medications: I have reviewed the patient's current medications.  Current Outpatient Medications  Medication Sig Dispense Refill   lactase (LACTAID) 3000 UNITS tablet Take 9,000 Units by mouth 3 (three) times daily with meals.     meloxicam (MOBIC) 7.5 MG tablet Take 1 tablet (7.5 mg total) by mouth 2 (two) times daily. 60 tablet 1   tiZANidine (ZANAFLEX) 4 MG tablet TAKE 1 TABLET BY MOUTH 3 TIMES DAILY AS NEEDED 30 tablet 0   traZODone (DESYREL) 50 MG tablet Take 1/2 - 2 tablets by mouth at bedtime as needed 30 tablet 0   diclofenac Sodium (VOLTAREN) 1 % GEL Apply 2 g topically 3 (three) times daily as needed for pain. (Patient not taking: Reported on 10/27/2020)     diclofenac Sodium (VOLTAREN) 1 % GEL APPLY A SMALL AMOUNT TO AFFECTED AREA THREE TIMES A DAY AS NEEDED FOR PAIN (Patient not  taking: Reported on 10/27/2020) 100 g 0   escitalopram (LEXAPRO) 5 MG/5ML solution Take 20 mLs (20 mg total) by mouth daily. 1800 mL 0   HYDROcodone-acetaminophen (NORCO/VICODIN) 5-325 MG tablet TAKE 1 TABLET BY MOUTH EVERY 4 HOURS AS NEEDED FOR MODERATE PAIN. (Patient not taking: No sig reported) 20 tablet 0   meloxicam (MOBIC) 7.5 MG tablet take 1 tablet by mouth every other day 60 tablet 1   methocarbamol (ROBAXIN) 500 MG tablet TAKE 1 TABLET BY MOUTH 4 TIMES DAILY. (Patient not taking: Reported on 10/27/2020) 45 tablet 0   methylphenidate (RITALIN) 5 MG tablet Take 1  tablet (5 mg total) by mouth 3 (three) times daily. 270 tablet 0   methylPREDNISolone (MEDROL) 4 MG tablet TAKE 6 TABS BY MOUTH ON DAY 1; 5 TABS ON DAY 2; 4 TABS ON DAY 3; 3 TABS ON DAY 4; 2 TABS ON DAY 5; 1 TAB ON DAY 6 THEN STOP (Patient not taking: Reported on 10/27/2020) 21 tablet 0   ondansetron (ZOFRAN) 4 MG tablet TAKE 2 TABLETS BY MOUTH 2 TIMES DAILY 20 tablet 0   ondansetron (ZOFRAN-ODT) 4 MG disintegrating tablet DISSOLVE 1 TABLET BY MOUTH 3 TIMES DAILY AS NEEDED FOR NAUSEA 15 tablet 0   oxyCODONE (OXY IR/ROXICODONE) 5 MG immediate release tablet TAKE 1 TABLET BY MOUTH EVERY 6 HOURS AS NEEDED (Patient not taking: Reported on 01/26/2021) 40 tablet 0   tiZANidine (ZANAFLEX) 4 MG tablet Take 1 tablet (4 mg total) by mouth 3 (three) times daily as needed. 30 tablet 0   traMADol (ULTRAM) 50 MG tablet Take 50 mg by mouth every 6 (six) hours as needed for moderate pain.     traMADol (ULTRAM) 50 MG tablet TAKE 1 TABLET BY MOUTH EVERY 6 HOURS AS NEEDED 40 tablet 1   No current facility-administered medications for this visit.    Medication Side Effects: None  Allergies:  Allergies  Allergen Reactions   Meperidine Hcl Other (See Comments)    Severe headache   Lactose Intolerance (Gi)     Upset stomach    Codeine Nausea Only    Past Medical History:  Diagnosis Date   Anxiety    Attention deficit disorder (ADD)    Complication of anesthesia    Depression    Dysrhythmia    PACs- after epidural steroid injection in neck   Family history of adverse reaction to anesthesia    Mother, PONV and "difficult to wake up"   GERD (gastroesophageal reflux disease)    History of kidney stones    MVP (mitral valve prolapse)    diagnosed at 38. Per notes from Dr. Cathlean Cower in 2011, "no 2D echo evidence"   Nerve pain    per patient in bilateral hands   PONV (postoperative nausea and vomiting)    Sleep apnea     Past Medical History, Surgical history, Social history, and Family history were  reviewed and updated as appropriate.   Please see review of systems for further details on the patient's review from today.   Objective:   Physical Exam:  BP 131/83   Pulse 77   LMP  (LMP Unknown)   Physical Exam Constitutional:      General: She is not in acute distress. Musculoskeletal:        General: No deformity.  Neurological:     Mental Status: She is alert and oriented to person, place, and time.     Coordination: Coordination normal.  Psychiatric:  Attention and Perception: Attention and perception normal. She does not perceive auditory or visual hallucinations.        Mood and Affect: Mood is anxious. Mood is not depressed. Affect is not labile, blunt, angry or inappropriate.        Speech: Speech normal.        Behavior: Behavior normal. Behavior is cooperative.        Thought Content: Thought content normal. Thought content is not paranoid or delusional. Thought content does not include homicidal or suicidal ideation. Thought content does not include homicidal or suicidal plan.        Cognition and Memory: Cognition and memory normal.        Judgment: Judgment normal.     Comments: Insight intact    Lab Review:     Component Value Date/Time   NA 141 10/30/2020 1136   K 3.9 10/30/2020 1136   CL 104 10/30/2020 1136   CO2 27 10/30/2020 1116   GLUCOSE 103 (H) 10/30/2020 1136   BUN 9 10/30/2020 1136   CREATININE 0.80 10/30/2020 1136   CALCIUM 9.4 10/30/2020 1116   PROT 7.0 10/30/2020 1116   ALBUMIN 4.2 10/30/2020 1116   AST 29 10/30/2020 1116   ALT 29 10/30/2020 1116   ALKPHOS 84 10/30/2020 1116   BILITOT 0.6 10/30/2020 1116   GFRNONAA >60 10/30/2020 1116       Component Value Date/Time   WBC 5.2 10/30/2020 1116   RBC 4.85 10/30/2020 1116   HGB 15.0 10/30/2020 1136   HCT 44.0 10/30/2020 1136   PLT 294 10/30/2020 1116   MCV 91.5 10/30/2020 1116   MCH 29.7 10/30/2020 1116   MCHC 32.4 10/30/2020 1116   RDW 12.7 10/30/2020 1116   LYMPHSABS 1.7  10/30/2020 1116   MONOABS 0.4 10/30/2020 1116   EOSABS 0.1 10/30/2020 1116   BASOSABS 0.1 10/30/2020 1116    No results found for: POCLITH, LITHIUM   No results found for: PHENYTOIN, PHENOBARB, VALPROATE, CBMZ   .res Assessment: Plan:    Patient seen for 30 minutes and time spent counseling pt regarding treatment options.  She reports that she would like to continue oral solution of escitalopram since this has been easier for her to swallow.  She reports that she would also like to continue methylphenidate immediate release since she is able to take 1/2 to 1 tablet and only as needed and prefers to take the least amount of medication possible.  She reports that she may wish to consider changing this in the future. Discussed potential benefits, risks, and side effects of trazodone.  Discussed that she could start with very low dose due to history of medication sensitivity.  Discussed that trazodone can be taken only as needed and that she could take it infrequently if she prefers when she has had multiple consecutive nights of poor sleep.  Will send in prescription for trazodone 50 mg 1/2-2 tabs as needed for insomnia. Recommend continuing psychotherapy with Lina Sayre, [facility]. Patient follow-up with this provider in 3 months or sooner if clinically indicated. Patient advised to contact office with any questions, adverse effects, or acute worsening in signs and symptoms.    Shannon Swanson was seen today for insomnia, anxiety and adhd.  Diagnoses and all orders for this visit:  Insomnia, unspecified type -     traZODone (DESYREL) 50 MG tablet; Take 1/2 - 2 tablets by mouth at bedtime as needed  Post traumatic stress disorder (PTSD) -     escitalopram (  LEXAPRO) 5 MG/5ML solution; Take 20 mLs (20 mg total) by mouth daily.  ADHD, predominantly inattentive type -     methylphenidate (RITALIN) 5 MG tablet; Take 1 tablet (5 mg total) by mouth 3 (three) times daily.    Please see After Visit  Summary for patient specific instructions.  Future Appointments  Date Time Provider [place]  02/09/2021  3:00 PM Lina Sayre, [facility] CP-CP None  02/23/2021  1:00 PM Lina Sayre, [facility] CP-CP None  03/09/2021  1:00 PM Lina Sayre, [facility] CP-CP None  04/28/2021 11:00 AM [name], PMHNP CP-CP None    No orders of the defined types were placed in this encounter.   -------------------------------

## 2021-02-02 ENCOUNTER — Other Ambulatory Visit (HOSPITAL_COMMUNITY): Payer: Self-pay

## 2021-02-02 MED ORDER — TRAMADOL HCL 50 MG PO TABS
ORAL_TABLET | ORAL | 1 refills | Status: DC
  Filled 2021-02-02 – 2021-07-22 (×2): qty 40, 10d supply, fill #0

## 2021-02-02 MED ORDER — TIZANIDINE HCL 4 MG PO TABS
ORAL_TABLET | ORAL | 0 refills | Status: DC
  Filled 2021-02-02 – 2021-07-22 (×4): qty 30, 10d supply, fill #0

## 2021-02-09 ENCOUNTER — Other Ambulatory Visit: Payer: Self-pay

## 2021-02-09 ENCOUNTER — Ambulatory Visit (INDEPENDENT_AMBULATORY_CARE_PROVIDER_SITE_OTHER): Payer: No Typology Code available for payment source | Admitting: Psychiatry

## 2021-02-09 DIAGNOSIS — F4310 Post-traumatic stress disorder, unspecified: Secondary | ICD-10-CM | POA: Diagnosis not present

## 2021-02-09 NOTE — Progress Notes (Signed)
[facility] Counselor/Therapist Progress Note  Patient ID: Shannon Swanson, MRN: [id number],    Date: 02/09/2021  Time Spent: 46 minutes start time 3:15 PM end time 4:01   Treatment Type: Individual Therapy  Reported Symptoms: chronic pain, depressed, anxiety, panic, triggered responses, flashbacks, hypervigilance, sleep issues  Mental Status Exam:  Appearance:   Well Groomed     Behavior:  Appropriate  Motor:  Normal  Speech/Language:   Normal Rate  Affect:  Appropriate  Mood:  anxious  Thought process:  normal  Thought content:    WNL  Sensory/Perceptual disturbances:    WNL  Orientation:  oriented to person, place, time/date, and situation  Attention:  Good  Concentration:  Good  Memory:  WNL  Fund of knowledge:   Good  Insight:    Good  Judgment:   Good  Impulse Control:  Good   Risk Assessment: Danger to Self:  No Self-injurious Behavior: No Danger to Others: No Duty to Warn:no Physical Aggression / Violence:No  Access to Firearms a concern: No  Gang Involvement:No   Subjective: Patient was present for session.  She shared she has been feeling depressed recently.  After her last doctor's appointment she realized there are many things that she can't do now that she could do before the injury at work and that has triggered lots of fear inside of her.  She is still working reduced hours which makes her feel pathetic.  Patient explained she wants to figure out what she can do to be productive and be able to bring in the income that she used to for herself and her family.  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  Patient shared she is hoping that if she continues to work in physical therapy that things might be able to change but it gets very depressing at times.  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  Was encouraged to try and stay focused on handling 1 situation at the time.  Patient identified different triggers that seem to bring up emotions from the trauma.  Developed treatment plans and set goals in session did not sign due to [place] and need for social distancing.  Had patient participate in some grounding exercises.  Patient tried to do a safe place exercise but it did not go well because her safe place had been the beach and now when she goes to the beach she cannot do things the way she used to she cannot get on the beach and lay out she cannot sit in the chairs that she would like to sit in and that was very triggering for her.  Encouraged her to practice the other grounding techniques that down seem to be connected to any of her pain or trauma issues.  Interventions: Solution-Oriented/Positive Psychology  Diagnosis:   ICD-10-CM   1. Post traumatic stress disorder (PTSD)  F43.10       Plan: Patient is to practice grounding techniques from session that are not attached to any chronic pain or triggered issues.  Patient is to  try and remind herself regularly that things can move in a positive direction.  Patient is to continue working with providers to heal her neck and back issues.  Patient is to take medication as directed. Long-term goal: Recall the traumatic event without becoming overwhelming negative emotions Short-term goal: Practice implement relaxation training as a coping mechanism for tension panic stress anger and anxiety  Lina Sayre, [facility]

## 2021-02-10 ENCOUNTER — Other Ambulatory Visit (HOSPITAL_COMMUNITY): Payer: Self-pay

## 2021-02-23 ENCOUNTER — Ambulatory Visit (INDEPENDENT_AMBULATORY_CARE_PROVIDER_SITE_OTHER): Payer: No Typology Code available for payment source | Admitting: Psychiatry

## 2021-02-23 ENCOUNTER — Other Ambulatory Visit: Payer: Self-pay

## 2021-02-23 DIAGNOSIS — F4310 Post-traumatic stress disorder, unspecified: Secondary | ICD-10-CM

## 2021-02-23 NOTE — Progress Notes (Signed)
      [facility] Counselor/Therapist Progress Note  Patient ID: KRESTA SCAFFIDI, MRN: [id number],    Date: 02/23/2021  Time Spent: 65 minutes start time 1:08 PM end time 2:13 PM  Treatment Type: Individual Therapy  Reported Symptoms: chronic pain, sadness, anxiety,panic, rumination, triggered responses  Mental Status Exam:  Appearance:   Well Groomed     Behavior:  Appropriate  Motor:  Normal  Speech/Language:   Normal Rate  Affect:  Appropriate  Mood:  anxious and sad  Thought process:  normal  Thought content:    WNL  Sensory/Perceptual disturbances:    WNL  Orientation:  oriented to person, place, time/date, and situation  Attention:  Good  Concentration:  Good  Memory:  WNL  Fund of knowledge:   Good  Insight:    Good  Judgment:   Good  Impulse Control:  Good   Risk Assessment: Danger to Self:  No Self-injurious Behavior: No Danger to Others: No Duty to Warn:no Physical Aggression / Violence:No  Access to Firearms a concern: No  Gang Involvement:No   Subjective: Patient was present for session.  She shared that she was having a hard time recently with her neck pain.  She shared she woke up one morning in extreme pain and couldn't move so she had to call her son to have him come help her. She shared she had to leave work early on Friday due to the pain.  Starting EMDR set on the trauma from work that caused her injury.  Picture patient running at her, suds level 10, negative cognition "I am not safe" felt fear in her neck and shoulders.  Patient was only able to reduce suds to 6/7.  As processing took place she was able to recognize that she has lots of anger over the whole situation that she needs to release.  She also recognized fear about her future and what she is going to do is connected to it as well.  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  The other issue is she is fearful of her physical limitations since the injury and uncertain about where things are going to lead.  Patient stated financially she needs to be working and wants to be working but is not sure how to proceed with her limitations.  Patient was encouraged to journal and to try and take things 1 step at a time.  She was encouraged to contact clinician of her emotions get to an unmanageable point.  Interventions: Solution-Oriented/Positive Psychology, Eye Movement Desensitization and Reprocessing (EMDR), and Insight-Oriented  Diagnosis:   ICD-10-CM   1. Post traumatic stress disorder (PTSD)  F43.10       Plan: Patient is to use coping skills to decrease triggered responses.  Patient is to work on journaling to release negative emotions appropriately.  Patient is to continue working with her providers on her injuries.  Patient is to take medication as directed Long-term goal: Recall the traumatic event without becoming overwhelming negative emotions Short-term goal: Practice implement relaxation training as a coping mechanism for tension panic stress anger and anxiety  Lina Sayre, [facility]

## 2021-02-26 LAB — COLOGUARD

## 2021-03-09 ENCOUNTER — Other Ambulatory Visit: Payer: Self-pay

## 2021-03-09 ENCOUNTER — Other Ambulatory Visit (HOSPITAL_COMMUNITY): Payer: Self-pay

## 2021-03-09 ENCOUNTER — Ambulatory Visit (INDEPENDENT_AMBULATORY_CARE_PROVIDER_SITE_OTHER): Payer: No Typology Code available for payment source | Admitting: Psychiatry

## 2021-03-09 DIAGNOSIS — F4310 Post-traumatic stress disorder, unspecified: Secondary | ICD-10-CM | POA: Diagnosis not present

## 2021-03-09 MED ORDER — TRAMADOL HCL 50 MG PO TABS
ORAL_TABLET | ORAL | 1 refills | Status: DC
  Filled 2021-03-09 – 2021-03-23 (×3): qty 40, 10d supply, fill #0

## 2021-03-09 NOTE — Progress Notes (Signed)
      [facility] Counselor/Therapist Progress Note  Patient ID: Shannon Swanson, MRN: [id number],    Date: 03/09/2021  Time Spent: 64 minutes start time 1:08 PM end time 2:12 PM  Treatment Type: Individual Therapy  Reported Symptoms: anxiety, Chronic pain, crying spells, sadness, triggered responses  Mental Status Exam:  Appearance:   Well Groomed     Behavior:  Appropriate  Motor:  Normal  Speech/Language:   Normal Rate  Affect:  Appropriate  teraful  Mood:  anxious-panic  Thought process:  normal  Thought content:    WNL  Sensory/Perceptual disturbances:    WNL  Orientation:  oriented to person, place, time/date, and situation  Attention:  Good  Concentration:  Good  Memory:  WNL  Fund of knowledge:   Good  Insight:    Good  Judgment:   Good  Impulse Control:  Good   Risk Assessment: Danger to Self:  No Self-injurious Behavior: No Danger to Others: No Duty to Warn:no Physical Aggression / Violence:No  Access to Firearms a concern: No  Gang Involvement:No   Subjective: Patient was present for session. She shared that she had a bad morning due to the pain issues, financial stress, and missing her husband.  She shared that going to the doctor is super hard for her due to not knowing what she is going to do and what is going to happen.  Patient did EMDR set on only being able to work 6 hours, suds level 7, negative cognition "I am trapped" felt sadness and frustration in her neck and shoulders.  Patient struggled greatly with the processing.  During set she had a panic attack and had to be grounded.  Patient was able to reduce suds level to 5 and develop some visualizations that she can utilize as she goes through the whole process with her physical issues.  Patient was able to recognize that the chronic pain issues are overwhelming but she has to take things 1 step at a time and not feel that she has to make things happen.  Patient was encouraged to journal the positive  things that occur and to try and focus on grounding herself regularly.  Patient reported feeling calmer at the end of session.  She was encouraged to take things 1 step at a time.  Interventions: Cognitive Behavioral Therapy, Solution-Oriented/Positive Psychology, Eye Movement Desensitization and Reprocessing (EMDR), and Insight-Oriented  Diagnosis:   ICD-10-CM   1. Post traumatic stress disorder (PTSD)  F43.10       Plan: Patient is to use CBT and coping skills to decrease triggered responses and anxiety.  Patient is to continue working with providers on her medical issues.  Patient is to take medication as directed.  Patient is to journal her experience and to remind herself regularly of positive things.  Patient is to remind herself to take things 1 step at a time and to listen to her body. Long-term goal: Recall the traumatic event without becoming overwhelming negative emotions Short-term goal: Practice implement relaxation training as a coping mechanism for tension panic stress anger and anxiety  Lina Sayre, [facility]

## 2021-03-10 ENCOUNTER — Other Ambulatory Visit (HOSPITAL_COMMUNITY): Payer: Self-pay | Admitting: Neurosurgery

## 2021-03-10 ENCOUNTER — Other Ambulatory Visit (HOSPITAL_COMMUNITY): Payer: Self-pay

## 2021-03-10 ENCOUNTER — Other Ambulatory Visit: Payer: Self-pay | Admitting: Neurosurgery

## 2021-03-10 DIAGNOSIS — M542 Cervicalgia: Secondary | ICD-10-CM

## 2021-03-17 ENCOUNTER — Other Ambulatory Visit (HOSPITAL_COMMUNITY): Payer: Self-pay

## 2021-03-18 ENCOUNTER — Other Ambulatory Visit (HOSPITAL_COMMUNITY): Payer: Self-pay

## 2021-03-18 LAB — COLOGUARD: COLOGUARD: NEGATIVE

## 2021-03-19 ENCOUNTER — Other Ambulatory Visit (HOSPITAL_COMMUNITY): Payer: Self-pay

## 2021-03-23 ENCOUNTER — Other Ambulatory Visit (HOSPITAL_COMMUNITY): Payer: Self-pay

## 2021-03-23 ENCOUNTER — Other Ambulatory Visit: Payer: Self-pay

## 2021-03-23 ENCOUNTER — Ambulatory Visit: Payer: No Typology Code available for payment source | Admitting: Psychiatry

## 2021-03-23 ENCOUNTER — Ambulatory Visit (HOSPITAL_COMMUNITY)
Admission: RE | Admit: 2021-03-23 | Discharge: 2021-03-23 | Disposition: A | Discharge status: Home / Self Care | Payer: PRIVATE HEALTH INSURANCE | Source: Ambulatory Visit | Attending: Neurosurgery | Admitting: Neurosurgery

## 2021-03-23 DIAGNOSIS — M542 Cervicalgia: Secondary | ICD-10-CM | POA: Insufficient documentation

## 2021-03-24 ENCOUNTER — Ambulatory Visit (HOSPITAL_COMMUNITY): Payer: PRIVATE HEALTH INSURANCE

## 2021-03-29 ENCOUNTER — Other Ambulatory Visit (HOSPITAL_COMMUNITY): Payer: Self-pay | Admitting: Neurosurgery

## 2021-03-29 ENCOUNTER — Other Ambulatory Visit: Payer: Self-pay | Admitting: Neurosurgery

## 2021-03-29 DIAGNOSIS — M5412 Radiculopathy, cervical region: Secondary | ICD-10-CM

## 2021-04-07 ENCOUNTER — Ambulatory Visit: Payer: No Typology Code available for payment source | Admitting: Psychiatry

## 2021-04-13 ENCOUNTER — Other Ambulatory Visit: Payer: Self-pay

## 2021-04-13 ENCOUNTER — Ambulatory Visit (HOSPITAL_COMMUNITY)
Admission: RE | Admit: 2021-04-13 | Discharge: 2021-04-13 | Disposition: A | Discharge status: Home / Self Care | Payer: PRIVATE HEALTH INSURANCE | Source: Ambulatory Visit | Attending: Neurosurgery | Admitting: Neurosurgery

## 2021-04-13 DIAGNOSIS — M5412 Radiculopathy, cervical region: Secondary | ICD-10-CM | POA: Diagnosis not present

## 2021-04-20 ENCOUNTER — Other Ambulatory Visit: Payer: Self-pay

## 2021-04-20 ENCOUNTER — Ambulatory Visit (INDEPENDENT_AMBULATORY_CARE_PROVIDER_SITE_OTHER): Payer: No Typology Code available for payment source | Admitting: Psychiatry

## 2021-04-20 DIAGNOSIS — F4310 Post-traumatic stress disorder, unspecified: Secondary | ICD-10-CM | POA: Diagnosis not present

## 2021-04-20 NOTE — Progress Notes (Addendum)
      [facility] Counselor/Therapist Progress Note  Patient ID: Shannon Swanson, MRN: [id number],    Date: 04/20/2021  Time Spent: 47 minutes start time 3:13 PM end time 4 PM  Treatment Type: Individual Therapy  Reported Symptoms: pain, anxiety, fatigue, depression, flashbacks, triggered responses  Mental Status Exam:  Appearance:   Well Groomed     Behavior:  Appropriate  Motor:  Normal  Speech/Language:   Normal Rate  Affect:  Appropriate and Tearful  Mood:  anxious and sad  Thought process:  normal  Thought content:    WNL  Sensory/Perceptual disturbances:    WNL  Orientation:  oriented to person, place, time/date, and situation  Attention:  Good  Concentration:  Good  Memory:  WNL  Fund of knowledge:   Good  Insight:    Good  Judgment:   Good  Impulse Control:  Good   Risk Assessment: Danger to Self:  No Self-injurious Behavior: No Danger to Others: No Duty to Warn:no Physical Aggression / Violence:No  Access to Firearms a concern: No  Gang Involvement:No   Subjective: Patient was present for session.  She shared she has had bad days due to anxiety.  She went on to share that the MRI and CT showed she is having more issues with her spine and she isn't what is going to happen. 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  Had patient process the fear of the unknown, suds level 8, negative cognition "I am stuck" felt pain in her chest.  Patient was only able to reduce suds level to 6.  Discussed the difficulty of her being able to move forward because she still does not know what will happen and her desire to return to her unit is very great.  Encouraged patient to focus on self-care and to continue working with her providers to make sure that she is able to get her physical needs met.  Interventions: Solution-Oriented/Positive Psychology, Eye Movement Desensitization and Reprocessing (EMDR), and  Insight-Oriented  Diagnosis:   ICD-10-CM   1. Post traumatic stress disorder (PTSD)  F43.10       Plan: Patient is to use CBT and coping skills to decrease triggered responses.  Patient is to continue working with her providers to help her body heal and to deal with her emotions appropriately.  Patient is to try and remind herself to focus on the things that she can control fix and change. Long-term goal: Recall the traumatic event without becoming overwhelming negative emotions Short-term goal: Practice implement relaxation training as a coping mechanism for tension panic stress anger and anxiety    Lina Sayre, [facility]

## 2021-04-28 ENCOUNTER — Encounter: Payer: Self-pay | Admitting: Psychiatry

## 2021-04-28 ENCOUNTER — Other Ambulatory Visit (HOSPITAL_COMMUNITY): Payer: Self-pay

## 2021-04-28 ENCOUNTER — Ambulatory Visit (INDEPENDENT_AMBULATORY_CARE_PROVIDER_SITE_OTHER): Payer: No Typology Code available for payment source | Admitting: Psychiatry

## 2021-04-28 ENCOUNTER — Other Ambulatory Visit: Payer: Self-pay

## 2021-04-28 VITALS — BP 125/77 | HR 87

## 2021-04-28 DIAGNOSIS — G4700 Insomnia, unspecified: Secondary | ICD-10-CM

## 2021-04-28 DIAGNOSIS — F4323 Adjustment disorder with mixed anxiety and depressed mood: Secondary | ICD-10-CM

## 2021-04-28 DIAGNOSIS — F422 Mixed obsessional thoughts and acts: Secondary | ICD-10-CM

## 2021-04-28 DIAGNOSIS — F900 Attention-deficit hyperactivity disorder, predominantly inattentive type: Secondary | ICD-10-CM | POA: Diagnosis not present

## 2021-04-28 DIAGNOSIS — F411 Generalized anxiety disorder: Secondary | ICD-10-CM | POA: Diagnosis not present

## 2021-04-28 DIAGNOSIS — F4310 Post-traumatic stress disorder, unspecified: Secondary | ICD-10-CM

## 2021-04-28 MED ORDER — ESCITALOPRAM OXALATE 5 MG/5ML PO SOLN
20.0000 mg | Freq: Every day | ORAL | 0 refills | Status: DC
  Filled 2021-04-28 – 2021-07-22 (×2): qty 1800, 90d supply, fill #0

## 2021-04-28 MED ORDER — METHYLPHENIDATE HCL 5 MG PO TABS
5.0000 mg | ORAL_TABLET | Freq: Three times a day (TID) | ORAL | 0 refills | Status: DC
  Filled 2021-04-28: qty 270, 90d supply, fill #0

## 2021-04-28 NOTE — Progress Notes (Signed)
Shannon Swanson [id number] 01-04-1971 50 y.o.  Subjective:   Patient ID:  Shannon Swanson is a 50 y.o. (DOB 21-Oct-1970) female.  Chief Complaint:  Chief Complaint  Patient presents with   Anxiety   Insomnia   ADHD     HPI [name] presents to the office today for follow-up of anxiety and ADD. She reports increased anxiety and stress in response to her physical issues. "Didn't get good news last week." Learned disc above fusion is collapsing. She reports anxiety in response to "the unknown." Increased worry about her health and implications for the future. Stress in response to not being able to do tasks that she has always done. She reports, "there have been a couple of times I have felt like I had trouble getting in my breath" and is unsure if this is related to anxiety or physical issues. Notices "nervous energy."   Noticed she "got quiet and too my self" with some sad mood after conversation earlier this week. Has sadness in response to health issues, such as not getting a good report. Motivation has been lower. Energy has been low. Difficulty getting out of bed. She reports poor sleep and has not yet tried Trazodone. Has been having to take Methylphenidate more often in the last month due to increased difficulty with concentration (ie., taking 1 tab as prescribed instead of 1/2 tablet). She reports that at times she is not eating well (enough qty or healthy foods). Some diminished interest in things. May be procrastinating with some tasks or avoiding certain things. Denies SI.   She is working light duty. Reports that she wants to work.  Past Psychiatric Medication Trials: Lexapro Wellbutrin SR Wellbutrin XL Diazepam- Took x 1. Ritalin Methylphenidate ER  Flowsheet Row Admission (Discharged) from 09/07/2020 in [facility] [facility] from 09/02/2020 in [facility] PREADMISSION TESTING Pre-Admission Testing 58  from 08/04/2020 in [facility] PREADMISSION TESTING  C-SSRS RISK CATEGORY No Risk No Risk No Risk        Review of Systems:  Review of Systems  Musculoskeletal:  Positive for back pain and neck pain. Negative for gait problem.  Neurological:  Negative for tremors.       Occ dizziness with pain  Psychiatric/Behavioral:         Please refer to HPI    Medications: I have reviewed the patient's current medications.  Current Outpatient Medications  Medication Sig Dispense Refill   meloxicam (MOBIC) 7.5 MG tablet Take 1 tablet (7.5 mg total) by mouth twice daily 60 tablet 1   tiZANidine (ZANAFLEX) 4 MG tablet TAKE 1 TABLET BY MOUTH 3 TIMES DAILY AS NEEDED 30 tablet 0   tiZANidine (ZANAFLEX) 4 MG tablet Take 1 tablet by mouth 3 times daily as needed 30 tablet 0   traMADol (ULTRAM) 50 MG tablet Take 50 mg by mouth every 6 (six) hours as needed for moderate pain.     diclofenac Sodium (VOLTAREN) 1 % GEL Apply 2 g topically 3 (three) times daily as needed for pain. (Patient not taking: Reported on 10/27/2020)     diclofenac Sodium (VOLTAREN) 1 % GEL APPLY A SMALL AMOUNT TO AFFECTED AREA THREE TIMES A DAY AS NEEDED FOR PAIN (Patient not taking: Reported on 10/27/2020) 100 g 0   escitalopram (LEXAPRO) 5 MG/5ML solution Take 20 mLs (20 mg total) by mouth daily. 1800 mL 0   lactase (LACTAID) 3000 UNITS tablet Take 9,000  Units by mouth 3 (three) times daily with meals.     meloxicam (MOBIC) 7.5 MG tablet take 1 tablet by mouth every other day 60 tablet 1   methocarbamol (ROBAXIN) 500 MG tablet TAKE 1 TABLET BY MOUTH 4 TIMES DAILY. (Patient not taking: Reported on 10/27/2020) 45 tablet 0   methylphenidate (RITALIN) 5 MG tablet Take 1 tablet (5 mg total) by mouth 3 (three) times daily. 270 tablet 0   methylPREDNISolone (MEDROL) 4 MG tablet TAKE 6 TABS BY MOUTH ON DAY 1; 5 TABS ON DAY 2; 4 TABS ON DAY 3; 3 TABS ON DAY 4; 2 TABS ON DAY 5; 1 TAB ON DAY 6 THEN STOP (Patient not taking: Reported  on 10/27/2020) 21 tablet 0   ondansetron (ZOFRAN) 4 MG tablet TAKE 2 TABLETS BY MOUTH 2 TIMES DAILY (Patient not taking: Reported on 04/28/2021) 20 tablet 0   ondansetron (ZOFRAN-ODT) 4 MG disintegrating tablet DISSOLVE 1 TABLET BY MOUTH 3 TIMES DAILY AS NEEDED FOR NAUSEA (Patient not taking: Reported on 04/28/2021) 15 tablet 0   tiZANidine (ZANAFLEX) 4 MG tablet Take 1 tablet (4 mg total) by mouth 3 (three) times daily as needed. 30 tablet 0   traMADol (ULTRAM) 50 MG tablet Take 1 tablet by mouth every 6 hours as needed 40 tablet 1   traMADol (ULTRAM) 50 MG tablet Take 1 tablet by mouth every 6 hours as needed 40 tablet 1   traZODone (DESYREL) 50 MG tablet Take 1/2 - 2 tablets by mouth at bedtime as needed 30 tablet 0   No current facility-administered medications for this visit.    Medication Side Effects: None  Allergies:  Allergies  Allergen Reactions   Meperidine Hcl Other (See Comments)    Severe headache   Lactose Intolerance (Gi)     Upset stomach    Codeine Nausea Only    Past Medical History:  Diagnosis Date   Anxiety    Attention deficit disorder (ADD)    Complication of anesthesia    Depression    Dysrhythmia    PACs- after epidural steroid injection in neck   Family history of adverse reaction to anesthesia    Mother, PONV and "difficult to wake up"   GERD (gastroesophageal reflux disease)    History of kidney stones    MVP (mitral valve prolapse)    diagnosed at 58. Per notes from Dr. Cathlean Cower in 2011, "no 2D echo evidence"   Nerve pain    per patient in bilateral hands   PONV (postoperative nausea and vomiting)    Sleep apnea     Past Medical History, Surgical history, Social history, and Family history were reviewed and updated as appropriate.   Please see review of systems for further details on the patient's review from today.   Objective:   Physical Exam:  BP 125/77   Pulse 87   LMP  (LMP Unknown)   Physical Exam Constitutional:       General: She is not in acute distress. Musculoskeletal:        General: No deformity.  Neurological:     Mental Status: She is alert and oriented to person, place, and time.     Coordination: Coordination normal.  Psychiatric:        Attention and Perception: Attention and perception normal. She does not perceive auditory or visual hallucinations.        Mood and Affect: Mood is anxious and depressed. Affect is not labile, blunt, angry or inappropriate.  Speech: Speech normal.        Behavior: Behavior normal. Behavior is cooperative.        Thought Content: Thought content normal. Thought content is not paranoid or delusional. Thought content does not include homicidal or suicidal ideation. Thought content does not include homicidal or suicidal plan.        Cognition and Memory: Cognition and memory normal.        Judgment: Judgment normal.     Comments: Insight intact    Lab Review:     Component Value Date/Time   NA 141 10/30/2020 1136   K 3.9 10/30/2020 1136   CL 104 10/30/2020 1136   CO2 27 10/30/2020 1116   GLUCOSE 103 (H) 10/30/2020 1136   BUN 9 10/30/2020 1136   CREATININE 0.80 10/30/2020 1136   CALCIUM 9.4 10/30/2020 1116   PROT 7.0 10/30/2020 1116   ALBUMIN 4.2 10/30/2020 1116   AST 29 10/30/2020 1116   ALT 29 10/30/2020 1116   ALKPHOS 84 10/30/2020 1116   BILITOT 0.6 10/30/2020 1116   GFRNONAA >60 10/30/2020 1116       Component Value Date/Time   WBC 5.2 10/30/2020 1116   RBC 4.85 10/30/2020 1116   HGB 15.0 10/30/2020 1136   HCT 44.0 10/30/2020 1136   PLT 294 10/30/2020 1116   MCV 91.5 10/30/2020 1116   MCH 29.7 10/30/2020 1116   MCHC 32.4 10/30/2020 1116   RDW 12.7 10/30/2020 1116   LYMPHSABS 1.7 10/30/2020 1116   MONOABS 0.4 10/30/2020 1116   EOSABS 0.1 10/30/2020 1116   BASOSABS 0.1 10/30/2020 1116    No results found for: POCLITH, LITHIUM   No results found for: PHENYTOIN, PHENOBARB, VALPROATE, CBMZ   .res Assessment: Plan:      Patient seen for 30 minutes and time spent discussing recent worsening in mood and anxiety signs and symptoms in response to increased physical pain and limitations from injury and receiving news that injury is more extensive.  Discussed that she is currently taking the maximum approved dose of Lexapro and treatment options would be to either change Lexapro or add medication for augmentation.  She reports some apprehension about starting a new medication for augmentation of anxiety or depression at this time due to history of of adverse effects with multiple medications and also possibly starting new treatments for medical conditions.  Discussed trying trazodone to improve sleep on a night when she is not scheduled to work the following day in case she experiences excessive somnolence.  Discussed that improved sleep may be helpful for her mood, anxiety, energy, and concentration.   Recommend continuing Lexapro 20 mg daily for depression and anxiety. Recommend continuing methylphenidate 5 mg up to 3 times daily for attention deficit disorder. Patient to follow-up with this provider in 3 months or sooner if clinically indicated. Recommend continuing therapy with Lina Sayre, [facility]  Patient advised to contact office with any questions, adverse effects, or acute worsening in signs and symptoms.   Lillyan was seen today for anxiety, insomnia and adhd.  Diagnoses and all orders for this visit:  Adjustment disorder with mixed anxiety and depressed mood  Post traumatic stress disorder (PTSD) -     escitalopram (LEXAPRO) 5 MG/5ML solution; Take 20 mLs (20 mg total) by mouth daily.  ADHD, predominantly inattentive type -     methylphenidate (RITALIN) 5 MG tablet; Take 1 tablet (5 mg total) by mouth 3 (three) times daily.  Generalized anxiety disorder  Mixed obsessional thoughts and acts  Insomnia, unspecified type    Please see After Visit Summary for patient specific instructions.  Future  Appointments  Date Time Provider [place]  05/04/2021  3:00 PM Lina Sayre, [facility] CP-CP None  05/18/2021  3:00 PM Lina Sayre, [facility] CP-CP None  06/08/2021  2:00 PM Lina Sayre, [facility] CP-CP None  06/22/2021  3:00 PM Lina Sayre, [facility] CP-CP None  07/13/2021  3:00 PM Lina Sayre, [facility] CP-CP None  07/29/2021 10:00 AM [name], PMHNP CP-CP None    No orders of the defined types were placed in this encounter.   -------------------------------

## 2021-05-04 ENCOUNTER — Ambulatory Visit: Payer: No Typology Code available for payment source | Admitting: Psychiatry

## 2021-05-06 ENCOUNTER — Other Ambulatory Visit (HOSPITAL_COMMUNITY): Payer: Self-pay

## 2021-05-18 ENCOUNTER — Other Ambulatory Visit: Payer: Self-pay

## 2021-05-18 ENCOUNTER — Ambulatory Visit (INDEPENDENT_AMBULATORY_CARE_PROVIDER_SITE_OTHER): Payer: No Typology Code available for payment source | Admitting: Psychiatry

## 2021-05-18 ENCOUNTER — Other Ambulatory Visit (HOSPITAL_COMMUNITY): Payer: Self-pay

## 2021-05-18 DIAGNOSIS — F4310 Post-traumatic stress disorder, unspecified: Secondary | ICD-10-CM

## 2021-05-18 NOTE — Progress Notes (Signed)
      [facility] Counselor/Therapist Progress Note  Patient ID: Shannon Swanson, MRN: [id number],    Date: 05/18/2021  Time Spent: 56 minutes start time 3:06 PM end time 4:02  Treatment Type: Individual Therapy  Reported Symptoms: anxiety, chronic pain, sadness, sleep issues, triggered responses, obsessive thinking,fatigue  Mental Status Exam:  Appearance:   Well Groomed     Behavior:  Appropriate  Motor:  Normal  Speech/Language:   Normal Rate  Affect:  Appropriate  Mood:  anxious  Thought process:  normal  Thought content:    WNL  Sensory/Perceptual disturbances:    WNL  Orientation:  oriented to person, place, time/date, and situation  Attention:  Good  Concentration:  Good  Memory:  WNL  Fund of knowledge:   Good  Insight:    Good  Judgment:   Good  Impulse Control:  Good   Risk Assessment: Danger to Self:  No Self-injurious Behavior: No Danger to Others: No Duty to Warn:no Physical Aggression / Violence:No  Access to Firearms a concern: No  Gang Involvement:No   Subjective: Patient was present for session.  She shared that she is continuing to deal with her neck issues and chronic pain.  Explained she is very overwhelmed with the fear of the unknown and she has noticed it is leading to some depression.  Did processing set on that issue suds level 10, negative cognition "I am trapped" felt anxiety sadness and fear and unsettledness in her core.  Patient was able to reduce suds level to 4.  Discussed things that can ground her and ways to talk herself through continuing to keep persevering through this extremely difficult situation.  Interventions: Cognitive Behavioral Therapy, Solution-Oriented/Positive Psychology, and Eye Movement Desensitization and Reprocessing (EMDR)  Diagnosis:   ICD-10-CM   1. Post traumatic stress disorder (PTSD)  F43.10       Plan: Patient is to use CBT and coping skills to decrease triggered responses.  Patient is to practice  utilizing her self talk and grounding tools to help her continue and persevere through this difficult situation.  Patient is to continue working with her medical providers on her physical concerns and [name] p.m. HNP on medication.  Patient is to take medication as directed Long-term goal: Recall the traumatic event without becoming overwhelming negative emotions Short-term goal: Practice implement relaxation training as a coping mechanism for tension panic stress anger and anxiety  Lina Sayre, [facility]

## 2021-05-20 ENCOUNTER — Other Ambulatory Visit (HOSPITAL_COMMUNITY): Payer: Self-pay

## 2021-05-20 MED ORDER — CHLORHEXIDINE GLUCONATE 0.12 % MT SOLN
OROMUCOSAL | 0 refills | Status: DC
  Filled 2021-05-20: qty 946, 34d supply, fill #0

## 2021-06-08 ENCOUNTER — Ambulatory Visit (INDEPENDENT_AMBULATORY_CARE_PROVIDER_SITE_OTHER): Payer: No Typology Code available for payment source | Admitting: Psychiatry

## 2021-06-08 ENCOUNTER — Other Ambulatory Visit: Payer: Self-pay

## 2021-06-08 DIAGNOSIS — F4310 Post-traumatic stress disorder, unspecified: Secondary | ICD-10-CM

## 2021-06-08 NOTE — Progress Notes (Signed)
      [facility] Counselor/Therapist Progress Note  Patient ID: Shannon Swanson, MRN: [id number],    Date: 06/08/2021  Time Spent: 54 minutes start time 2:08 PM end time 3:02 PM  Treatment Type: Individual Therapy  Reported Symptoms: anxiety, depression, upset moments, triggered responses, hypervigilance,chronic pain ,fatigue, labile, focusing issues  Mental Status Exam:  Appearance:   Casual and Neat     Behavior:  Appropriate  Motor:  Normal  Speech/Language:   Normal Rate  Affect:  Appropriate  Mood:  anxious and sad  Thought process:  normal  Thought content:    WNL  Sensory/Perceptual disturbances:    Noticeable pain in neck, patient had to readjust to do processing  Orientation:  oriented to person, place, time/date, and situation  Attention:  Good  Concentration:  Good  Memory:  WNL  Fund of knowledge:   Good  Insight:    Good  Judgment:   Good  Impulse Control:  Good   Risk Assessment: Danger to Self:  No Self-injurious Behavior: No Danger to Others: No Duty to Warn:no Physical Aggression / Violence:No  Access to Firearms a concern: No  Gang Involvement:No   Subjective: Patient was present for session. She shared that she is continue to have different issues related to her PTSD and the injuries associated to the incident.  She is still working with her doctor on the issues.She went on to share she is having days when she just doesn't want to do anything and she recognizes that is her depression and it is not healthy for her.  Patient did processing set on not being able to do what she used to do because of the incident, suds level 10, negative cognition "I am broken" felt hopeless and sadness in her chest.  Patient was able to reduce suds level to 4.  Encourage patient to focus on things that she can do and to recognize that even though she is very limited she can still impact people's lives positively.  Interventions: Solution-Oriented/Positive Psychology, Eye  Movement Desensitization and Reprocessing (EMDR), and Insight-Oriented  Diagnosis:   ICD-10-CM   1. Post traumatic stress disorder (PTSD)  F43.10       Plan: Patient is to use coping skills to decrease triggered responses.  Patient is to work on finding ways that she can release negative emotions appropriately.  Patient is to focus on things that she can do that are simple acts of kindness for others.  Patient is to take medication as directed Long-term goal: Recall the traumatic event without becoming overwhelming negative emotions Short-term goal: Practice implement relaxation training as a coping mechanism for tension panic stress anger and anxiety  Lina Sayre, [facility]

## 2021-06-21 ENCOUNTER — Other Ambulatory Visit: Payer: Self-pay | Admitting: Neurosurgery

## 2021-06-21 ENCOUNTER — Other Ambulatory Visit (HOSPITAL_COMMUNITY): Payer: Self-pay | Admitting: Neurosurgery

## 2021-06-21 DIAGNOSIS — M542 Cervicalgia: Secondary | ICD-10-CM

## 2021-06-22 ENCOUNTER — Other Ambulatory Visit: Payer: Self-pay

## 2021-06-22 ENCOUNTER — Ambulatory Visit (INDEPENDENT_AMBULATORY_CARE_PROVIDER_SITE_OTHER): Payer: No Typology Code available for payment source | Admitting: Psychiatry

## 2021-06-22 DIAGNOSIS — F4310 Post-traumatic stress disorder, unspecified: Secondary | ICD-10-CM | POA: Diagnosis not present

## 2021-06-22 NOTE — Progress Notes (Signed)
°      [facility] Counselor/Therapist Progress Note  Patient ID: HANNIA MATCHETT, MRN: [id number],    Date: 06/22/2021  Time Spent: 51 minutes start time 3:07 PM end time 3:58 PM  Treatment Type: Individual Therapy  Reported Symptoms: anxiety, sadness, sleep issues, fatigue, triggered responses, chronic pain, headaches, focusing issues  Mental Status Exam:  Appearance:   Well Groomed     Behavior:  Appropriate  Motor:  Normal  Speech/Language:   Normal Rate  Affect:  Appropriate and Tearful  Mood:  anxious and sad  Thought process:  normal  Thought content:    WNL  Sensory/Perceptual disturbances:    WNL  Orientation:  oriented to person, place, time/date, and situation  Attention:  Good  Concentration:  Good  Memory:  WNL  Fund of knowledge:   Good  Insight:    Good  Judgment:   Good  Impulse Control:  Good   Risk Assessment: Danger to Self:  No Self-injurious Behavior: No Danger to Others: No Duty to Warn:no Physical Aggression / Violence:No  Access to Firearms a concern: No  Gang Involvement:No   Subjective: Patient was present for session.  She shared that her mother was in the emergency room and she tried to avoid the peds side due to not wanting to get triggered while she was there. She shared she is still getting anxious about work issues since she isn't able to work like she wants to. She shared that is leaving her with financial issues.  Patient stated that she is really struggling with not being able to do simple things that she used to do in the past.  Did processing set on that issue, suds level 8, negative cognition "I am broken" felt fear frustration and hurt in her neck and toes.  Patient was able to reduce suds level to 5.  Through the processing she was encouraged to focus on the things that she can still do even though she is very limited currently.  Patient was able to realize that some things she can still do it would just take much longer and she will  have to do it differently and she will have to be very careful as she is doing those things in the future.  Patient was reminded to focus on the things that she can control fix and change.  Interventions: Solution-Oriented/Positive Psychology, Eye Movement Desensitization and Reprocessing (EMDR), and Insight-Oriented  Diagnosis:   ICD-10-CM   1. Post traumatic stress disorder (PTSD)  F43.10       Plan: Patient is to use CBT and coping skills to decrease negative emotions.  Patient is to focus on the things that she can control fix and change.  Patient is to work on reminding of herself of the things she can do and do those things.  Patient is to continue working with physicians on medical issues.  Patient is to take medication as directed. Long-term goal: Recall the traumatic event without becoming overwhelming negative emotions Short-term goal: Practice implement relaxation training as a coping mechanism for tension panic stress anger and anxiety    Lina Sayre, [facility]

## 2021-06-29 ENCOUNTER — Other Ambulatory Visit: Payer: Self-pay

## 2021-06-29 ENCOUNTER — Ambulatory Visit (HOSPITAL_BASED_OUTPATIENT_CLINIC_OR_DEPARTMENT_OTHER)
Admission: RE | Admit: 2021-06-29 | Discharge: 2021-06-29 | Disposition: A | Discharge status: Home / Self Care | Payer: PRIVATE HEALTH INSURANCE | Source: Ambulatory Visit | Attending: Neurosurgery | Admitting: Neurosurgery

## 2021-06-29 ENCOUNTER — Encounter (HOSPITAL_BASED_OUTPATIENT_CLINIC_OR_DEPARTMENT_OTHER): Payer: Self-pay

## 2021-06-29 DIAGNOSIS — M542 Cervicalgia: Secondary | ICD-10-CM | POA: Insufficient documentation

## 2021-07-01 ENCOUNTER — Other Ambulatory Visit (HOSPITAL_COMMUNITY): Payer: Self-pay

## 2021-07-01 MED ORDER — METHYLPREDNISOLONE 4 MG PO TBPK
ORAL_TABLET | ORAL | 0 refills | Status: DC
  Filled 2021-07-01: qty 21, 6d supply, fill #0

## 2021-07-13 ENCOUNTER — Ambulatory Visit (INDEPENDENT_AMBULATORY_CARE_PROVIDER_SITE_OTHER): Payer: No Typology Code available for payment source | Admitting: Psychiatry

## 2021-07-13 ENCOUNTER — Other Ambulatory Visit: Payer: Self-pay

## 2021-07-13 DIAGNOSIS — F4310 Post-traumatic stress disorder, unspecified: Secondary | ICD-10-CM | POA: Diagnosis not present

## 2021-07-13 NOTE — Progress Notes (Signed)
°      [facility] Counselor/Therapist Progress Note  Patient ID: Shannon Swanson, MRN: [id number],    Date: 07/13/2021  Time Spent: 50 minutes start time 3:09 PM end time 3:59 PM  Treatment Type: Individual Therapy  Reported Symptoms: anxiety, sadness, triggered responses, hypervigilance, startle responses,sleep issues, fatigue, loss of appetite, focusing issues, chronic pain issues  Mental Status Exam:  Appearance:   Well Groomed     Behavior:  Appropriate  Motor:  Normal  Speech/Language:   Normal Rate  Affect:  Appropriate  Mood:  anxious  Thought process:  normal  Thought content:    WNL  Sensory/Perceptual disturbances:    Dizzy spell in session due to way she turned her neck  Orientation:  oriented to person, place, time/date, and situation  Attention:  Good  Concentration:  Good  Memory:  WNL  Fund of knowledge:   Good  Insight:    Good  Judgment:   Good  Impulse Control:  Good   Risk Assessment: Danger to Self:  No Self-injurious Behavior: No Danger to Others: No Duty to Warn:no Physical Aggression / Violence:No  Access to Firearms a concern: No  Gang Involvement:No   Subjective: Patient was present for session.  She shared that she has been struggling with Chronic pain issues tied to the incident but she started a new medication that is helping her straighten her finger and she is hopeful that will help. 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 Discussed concerns with patient.  Patient did processing set of going to Peds behavioral health ED, suds level 5, negative cognition "it is not safe" felt fear in her chest.  Patient was able to reduce suds level to 3.  Discussed different visuals to use to help her when she is in the location of the incident.  Patient shared that she is still very frustrated due to not being able to do everything she would like to physically.  Discussed the possibility of working with an occupational therapist to see  if they would be able to figure out different ways that patient might be able to accomplish some daily functioning chores within her restrictions.  Interventions: Solution-Oriented/Positive Psychology, Eye Movement Desensitization and Reprocessing (EMDR), and Insight-Oriented  Diagnosis:   ICD-10-CM   1. Post traumatic stress disorder (PTSD)  F43.10       Plan: Patient is to use CBT and coping skills to decrease triggered responses.  Patient is to practice visuals to help calm her if she goes to the pediatric ED.  Patient is to focus on taking things 1 step at a time and working on what she can do.  Patient is to take medication as directed. Long-term goal: Recall the traumatic event without becoming overwhelming negative emotions Short-term goal: Practice implement relaxation training as a coping mechanism for tension panic stress anger and anxiety  Lina Sayre, [facility]

## 2021-07-22 ENCOUNTER — Other Ambulatory Visit (HOSPITAL_COMMUNITY): Payer: Self-pay

## 2021-07-23 ENCOUNTER — Other Ambulatory Visit (HOSPITAL_COMMUNITY): Payer: Self-pay

## 2021-07-26 ENCOUNTER — Other Ambulatory Visit (HOSPITAL_COMMUNITY): Payer: Self-pay

## 2021-07-29 ENCOUNTER — Ambulatory Visit: Payer: No Typology Code available for payment source | Admitting: Psychiatry

## 2021-08-02 ENCOUNTER — Other Ambulatory Visit: Payer: Self-pay

## 2021-08-02 ENCOUNTER — Ambulatory Visit (INDEPENDENT_AMBULATORY_CARE_PROVIDER_SITE_OTHER): Payer: No Typology Code available for payment source | Admitting: Psychiatry

## 2021-08-02 DIAGNOSIS — F4310 Post-traumatic stress disorder, unspecified: Secondary | ICD-10-CM | POA: Diagnosis not present
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
## 2021-08-23 ENCOUNTER — Other Ambulatory Visit: Payer: Self-pay

## 2021-08-23 ENCOUNTER — Ambulatory Visit (INDEPENDENT_AMBULATORY_CARE_PROVIDER_SITE_OTHER): Payer: No Typology Code available for payment source | Admitting: Psychiatry

## 2021-08-23 DIAGNOSIS — F4310 Post-traumatic stress disorder, unspecified: Secondary | ICD-10-CM

## 2021-08-23 NOTE — Progress Notes (Signed)
°      [facility] Counselor/Therapist Progress Note  Patient ID: Shannon Swanson, MRN: [id number],    Date: 08/23/2021  Time Spent: 51 minutes start time 3:05 PM end time 3:56 PM  Treatment Type: Individual Therapy  Reported Symptoms: chronic pain issues, sadness, anxiety, triggered responses, flashbacks, panic   Mental Status Exam:  Appearance:   Well Groomed     Behavior:  Appropriate  Motor:  Normal  Speech/Language:   Normal Rate  Affect:  Appropriate  Mood:  anxious  Thought process:  normal  Thought content:    WNL  Sensory/Perceptual disturbances:    WNL  Orientation:  oriented to person, place, time/date, and situation  Attention:  Good  Concentration:  Good  Memory:  WNL  Fund of knowledge:   Good  Insight:    Good  Judgment:   Good  Impulse Control:  Good   Risk Assessment: Danger to Self:  No Self-injurious Behavior: No Danger to Others: No Duty to Warn:no Physical Aggression / Violence:No  Access to Firearms a concern: No  Gang Involvement:No   Subjective: Patient was present for session.  She shared that she is still having pain issues in her neck and that is concerning her due to her surgeon still feels there are issues and may need another surgery.  She is still finding herself triggered by behavioral health patients but she is talking herself through it as best she can.  Discussed different filters and ways to manage some of the anxiety/panic she feels when she is in the situation with the potential behavioral health patient.  Patient was encouraged to feel positive about what she has used and how it has been helpful so far.  Did share that she is thankful to be back with her peers and that has been a good transition.  She reported feeling frustrated because she cannot do what she is used to doing.  Patient stated she is still feeling lots of anxiety about her medical condition.  Encouraged patient to try to take things 1 step at a time and focus on what she  can do so she does not get so overwhelmed.  Interventions: Cognitive Behavioral Therapy and Solution-Oriented/Positive Psychology  Diagnosis:   ICD-10-CM   1. Post traumatic stress disorder (PTSD)  F43.10       Plan:  Patient is to use CBT and coping skills to decrease triggered responses.  Patient is to continue avoiding behavioral health patients when she is in the emergency room as much is possible.  Patient is to continue working with providers on her medical issues.  Patient is to take medication as directed Long-term goal: Recall the traumatic event without becoming overwhelming negative emotions Short-term goal: Practice implement relaxation training as a coping mechanism for tension panic stress anger and anxiety  Lina Sayre, [facility]

## 2021-09-01 ENCOUNTER — Encounter: Payer: Self-pay | Admitting: Psychiatry

## 2021-09-01 ENCOUNTER — Ambulatory Visit: Payer: No Typology Code available for payment source | Admitting: Psychiatry

## 2021-09-01 ENCOUNTER — Other Ambulatory Visit (HOSPITAL_COMMUNITY): Payer: Self-pay

## 2021-09-01 ENCOUNTER — Other Ambulatory Visit: Payer: Self-pay

## 2021-09-01 DIAGNOSIS — F322 Major depressive disorder, single episode, severe without psychotic features: Secondary | ICD-10-CM

## 2021-09-01 DIAGNOSIS — F4310 Post-traumatic stress disorder, unspecified: Secondary | ICD-10-CM

## 2021-09-01 MED ORDER — REXULTI 0.5 MG PO TABS: 0.2500 mg | ORAL_TABLET | Freq: Every day | ORAL | 0 refills | Status: DC

## 2021-09-01 MED ORDER — BREXPIPRAZOLE 0.25 MG PO TABS
0.2500 mg | ORAL_TABLET | Freq: Every day | ORAL | 1 refills | Status: DC
  Filled 2021-09-01 – 2021-11-10 (×3): qty 30, 30d supply, fill #0
  Filled 2021-12-03: qty 30, 30d supply, fill #1

## 2021-09-01 MED ORDER — ESCITALOPRAM OXALATE 5 MG/5ML PO SOLN
20.0000 mg | Freq: Every day | ORAL | 0 refills | Status: DC
  Filled 2021-09-01 – 2021-12-03 (×2): qty 1800, 90d supply, fill #0
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
## 2021-09-02 ENCOUNTER — Other Ambulatory Visit (HOSPITAL_COMMUNITY): Payer: Self-pay

## 2021-09-10 ENCOUNTER — Other Ambulatory Visit (HOSPITAL_BASED_OUTPATIENT_CLINIC_OR_DEPARTMENT_OTHER): Payer: Self-pay | Admitting: Orthopedic Surgery

## 2021-09-10 ENCOUNTER — Other Ambulatory Visit: Payer: Self-pay | Admitting: Orthopedic Surgery

## 2021-09-10 DIAGNOSIS — M25511 Pain in right shoulder: Secondary | ICD-10-CM

## 2021-09-13 ENCOUNTER — Ambulatory Visit: Payer: No Typology Code available for payment source | Admitting: Psychiatry

## 2021-09-13 ENCOUNTER — Other Ambulatory Visit (HOSPITAL_COMMUNITY): Payer: Self-pay

## 2021-09-16 ENCOUNTER — Other Ambulatory Visit: Payer: Self-pay

## 2021-09-16 ENCOUNTER — Ambulatory Visit (HOSPITAL_BASED_OUTPATIENT_CLINIC_OR_DEPARTMENT_OTHER)
Admission: RE | Admit: 2021-09-16 | Discharge: 2021-09-16 | Disposition: A | Discharge status: Home / Self Care | Payer: PRIVATE HEALTH INSURANCE | Source: Ambulatory Visit | Attending: Orthopedic Surgery | Admitting: Orthopedic Surgery

## 2021-09-16 DIAGNOSIS — M25511 Pain in right shoulder: Secondary | ICD-10-CM | POA: Diagnosis present

## 2021-09-16 DIAGNOSIS — M67813 Other specified disorders of tendon, right shoulder: Secondary | ICD-10-CM | POA: Diagnosis not present

## 2021-09-16 DIAGNOSIS — M75111 Incomplete rotator cuff tear or rupture of right shoulder, not specified as traumatic: Secondary | ICD-10-CM | POA: Diagnosis not present

## 2021-09-17 ENCOUNTER — Ambulatory Visit (HOSPITAL_BASED_OUTPATIENT_CLINIC_OR_DEPARTMENT_OTHER): Payer: PRIVATE HEALTH INSURANCE

## 2021-09-23 ENCOUNTER — Other Ambulatory Visit (HOSPITAL_COMMUNITY): Payer: Self-pay

## 2021-09-23 MED ORDER — MELOXICAM 7.5 MG PO TABS
ORAL_TABLET | ORAL | 0 refills | Status: DC
  Filled 2021-09-23: qty 60, 30d supply, fill #0

## 2021-09-23 MED ORDER — TIZANIDINE HCL 4 MG PO TABS
ORAL_TABLET | ORAL | 0 refills | Status: DC
  Filled 2021-09-23: qty 30, 30d supply, fill #0

## 2021-09-23 MED ORDER — TRAMADOL HCL 50 MG PO TABS
ORAL_TABLET | ORAL | 0 refills | Status: DC
  Filled 2021-09-23: qty 40, 10d supply, fill #0

## 2021-09-24 ENCOUNTER — Other Ambulatory Visit (HOSPITAL_COMMUNITY): Payer: Self-pay

## 2021-10-04 ENCOUNTER — Other Ambulatory Visit (HOSPITAL_COMMUNITY): Payer: Self-pay

## 2021-10-04 ENCOUNTER — Other Ambulatory Visit: Payer: Self-pay

## 2021-10-04 ENCOUNTER — Ambulatory Visit (INDEPENDENT_AMBULATORY_CARE_PROVIDER_SITE_OTHER): Payer: No Typology Code available for payment source | Admitting: Psychiatry

## 2021-10-04 DIAGNOSIS — F4310 Post-traumatic stress disorder, unspecified: Secondary | ICD-10-CM | POA: Diagnosis not present

## 2021-10-04 NOTE — Progress Notes (Signed)
?      [facility] Counselor/Therapist Progress Note ? ?Patient ID: Shannon Swanson, MRN: [id number],   ? ?Date: 10/04/2021 ? ?Time Spent: 60 minutes start time 3:07 PM end time 4:07 ? ?Treatment Type: Individual Therapy ? ?Reported Symptoms: anxiety, pain, triggered responses, panic, hypervigilance, nightmares, sleep issues ? ?Mental Status Exam: ? ?Appearance:   Well Groomed     ?Behavior:  Appropriate  ?Motor:  Normal  ?Speech/Language:   Normal Rate  ?Affect:  Appropriate tearful  ?Mood:  anxious sad  ?Thought process:  normal  ?Thought content:    WNL  ?Sensory/Perceptual disturbances:    pain  ?Orientation:  oriented to person, place, time/date, and situation  ?Attention:  Good  ?Concentration:  Good  ?Memory:  WNL  ?Fund of knowledge:   Good  ?Insight:    Good  ?Judgment:   Good  ?Impulse Control:  Good  ? ?Risk Assessment: ?Danger to Self:  No ?Self-injurious Behavior: No ?Danger to Others: No ?Duty to Warn:no ?Physical Aggression / Violence:No  ?Access to Firearms a concern: No  ?Gang Involvement:No  ? ?Subjective: Patient was present for session. She shared that a behavioral health patient came in her area and that triggered her greatly.  She shared that she was very uncomfortable and caused some panic for her. She shared that she had started Rexulti but isn't sure if it is right for her. Encouraged contact with her provider [name] PMHNP about that issue.  She shared she feels overall she is doing better with things but when she gets triggered it feels like a panic.  Patient shared that she is continuing to have issues with both her shoulder and her neck and she is trying to figure out how to manage the pain appropriately.  Patient was encouraged to try and think through the things that she can do even with the issues that she is having.  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  Patient was encouraged to recognize that she cannot take any chances or risks with her physical limitations and interacting with the behavioral health patterns will increase anxiety which will not be good for either patient or the patient's she is working with.  Patient was encouraged to just continue taking things 1 step at a time and see what she is physically able to accomplish and continue trying to work through the PTSD from the incident that had occurred at work. ? ?Interventions: Cognitive Behavioral Therapy and Solution-Oriented/Positive Psychology ? ?Diagnosis: ?  ICD-10-CM   ?1. Post traumatic stress disorder (PTSD)  F43.10   ?  ? ? ?Plan:  Patient is to use CBT and coping skills to decrease triggered responses.  Patient is to continue avoiding behavioral health patients when she is in the emergency room as much is possible.  Patient is to continue working with providers on her medical issues.  Patient is to take medication as directed ?Long-term goal: Recall the traumatic event without becoming overwhelming negative emotions ?Short-term goal: Practice implement relaxation training as a coping mechanism for tension panic stress anger and anxiety ? ?Shannon Swanson, [facility] ? ? ? ? ? ? ? ? ? ? ? ? ? ? ? ? ? ? ?

## 2021-10-13 ENCOUNTER — Other Ambulatory Visit (HOSPITAL_COMMUNITY): Payer: Self-pay

## 2021-10-18 ENCOUNTER — Ambulatory Visit (INDEPENDENT_AMBULATORY_CARE_PROVIDER_SITE_OTHER): Payer: No Typology Code available for payment source | Admitting: Psychiatry

## 2021-10-18 DIAGNOSIS — F4310 Post-traumatic stress disorder, unspecified: Secondary | ICD-10-CM | POA: Diagnosis not present

## 2021-10-18 NOTE — Progress Notes (Signed)
?      [facility] Counselor/Therapist Progress Note ? ?Patient ID: Shannon Swanson, MRN: [id number],   ? ?Date: 10/18/2021 ? ?Time Spent: 50 minutes start time 3:12 PM end time 4:02 PM ? ?Treatment Type: Individual Therapy ? ?Reported Symptoms: anxiety, chronic pain, focusing issues, sadness, panic, triggered response ? ?Mental Status Exam: ? ?Appearance:   Well Groomed     ?Behavior:  Appropriate  ?Motor:  Normal  ?Speech/Language:   Normal Rate  ?Affect:  Appropriate  ?Mood:  anxious  ?Thought process:  normal  ?Thought content:    WNL  ?Sensory/Perceptual disturbances:    Headache, pain  ?Orientation:  oriented to person, place, time/date, and situation  ?Attention:  Good  ?Concentration:  Good  ?Memory:  WNL  ?Fund of knowledge:   Good  ?Insight:    Good  ?Judgment:   Good  ?Impulse Control:  Good  ? ?Risk Assessment: ?Danger to Self:  No ?Self-injurious Behavior: No ?Danger to Others: No ?Duty to Warn:no ?Physical Aggression / Violence:No  ?Access to Firearms a concern: No  ?Gang Involvement:No  ? ?Subjective: Patient was present for session. She shared her PCP took her out of work again due to the issues from the incident.  Patient stated it has been hard even though she knows she needed to try and let her muscles relax. She shared she has gotten triggered still but it has not been as much with not being at work.  Reported feeling extremely depressed and overwhelmed over not being able to work.  Did processing set on that issue, suds level 10, negative cognition "I am a disappointment" felt anger and sadness in her shoulders and head.  Patient was only able to reduce suds level to 6.  She was encouraged to think through things that she still can do so that she does not get so focused on what she cannot do.  Different strategies to help her with her thoughts were discussed in session. ? ?Interventions: Cognitive Behavioral Therapy and Solution-Oriented/Positive Psychology ? ?Diagnosis: ?  ICD-10-CM   ?1. Post  traumatic stress disorder (PTSD)  F43.10   ?  ? ? ?Plan: Patient is to use CBT and coping skills to decrease triggered responses.  Patient is to continue avoiding behavioral health patients when she is in the emergency room as much is possible.  Patient is to work on reminding herself that she is enough and still has a purpose even when she is unable to work.  She is to try and focus on concrete things that she can still do.  Patient is to continue working with providers on her medical issues.  Patient is to take medication as directed ?Long-term goal: Recall the traumatic event without becoming overwhelming negative emotions ?Short-term goal: Practice implement relaxation training as a coping mechanism for tension panic stress anger and anxiety ? ?Shannon Swanson, [facility] ? ? ? ? ? ? ? ? ? ? ? ? ? ? ? ? ? ? ?

## 2021-10-22 ENCOUNTER — Other Ambulatory Visit (HOSPITAL_COMMUNITY): Payer: Self-pay

## 2021-10-22 MED ORDER — TRAMADOL HCL 50 MG PO TABS
ORAL_TABLET | ORAL | 0 refills | Status: DC
  Filled 2021-10-22: qty 40, 10d supply, fill #0

## 2021-10-22 MED ORDER — MELOXICAM 7.5 MG PO TABS
ORAL_TABLET | ORAL | 0 refills | Status: DC
  Filled 2021-10-22: qty 60, 30d supply, fill #0

## 2021-11-08 ENCOUNTER — Ambulatory Visit (INDEPENDENT_AMBULATORY_CARE_PROVIDER_SITE_OTHER): Payer: No Typology Code available for payment source | Admitting: Psychiatry

## 2021-11-08 DIAGNOSIS — F4310 Post-traumatic stress disorder, unspecified: Secondary | ICD-10-CM | POA: Diagnosis not present

## 2021-11-08 NOTE — Progress Notes (Signed)
?      [facility] Counselor/Therapist Progress Note ? ?Patient ID: Shannon BISAILLON, MRN: [id number],   ? ?Date: 11/08/2021 ? ?Time Spent: 50 minutes start time 3:11 PM end time 4:01 PM ? ?Treatment Type: Individual Therapy ? ?Reported Symptoms: anxiety, sadness, chronic pain, migraines, dizziness, sleep issues ? ?Mental Status Exam: ? ?Appearance:   Well Groomed     ?Behavior:  Appropriate  ?Motor:  Normal  ?Speech/Language:   Normal Rate  ?Affect:  Appropriate  ?Mood:  anxious  ?Thought process:  normal  ?Thought content:    WNL  ?Sensory/Perceptual disturbances:    WNL  ?Orientation:  oriented to person  ?Attention:  Good  ?Concentration:  Good  ?Memory:  WNL  ?Fund of knowledge:   Good  ?Insight:    Good  ?Judgment:   Good  ?Impulse Control:  Good  ? ?Risk Assessment: ?Danger to Self:  No ?Self-injurious Behavior: No ?Danger to Others: No ?Duty to Warn:no ?Physical Aggression / Violence:No  ?Access to Firearms a concern: No  ?Gang Involvement:No  ? ?Subjective: Patient was present for session.  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 She is still waiting to for her 2nd opinion of her neck.  Patient stated she is still very overwhelmed and having a hard time dealing with the stress of the chronic pain and not being able to do what she needs to do.  She shared she has been given a new position right now for work.  While she was there there was an agitated behavioral health patient that she found very triggering, suds level 6, negative cognition "I am not safe" felt anxiety in her chest.  Patient was only able to reduce suds level to 5.  She is still getting very triggered and overwhelmed with the whole situation.  Also different symptoms physically from the situation surfaced and that was very triggering for her as well.  Patient was encouraged to continue working on affirmations and reminding herself she is enough.  Was also encouraged to  think through what she can do currently and what is realistic for her. ? ?Interventions: Cognitive Behavioral Therapy, Solution-Oriented/Positive Psychology, Eye Movement Desensitization and Reprocessing (EMDR), and Insight-Oriented ? ?Diagnosis: ?  ICD-10-CM   ?1. Post traumatic stress disorder (PTSD)  F43.10   ?  ? ? ?Plan: Patient is to use CBT and coping skills to decrease triggered responses.  Patient is to continue avoiding behavioral health patients when she is in the emergency room as much is possible.  Patient is to work on reminding herself that she is enough and still has a purpose even when she is unable to work.  She is to try and focus on concrete things that she can still do.  Patient is to continue working with providers on her medical issues.  Patient is to take medication as directed ?Long-term goal: Recall the traumatic event without becoming overwhelming negative emotions ?Short-term goal: Practice implement relaxation training as a coping mechanism for tension panic stress anger and anxiety ? ?Lina Sayre, [facility] ? ? ? ? ? ? ? ? ? ? ? ? ? ? ? ? ? ? ?

## 2021-11-09 ENCOUNTER — Other Ambulatory Visit: Payer: Self-pay | Admitting: Psychiatry

## 2021-11-09 DIAGNOSIS — F422 Mixed obsessional thoughts and acts: Secondary | ICD-10-CM

## 2021-11-09 DIAGNOSIS — F411 Generalized anxiety disorder: Secondary | ICD-10-CM

## 2021-11-09 NOTE — Telephone Encounter (Signed)
Pt's therapist relayed message that pt reported issues with sleep medication during their session and was requesting a call. Attempted to reach pt. No answer. VM full and unable to leave a message.  ?

## 2021-11-09 NOTE — Telephone Encounter (Signed)
Called pt about medication issues. She reports that she did not start Rexulti immediately. She reports that she started Rexulti 1/2 tab and then went to every other night and was having vivid dreams/nightmares. She reports that she was sleeping more soundly and then was more sore from sleeping in the same position for a longer amount of time than usual. She reports that she may have started to notice a benefit. She had a gynecology apt and liver enzymes were slightly elevated. She then stopped Rexulti.  ? ?She has been having increased stressors. She reports that she has almost had panic attacks a few times and family has been staying with her at times.  ? ?Recommend trying Rexulti 0.25 mg 1/2 tablet every other day ? ?D ?

## 2021-11-10 ENCOUNTER — Other Ambulatory Visit (HOSPITAL_COMMUNITY): Payer: Self-pay

## 2021-11-10 NOTE — Telephone Encounter (Signed)
Unable to send controlled electronically. Did not see message until 7:58 PM on 5/3. Check to see if can send electronically now.  ?

## 2021-11-11 ENCOUNTER — Other Ambulatory Visit (HOSPITAL_COMMUNITY): Payer: Self-pay

## 2021-11-11 ENCOUNTER — Other Ambulatory Visit: Payer: Self-pay

## 2021-11-11 DIAGNOSIS — F422 Mixed obsessional thoughts and acts: Secondary | ICD-10-CM

## 2021-11-11 DIAGNOSIS — F411 Generalized anxiety disorder: Secondary | ICD-10-CM

## 2021-11-11 MED ORDER — DIAZEPAM 2 MG PO TABS
1.0000 mg | ORAL_TABLET | Freq: Two times a day (BID) | ORAL | 1 refills | Status: DC | PRN
  Filled 2021-11-11: qty 30, 15d supply, fill #0
  Filled 2021-12-03: qty 30, 15d supply, fill #1

## 2021-11-12 ENCOUNTER — Other Ambulatory Visit (HOSPITAL_COMMUNITY): Payer: Self-pay

## 2021-11-18 ENCOUNTER — Other Ambulatory Visit (HOSPITAL_COMMUNITY): Payer: Self-pay

## 2021-11-18 MED ORDER — METHYLPREDNISOLONE 4 MG PO TBPK
ORAL_TABLET | ORAL | 0 refills | Status: DC
  Filled 2021-11-18: qty 21, 6d supply, fill #0

## 2021-11-19 ENCOUNTER — Other Ambulatory Visit (HOSPITAL_COMMUNITY): Payer: Self-pay

## 2021-11-19 MED ORDER — SODIUM FLUORIDE 1.1 % DT CREA
TOPICAL_CREAM | DENTAL | 6 refills | Status: DC
  Filled 2021-11-19: qty 51, 30d supply, fill #0

## 2021-11-20 ENCOUNTER — Emergency Department (HOSPITAL_COMMUNITY): Payer: No Typology Code available for payment source

## 2021-11-20 ENCOUNTER — Encounter (HOSPITAL_COMMUNITY): Payer: Self-pay | Admitting: *Deleted

## 2021-11-20 ENCOUNTER — Emergency Department (HOSPITAL_COMMUNITY)
Admission: EM | Admit: 2021-11-20 | Discharge: 2021-11-20 | Disposition: A | Discharge status: Home / Self Care | Payer: No Typology Code available for payment source | Attending: Emergency Medicine | Admitting: Emergency Medicine

## 2021-11-20 ENCOUNTER — Other Ambulatory Visit: Payer: Self-pay

## 2021-11-20 DIAGNOSIS — D72829 Elevated white blood cell count, unspecified: Secondary | ICD-10-CM | POA: Insufficient documentation

## 2021-11-20 DIAGNOSIS — N201 Calculus of ureter: Secondary | ICD-10-CM | POA: Insufficient documentation

## 2021-11-20 DIAGNOSIS — R1030 Lower abdominal pain, unspecified: Secondary | ICD-10-CM | POA: Diagnosis present

## 2021-11-20 LAB — CBC
HCT: 40.9 % (ref 36.0–46.0)
Hemoglobin: 13.8 g/dL (ref 12.0–15.0)
MCH: 30.8 pg (ref 26.0–34.0)
MCHC: 33.7 g/dL (ref 30.0–36.0)
MCV: 91.3 fL (ref 80.0–100.0)
Platelets: 301 10*3/uL (ref 150–400)
RBC: 4.48 MIL/uL (ref 3.87–5.11)
RDW: 12.2 % (ref 11.5–15.5)
WBC: 19.3 10*3/uL — ABNORMAL HIGH (ref 4.0–10.5)
nRBC: 0 % (ref 0.0–0.2)

## 2021-11-20 LAB — URINALYSIS, ROUTINE W REFLEX MICROSCOPIC
Bacteria, UA: NONE SEEN
Bilirubin Urine: NEGATIVE
Glucose, UA: NEGATIVE mg/dL
Ketones, ur: NEGATIVE mg/dL
Leukocytes,Ua: NEGATIVE
Nitrite: NEGATIVE
Protein, ur: 30 mg/dL — AB
RBC / HPF: >50 RBC/hpf — ABNORMAL HIGH (ref 0–5)
Specific Gravity, Urine: 1.017 (ref 1.005–1.030)
pH: 6 (ref 5.0–8.0)

## 2021-11-20 LAB — COMPREHENSIVE METABOLIC PANEL
ALT: 26 U/L (ref 0–44)
AST: 24 U/L (ref 15–41)
Albumin: 4.2 g/dL (ref 3.5–5.0)
Alkaline Phosphatase: 77 U/L (ref 38–126)
Anion gap: 8 (ref 5–15)
BUN: 12 mg/dL (ref 6–20)
CO2: 26 mmol/L (ref 22–32)
Calcium: 9.5 mg/dL (ref 8.9–10.3)
Chloride: 106 mmol/L (ref 98–111)
Creatinine, Ser: 0.99 mg/dL (ref 0.44–1.00)
GFR, Estimated: >60 mL/min (ref >60)
Glucose, Bld: 130 mg/dL — ABNORMAL HIGH (ref 70–99)
Potassium: 3.7 mmol/L (ref 3.5–5.1)
Sodium: 140 mmol/L (ref 135–145)
Total Bilirubin: 0.6 mg/dL (ref 0.3–1.2)
Total Protein: 6.8 g/dL (ref 6.5–8.1)

## 2021-11-20 LAB — I-STAT BETA HCG BLOOD, ED (MC, WL, AP ONLY): I-stat hCG, quantitative: 6.2 m[IU]/mL — ABNORMAL HIGH (ref <5)

## 2021-11-20 LAB — TROPONIN I (HIGH SENSITIVITY)
Troponin I (High Sensitivity): 4 ng/L (ref <18)
Troponin I (High Sensitivity): 3 ng/L (ref <18)

## 2021-11-20 LAB — LIPASE, BLOOD: Lipase: 28 U/L (ref 11–51)

## 2021-11-20 MED ORDER — FENTANYL CITRATE PF 50 MCG/ML IJ SOSY
100.0000 ug | PREFILLED_SYRINGE | Freq: Once | INTRAMUSCULAR | Status: AC
  Administered 2021-11-20: 100 ug via INTRAVENOUS
  Filled 2021-11-20: qty 2

## 2021-11-20 MED ORDER — HYDROCODONE-ACETAMINOPHEN 7.5-325 MG/15ML PO SOLN: 10.0000 mL | Freq: Once | ORAL | Status: DC

## 2021-11-20 MED ORDER — HYDROCODONE-ACETAMINOPHEN 7.5-325 MG/15ML PO SOLN
10.0000 mL | Freq: Four times a day (QID) | ORAL | 0 refills | Status: DC | PRN
Start: 2021-11-20 — End: 2021-12-01

## 2021-11-20 MED ORDER — TAMSULOSIN HCL 0.4 MG PO CAPS: 0.4000 mg | ORAL_CAPSULE | Freq: Every day | ORAL | 0 refills | Status: DC | PRN

## 2021-11-20 MED ORDER — LACTATED RINGERS IV BOLUS
1000.0000 mL | Freq: Once | INTRAVENOUS | Status: AC
  Administered 2021-11-20: 1000 mL via INTRAVENOUS

## 2021-11-20 MED ORDER — ONDANSETRON 4 MG PO TBDP: 4.0000 mg | ORAL_TABLET | Freq: Three times a day (TID) | ORAL | 0 refills | Status: AC | PRN

## 2021-11-20 MED ORDER — KETOROLAC TROMETHAMINE 15 MG/ML IJ SOLN
15.0000 mg | Freq: Once | INTRAMUSCULAR | Status: AC
  Administered 2021-11-20: 15 mg via INTRAVENOUS
  Filled 2021-11-20: qty 1

## 2021-11-20 MED ORDER — KETOROLAC TROMETHAMINE 15 MG/ML IJ SOLN: 15.0000 mg | Freq: Once | INTRAMUSCULAR | Status: DC

## 2021-11-20 MED ORDER — ONDANSETRON HCL 4 MG/2ML IJ SOLN
4.0000 mg | Freq: Once | INTRAMUSCULAR | Status: AC
  Administered 2021-11-20: 4 mg via INTRAVENOUS
  Filled 2021-11-20: qty 2

## 2021-11-20 MED ORDER — HYDROCODONE-ACETAMINOPHEN 5-325 MG PO TABS
1.0000 | ORAL_TABLET | Freq: Once | ORAL | Status: AC
  Administered 2021-11-20: 1 via ORAL
  Filled 2021-11-20: qty 1

## 2021-11-20 NOTE — Discharge Instructions (Addendum)
Do not take the tramadol if you are taking the hydrocodone.  Do not take ibuprofen or other NSAIDS while taking the Mobic/meloxicam ? ?Today you received medications that may make you sleepy or impair your ability to make decisions.  For the next 24 hours please do not drive, operate heavy machinery, care for a small child with out another adult present, or perform any activities that may cause harm to you or someone else if you were to fall asleep or be impaired.  ? ?You are being prescribed a medication which may make you sleepy. Please follow up of listed precautions for at least 24 hours after taking one dose. ? ?Please take Ibuprofen (Advil, motrin) and Tylenol (acetaminophen) to relieve your pain.   ? ?You may take up to 600 MG (3 pills) of normal strength ibuprofen every 8 hours as needed.   ?You make take tylenol, up to 1,000 mg (two extra strength pills) every 8 hours as needed.  ? ?It is safe to take ibuprofen and tylenol at the same time as they work differently.  ? Do not take more than 3,000 mg tylenol in a 24 hour period (not more than one dose every 8 hours.  Please check all medication labels as many medications such as pain and cold medications may contain tylenol.  Do not drink alcohol while taking these medications.  Do not take other NSAID'S while taking ibuprofen (such as aleve or naproxen).  Please take ibuprofen with food to decrease stomach upset. ? ?

## 2021-11-20 NOTE — ED Notes (Signed)
Collected urine sample in waiting room.  ?

## 2021-11-20 NOTE — ED Provider Notes (Signed)
?[facility] ?Provider Note ? ? ?CSN: [id number] ?Arrival date & time: 11/20/21  1448 ? ?  ? ?History ? ?Chief Complaint  ?Patient presents with  ? Flank Pain  ? ? ?Shannon Swanson is a 51 y.o. female who presents today for evaluation of pain in her right-sided flank.  This initially started earlier this morning.  The pain has now wrapped around into her right-sided groin and shoots into her labia.  She reports she has a history of kidney stones.  She denies any fevers.  She reports the pain is severe to the point that she has almost syncopized twice.  She reports that she did recently start a prednisone burst for increased pain in her spine, her first dose on this was yesterday. ? ?She reports hematuria, urinary frequency and pain with urination that all started this morning. ? ? ? ?HPI ? ?  ? ?Home Medications ?Prior to Admission medications   ?Medication Sig Start Date End Date Taking? Authorizing Provider  ?HYDROcodone-acetaminophen (HYCET) 7.5-325 mg/15 ml solution Take 10 mLs by mouth every 6 (six) hours as needed for moderate pain or severe pain. 11/20/21  Yes Lorin Glass, PA-C  ?ondansetron (ZOFRAN-ODT) 4 MG disintegrating tablet Take 1 tablet (4 mg total) by mouth every 8 (eight) hours as needed for nausea or vomiting. 11/20/21  Yes Lorin Glass, PA-C  ?tamsulosin (FLOMAX) 0.4 MG CAPS capsule Take 1 capsule (0.4 mg total) by mouth daily as needed (Kidney stones). 11/20/21  Yes Lorin Glass, PA-C  ?brexpiprazole (REXULTI) 0.25 MG TABS tablet Take 1 tablet (0.25 mg total) by mouth daily. 09/01/21   [name], PMHNP  ?diazepam (VALIUM) 2 MG tablet Take 1/2 to 1 tablet  by mouth 2  times daily as needed for anxiety. 11/11/21 05/10/22  [name], PMHNP  ?diclofenac Sodium (VOLTAREN) 1 % GEL Apply 2 g topically 3 (three) times daily as needed for pain. ?Patient not taking: Reported on 10/27/2020 05/06/20   [provider]  ?escitalopram  (LEXAPRO) 5 MG/5ML solution Take 20 mLs (20 mg total) by mouth daily. 09/01/21 11/30/21  [name], PMHNP  ?lactase (LACTAID) 3000 UNITS tablet Take 9,000 Units by mouth 3 (three) times daily with meals.    [provider]  ?meloxicam (MOBIC) 7.5 MG tablet take 1 tablet by mouth every other day 12/03/20     ?meloxicam (MOBIC) 7.5 MG tablet Take 1 tablet (7.5 mg total) by mouth twice daily 12/31/20     ?meloxicam (MOBIC) 7.5 MG tablet Take 1 tablet by mouth 2 times every day 10/22/21     ?methylphenidate (RITALIN) 5 MG tablet Take 1 tablet (5 mg total) by mouth 3 (three) times daily. 04/28/21   [name], [place]  ?methylPREDNISolone (MEDROL DOSEPAK) 4 MG TBPK tablet Use as directed on the pack 11/18/21     ?sodium fluoride (PREVIDENT 5000 PLUS) 1.1 % CREA dental cream Brush teeth twice daily. Do not eat, drink, or rinse for 30 minutes. 11/10/21     ?tiZANidine (ZANAFLEX) 4 MG tablet Take 1 tablet (4 mg total) by mouth 3 (three) times daily as needed. 11/27/20     ?tiZANidine (ZANAFLEX) 4 MG tablet Take 1 tablet by mouth 3 times daily as needed. 09/23/21     ?traMADol (ULTRAM) 50 MG tablet Take 50 mg by mouth every 6 (six) hours as needed for moderate pain.    [provider]  ?traMADol (ULTRAM) 50 MG tablet Take 1 tablet by mouth every  6 hours as needed 03/09/21     ?traMADol (ULTRAM) 50 MG tablet Take 1 tablet by mouth every 6 hours as needed 10/22/21     ?traZODone (DESYREL) 50 MG tablet Take 1/2 - 2 tablets by mouth at bedtime as needed ?Patient not taking: Reported on 09/01/2021 01/26/21   [name], [place]  ?   ? ?Allergies    ?Meperidine hcl, Lactose intolerance (gi), and Codeine   ? ?Review of Systems   ?Review of Systems ? ?Physical Exam ?Updated Vital Signs ?BP 127/79   Pulse 83   Temp 98.3 ?F (36.8 ?C)   Resp 19   Ht '5\' 8"'$  (1.727 m)   Wt 78.9 kg   LMP  (LMP Unknown)   SpO2 97%   BMI 26.45 kg/m?  ?Physical Exam ?Vitals and nursing note reviewed.  ?Constitutional:   ?   Comments:  Patient is obviously in severe pain, hyperventilating and tremoring.   ?HENT:  ?   Head: Atraumatic.  ?Eyes:  ?   Conjunctiva/sclera: Conjunctivae normal.  ?Neck:  ?   Comments: Patient denies new trauma, has recent neck fusion and increased in neck pain.  ROM not specifically tested. ?Cardiovascular:  ?   Rate and Rhythm: Normal rate and regular rhythm.  ?   Heart sounds: Normal heart sounds.  ?Pulmonary:  ?   Effort: Pulmonary effort is normal. No respiratory distress.  ?Abdominal:  ?   General: There is no distension.  ?   Tenderness: There is no abdominal tenderness. There is right CVA tenderness. There is no left CVA tenderness.  ?Musculoskeletal:  ?   Right lower leg: No edema.  ?   Left lower leg: No edema.  ?   Comments: No obvious acute injury  ?Skin: ?   General: Skin is warm and dry.  ?Neurological:  ?   Mental Status: She is alert.  ?   Comments: Awake and alert, answers all questions appropriately.  Speech is not slurred. ?Intermittent tremoring while awake, when pain is severe  ?Psychiatric:     ?   Mood and Affect: Mood normal.     ?   Behavior: Behavior normal.  ? ? ?ED Results / Procedures / Treatments   ?Labs ?(all labs ordered are listed, but only abnormal results are displayed) ?Labs Reviewed  ?COMPREHENSIVE METABOLIC PANEL - Abnormal; Notable for the following components:  ?    Result Value  ? Glucose, Bld 130 (*)   ? All other components within normal limits  ?CBC - Abnormal; Notable for the following components:  ? WBC 19.3 (*)   ? All other components within normal limits  ?URINALYSIS, ROUTINE W REFLEX MICROSCOPIC - Abnormal; Notable for the following components:  ? APPearance HAZY (*)   ? Hgb urine dipstick LARGE (*)   ? Protein, ur 30 (*)   ? RBC / HPF >50 (*)   ? All other components within normal limits  ?I-STAT BETA HCG BLOOD, ED ([facility], [facility], [facility] ONLY) - Abnormal; Notable for the following components:  ? I-stat hCG, quantitative 6.2 (*)   ? All other components within normal limits  ?URINE  CULTURE  ?LIPASE, BLOOD  ?TROPONIN I (HIGH SENSITIVITY)  ?TROPONIN I (HIGH SENSITIVITY)  ? ? ?EKG ?EKG Interpretation ? ?Date/Time:  Saturday Nov 20 2021 16:22:22 EDT ?Ventricular Rate:  66 ?PR Interval:  148 ?QRS Duration: 88 ?QT Interval:  394 ?QTC Calculation: 413 ?R Axis:   43 ?Text Interpretation: Normal sinus rhythm Possible Left atrial enlargement Confirmed  by Godfrey Pick [phone]) on 11/20/2021 6:07:22 PM ? ?Radiology ?CT Renal Stone Study ? ?Result Date: 11/20/2021 ?CLINICAL DATA:  Right-sided flank pain. EXAM: CT ABDOMEN AND PELVIS WITHOUT CONTRAST TECHNIQUE: Multidetector CT imaging of the abdomen and pelvis was performed following the standard protocol without IV contrast. RADIATION DOSE REDUCTION: This exam was performed according to the departmental dose-optimization program which includes automated exposure control, adjustment of the mA and/or kV according to patient size and/or use of iterative reconstruction technique. COMPARISON:  None Available. FINDINGS: Lower chest: No acute abnormality. Hepatobiliary: No focal liver abnormality is seen. No gallstones, gallbladder wall thickening, or biliary dilatation. Pancreas: Unremarkable. No pancreatic ductal dilatation or surrounding inflammatory changes. Spleen: Normal in size without focal abnormality. Adrenals/Urinary Tract: Adrenal glands are unremarkable. Kidneys are normal in size, without focal lesions. A 4 mm obstructing renal calculus is seen within the distal right ureter with marked severity right-sided hydronephrosis and hydroureter. 2 mm and 3 mm nonobstructing renal calculi are seen within the left kidney. Bladder is unremarkable. Stomach/Bowel: Stomach is within normal limits. Appendix appears normal. No evidence of bowel wall thickening, distention, or inflammatory changes. Vascular/Lymphatic: No significant vascular findings are present. No enlarged abdominal or pelvic lymph nodes. Reproductive: Uterus and bilateral adnexa are unremarkable.  Other: No abdominal wall hernia or abnormality. No abdominopelvic ascites. Musculoskeletal: No acute or significant osseous findings. IMPRESSION: 1. 4 mm obstructing renal calculus within the distal right ureter. 2

## 2021-11-20 NOTE — ED Triage Notes (Signed)
Rt flank pain since yesterday and frequent urination pain into her rt hip  blood in urine on arrival here. Hx of kidney stones  fever nausea ?

## 2021-11-22 LAB — URINE CULTURE

## 2021-11-23 ENCOUNTER — Other Ambulatory Visit: Payer: Self-pay | Admitting: Urology

## 2021-11-29 ENCOUNTER — Ambulatory Visit (INDEPENDENT_AMBULATORY_CARE_PROVIDER_SITE_OTHER): Payer: No Typology Code available for payment source | Admitting: Psychiatry

## 2021-11-29 DIAGNOSIS — F4310 Post-traumatic stress disorder, unspecified: Secondary | ICD-10-CM | POA: Diagnosis not present

## 2021-11-29 NOTE — Progress Notes (Signed)
      [facility] Counselor/Therapist Progress Note  Patient ID: NIAMH RADA, MRN: [id number],    Date: 11/29/2021  Time Spent: 50 minutes start time 3:10 PM end time 4 PM  Treatment Type: Individual Therapy  Reported Symptoms: anxiety,sadness, triggered responses, pain issues, sleep issues, panic, nightmares, obsessive thinking  Mental Status Exam:  Appearance:   Well Groomed     Behavior:  Appropriate  Motor:  Normal  Speech/Language:   Normal Rate  Affect:  Appropriate  Mood:  anxious  Thought process:  normal  Thought content:    WNL  Sensory/Perceptual disturbances:    Headache   Orientation:  oriented to person, place, time/date, and situation  Attention:  Good  Concentration:  Good  Memory:  WNL  Fund of knowledge:   Good  Insight:    Good  Judgment:   Good  Impulse Control:  Good   Risk Assessment: Danger to Self:  No Self-injurious Behavior: No Danger to Others: No Duty to Warn:no Physical Aggression / Violence:No  Access to Firearms a concern: No  Gang Involvement:No   Subjective: Patient was present for session.  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  She had to get steroid patch to help her some with the pain in her neck. She continues to have issues and that is leading to headaches.  Her panic is leading to chest pain so she is taking her PRN medication.  Patient did processing set on having to leave work due to neck pain, suds level 8, negative cognition "I am stuck" felt anxiety and sadness in her head neck and shoulders.  Patient was able to reduce suds level to 6.  She was able to recognize she still has value and worth even though she cannot be who she wants to be.  Patient was encouraged to try and focus on the things that she can still do even though the pain feels very great and overwhelming.  Patient is also to watch podcast by Dr. Tawanna Solo to work on neuro  cycle changes.  Interventions: Solution-Oriented/Positive Psychology, Eye Movement Desensitization and Reprocessing (EMDR), and Insight-Oriented  Diagnosis:   ICD-10-CM   1. Post traumatic stress disorder (PTSD)  F43.10       Plan: Patient is to use CBT and coping skills to decrease triggered responses.  Patient is to look at podcast by Dr. Tawanna Solo on neuro cycle changes.  Patient is to work on reminding herself that she is enough and still has a purpose even when she is unable to work.  She is to try and focus on concrete things that she can still do.  Patient is to continue working with providers on her medical issues.  Patient is to take medication as directed Long-term goal: Recall the traumatic event without becoming overwhelming negative emotions Short-term goal: Practice implement relaxation training as a coping mechanism for tension panic stress anger and anxiety  Lina Sayre, [facility]

## 2021-11-30 ENCOUNTER — Other Ambulatory Visit: Payer: Self-pay

## 2021-11-30 ENCOUNTER — Encounter (HOSPITAL_BASED_OUTPATIENT_CLINIC_OR_DEPARTMENT_OTHER): Payer: Self-pay | Admitting: Urology

## 2021-11-30 NOTE — Progress Notes (Addendum)
Spoke w/ via phone for pre-op interview---pt Lab needs dos----none               Lab results------cbc, cmet lipase and ua 11-20-2021 epic COVID test -----patient states asymptomatic no test needed Arrive at -------830 am 12-01-2021 NPO after MN NO Solid Food.  Clear liquids from MN until---730 am Med rec completed  Medications to take morning of surgery -----tizanidine, tramadol, diazepam prn Diabetic medication -----n/a Patient instructed no nail polish to be worn day of surgery Patient instructed to bring photo id and insurance card day of surgery Patient aware to have Driver (ride ) / caregiver  mother Shannon Swanson or best friend Social worker   for 24 hours after surgery  driver, caregiver son age 58 Shannon Swanson Patient Special Instructions -----none Pre-Op special Istructions -----none Patient verbalized understanding of instructions that were given at this phone interview. Patient denies shortness of breath, chest pain, fever, cough at this phone interview.    Addendum:ring stuck left 4th finger Neck limited rom and right shoulder limited rom Sleeps up on two pills due to current neck issues, occ trouble with swallowing pills due to past failed cervical neck surgeries

## 2021-11-30 NOTE — Anesthesia Preprocedure Evaluation (Signed)
Anesthesia Evaluation  Patient identified by MRN, date of birth, ID band Patient awake    Reviewed: Allergy & Precautions, NPO status , Patient's Chart, lab work & pertinent test results  History of Anesthesia Complications (+) PONV and history of anesthetic complications  Airway Mallampati: III  TM Distance: >3 FB Neck ROM: Full    Dental no notable dental hx.    Pulmonary neg pulmonary ROS,    Pulmonary exam normal        Cardiovascular negative cardio ROS Normal cardiovascular exam     Neuro/Psych Anxiety Depression ADHDS/p cervical fusion    GI/Hepatic negative GI ROS, Neg liver ROS,   Endo/Other  negative endocrine ROS  Renal/GU RIGHT URETERAL AND LEFT RENAL STONES  negative genitourinary   Musculoskeletal negative musculoskeletal ROS (+)   Abdominal   Peds  Hematology negative hematology ROS (+)   Anesthesia Other Findings Day of surgery medications reviewed with patient.  Reproductive/Obstetrics negative OB ROS                            Anesthesia Physical Anesthesia Plan  ASA: 2  Anesthesia Plan: General   Post-op Pain Management: Tylenol PO (pre-op)*   Induction: Intravenous  PONV Risk Score and Plan: 4 or greater and Treatment may vary due to age or medical condition, Midazolam, Scopolamine patch - Pre-op, Ondansetron and Dexamethasone  Airway Management Planned: LMA  Additional Equipment: None  Intra-op Plan:   Post-operative Plan: Extubation in OR  Informed Consent: I have reviewed the patients History and Physical, chart, labs and discussed the procedure including the risks, benefits and alternatives for the proposed anesthesia with the patient or authorized representative who has indicated his/her understanding and acceptance.     Dental advisory given  Plan Discussed with: CRNA  Anesthesia Plan Comments:        Anesthesia Quick Evaluation

## 2021-12-01 ENCOUNTER — Encounter (HOSPITAL_BASED_OUTPATIENT_CLINIC_OR_DEPARTMENT_OTHER): Payer: Self-pay | Admitting: Urology

## 2021-12-01 ENCOUNTER — Encounter (HOSPITAL_BASED_OUTPATIENT_CLINIC_OR_DEPARTMENT_OTHER)
Admission: RE | Disposition: A | Discharge status: Home / Self Care | Payer: Self-pay | Source: Home / Self Care | Attending: Urology

## 2021-12-01 ENCOUNTER — Ambulatory Visit (HOSPITAL_BASED_OUTPATIENT_CLINIC_OR_DEPARTMENT_OTHER)
Admission: RE | Admit: 2021-12-01 | Discharge: 2021-12-01 | Disposition: A | Discharge status: Home / Self Care | Payer: No Typology Code available for payment source | Attending: Urology | Admitting: Urology

## 2021-12-01 ENCOUNTER — Ambulatory Visit (HOSPITAL_BASED_OUTPATIENT_CLINIC_OR_DEPARTMENT_OTHER): Payer: No Typology Code available for payment source | Admitting: Anesthesiology

## 2021-12-01 ENCOUNTER — Other Ambulatory Visit (HOSPITAL_COMMUNITY): Payer: Self-pay

## 2021-12-01 DIAGNOSIS — G8929 Other chronic pain: Secondary | ICD-10-CM | POA: Diagnosis not present

## 2021-12-01 DIAGNOSIS — N132 Hydronephrosis with renal and ureteral calculous obstruction: Secondary | ICD-10-CM | POA: Insufficient documentation

## 2021-12-01 DIAGNOSIS — Z01818 Encounter for other preprocedural examination: Secondary | ICD-10-CM

## 2021-12-01 DIAGNOSIS — Z981 Arthrodesis status: Secondary | ICD-10-CM | POA: Diagnosis not present

## 2021-12-01 DIAGNOSIS — E669 Obesity, unspecified: Secondary | ICD-10-CM | POA: Diagnosis not present

## 2021-12-01 DIAGNOSIS — N202 Calculus of kidney with calculus of ureter: Secondary | ICD-10-CM

## 2021-12-01 DIAGNOSIS — Z6826 Body mass index (BMI) 26.0-26.9, adult: Secondary | ICD-10-CM | POA: Diagnosis not present

## 2021-12-01 HISTORY — PX: CYSTOSCOPY WITH RETROGRADE PYELOGRAM, URETEROSCOPY AND STENT PLACEMENT: SHX5789

## 2021-12-01 HISTORY — DX: Personal history of COVID-19: Z86.16

## 2021-12-01 HISTORY — PX: HOLMIUM LASER APPLICATION: SHX5852

## 2021-12-01 SURGERY — CYSTOURETEROSCOPY, WITH RETROGRADE PYELOGRAM AND STENT INSERTION
Anesthesia: General | Laterality: Bilateral

## 2021-12-01 MED ORDER — AMISULPRIDE (ANTIEMETIC) 5 MG/2ML IV SOLN
10.0000 mg | Freq: Once | INTRAVENOUS | Status: AC | PRN
  Administered 2021-12-01: 10 mg via INTRAVENOUS

## 2021-12-01 MED ORDER — MIDAZOLAM HCL 2 MG/2ML IJ SOLN
INTRAMUSCULAR | Status: AC
  Filled 2021-12-01: qty 2

## 2021-12-01 MED ORDER — PHENYLEPHRINE 80 MCG/ML (10ML) SYRINGE FOR IV PUSH (FOR BLOOD PRESSURE SUPPORT)
PREFILLED_SYRINGE | INTRAVENOUS | Status: DC | PRN
  Administered 2021-12-01: 160 ug via INTRAVENOUS
  Administered 2021-12-01: 80 ug via INTRAVENOUS
  Administered 2021-12-01 (×2): 160 ug via INTRAVENOUS

## 2021-12-01 MED ORDER — ONDANSETRON HCL 4 MG/2ML IJ SOLN
INTRAMUSCULAR | Status: AC
  Filled 2021-12-01: qty 2

## 2021-12-01 MED ORDER — KETOROLAC TROMETHAMINE 30 MG/ML IJ SOLN
INTRAMUSCULAR | Status: DC | PRN
Start: 2021-12-01 — End: 2021-12-01
  Administered 2021-12-01: 30 mg via INTRAVENOUS

## 2021-12-01 MED ORDER — PROPOFOL 10 MG/ML IV BOLUS
INTRAVENOUS | Status: DC | PRN
  Administered 2021-12-01: 180 mg via INTRAVENOUS

## 2021-12-01 MED ORDER — FENTANYL CITRATE (PF) 100 MCG/2ML IJ SOLN
INTRAMUSCULAR | Status: AC
  Filled 2021-12-01: qty 2

## 2021-12-01 MED ORDER — LIDOCAINE 2% (20 MG/ML) 5 ML SYRINGE
INTRAMUSCULAR | Status: DC | PRN
  Administered 2021-12-01: 100 mg via INTRAVENOUS

## 2021-12-01 MED ORDER — PROPOFOL 10 MG/ML IV BOLUS
INTRAVENOUS | Status: AC
  Filled 2021-12-01: qty 20

## 2021-12-01 MED ORDER — DEXAMETHASONE SODIUM PHOSPHATE 10 MG/ML IJ SOLN
INTRAMUSCULAR | Status: AC
  Filled 2021-12-01: qty 1

## 2021-12-01 MED ORDER — GENTAMICIN SULFATE 40 MG/ML IJ SOLN
400.0000 mg | INTRAVENOUS | Status: AC
  Administered 2021-12-01: 400 mg via INTRAVENOUS
  Filled 2021-12-01: qty 10

## 2021-12-01 MED ORDER — GLYCOPYRROLATE PF 0.2 MG/ML IJ SOSY
PREFILLED_SYRINGE | INTRAMUSCULAR | Status: AC
  Filled 2021-12-01: qty 1

## 2021-12-01 MED ORDER — SODIUM CHLORIDE 0.9 % IR SOLN
Status: DC | PRN
  Administered 2021-12-01: 6000 mL via INTRAVESICAL

## 2021-12-01 MED ORDER — IOHEXOL 300 MG/ML  SOLN
INTRAMUSCULAR | Status: DC | PRN
  Administered 2021-12-01: 30 mL via URETHRAL

## 2021-12-01 MED ORDER — GLYCOPYRROLATE PF 0.2 MG/ML IJ SOSY
PREFILLED_SYRINGE | INTRAMUSCULAR | Status: DC | PRN
  Administered 2021-12-01: .2 mg via INTRAVENOUS

## 2021-12-01 MED ORDER — HYDROCODONE-ACETAMINOPHEN 7.5-325 MG/15ML PO SOLN
10.0000 mL | Freq: Four times a day (QID) | ORAL | 0 refills | Status: DC | PRN
  Filled 2021-12-01: qty 118, 2d supply, fill #0

## 2021-12-01 MED ORDER — LIDOCAINE HCL (PF) 2 % IJ SOLN
INTRAMUSCULAR | Status: AC
  Filled 2021-12-01: qty 5

## 2021-12-01 MED ORDER — FENTANYL CITRATE (PF) 100 MCG/2ML IJ SOLN: 25.0000 ug | INTRAMUSCULAR | Status: DC | PRN

## 2021-12-01 MED ORDER — OXYCODONE HCL 5 MG/5ML PO SOLN: 5.0000 mg | Freq: Once | ORAL | Status: AC | PRN

## 2021-12-01 MED ORDER — KETOROLAC TROMETHAMINE 30 MG/ML IJ SOLN
INTRAMUSCULAR | Status: AC
  Filled 2021-12-01: qty 1

## 2021-12-01 MED ORDER — OXYCODONE HCL 5 MG PO TABS
ORAL_TABLET | ORAL | Status: AC
  Filled 2021-12-01: qty 1

## 2021-12-01 MED ORDER — ACETAMINOPHEN 500 MG PO TABS
ORAL_TABLET | ORAL | Status: AC
  Filled 2021-12-01: qty 2

## 2021-12-01 MED ORDER — ONDANSETRON HCL 4 MG/2ML IJ SOLN
INTRAMUSCULAR | Status: DC | PRN
  Administered 2021-12-01: 4 mg via INTRAVENOUS

## 2021-12-01 MED ORDER — MIDAZOLAM HCL 5 MG/5ML IJ SOLN
INTRAMUSCULAR | Status: DC | PRN
  Administered 2021-12-01: 2 mg via INTRAVENOUS

## 2021-12-01 MED ORDER — SCOPOLAMINE 1 MG/3DAYS TD PT72
MEDICATED_PATCH | TRANSDERMAL | Status: AC
  Filled 2021-12-01: qty 1

## 2021-12-01 MED ORDER — LACTATED RINGERS IV SOLN: INTRAVENOUS | Status: DC

## 2021-12-01 MED ORDER — AMISULPRIDE (ANTIEMETIC) 5 MG/2ML IV SOLN
INTRAVENOUS | Status: AC
  Filled 2021-12-01: qty 4

## 2021-12-01 MED ORDER — SCOPOLAMINE 1 MG/3DAYS TD PT72
1.0000 | MEDICATED_PATCH | Freq: Once | TRANSDERMAL | Status: DC
  Administered 2021-12-01: 1.5 mg via TRANSDERMAL

## 2021-12-01 MED ORDER — OXYCODONE HCL 5 MG PO TABS
5.0000 mg | ORAL_TABLET | Freq: Once | ORAL | Status: AC | PRN
  Administered 2021-12-01: 5 mg via ORAL

## 2021-12-01 MED ORDER — FENTANYL CITRATE (PF) 100 MCG/2ML IJ SOLN
INTRAMUSCULAR | Status: DC | PRN
  Administered 2021-12-01: 25 ug via INTRAVENOUS
  Administered 2021-12-01: 50 ug via INTRAVENOUS
  Administered 2021-12-01: 100 ug via INTRAVENOUS

## 2021-12-01 MED ORDER — ACETAMINOPHEN 500 MG PO TABS
1000.0000 mg | ORAL_TABLET | Freq: Once | ORAL | Status: AC
Start: 2021-12-01 — End: 2021-12-01
  Administered 2021-12-01: 1000 mg via ORAL

## 2021-12-01 MED ORDER — DEXAMETHASONE SODIUM PHOSPHATE 10 MG/ML IJ SOLN
INTRAMUSCULAR | Status: DC | PRN
  Administered 2021-12-01: 10 mg via INTRAVENOUS

## 2021-12-01 MED ORDER — CEPHALEXIN 250 MG/5ML PO SUSR
500.0000 mg | Freq: Two times a day (BID) | ORAL | 0 refills | Status: DC
  Filled 2021-12-01: qty 100, 5d supply, fill #0

## 2021-12-01 SURGICAL SUPPLY — 26 items
BAG DRAIN URO-CYSTO SKYTR STRL (DRAIN) ×2 IMPLANT
BAG DRN UROCATH (DRAIN) ×1
BASKET LASER NITINOL 1.9FR (BASKET) ×1 IMPLANT
BSKT STON RTRVL 120 1.9FR (BASKET) ×1
CATH INTERMIT  6FR 70CM (CATHETERS) ×1 IMPLANT
CLOTH BEACON ORANGE TIMEOUT ST (SAFETY) ×2 IMPLANT
FIBER LASER FLEXIVA 365 (UROLOGICAL SUPPLIES) IMPLANT
GLOVE BIO SURGEON STRL SZ7.5 (GLOVE) ×2 IMPLANT
GOWN STRL REUS W/TWL LRG LVL3 (GOWN DISPOSABLE) ×2 IMPLANT
GUIDEWIRE ANG ZIPWIRE 038X150 (WIRE) ×3 IMPLANT
GUIDEWIRE STR DUAL SENSOR (WIRE) ×3 IMPLANT
IV NS 1000ML (IV SOLUTION) ×2
IV NS 1000ML BAXH (IV SOLUTION) ×1 IMPLANT
IV NS IRRIG 3000ML ARTHROMATIC (IV SOLUTION) ×4 IMPLANT
KIT TURNOVER CYSTO (KITS) ×2 IMPLANT
MANIFOLD NEPTUNE II (INSTRUMENTS) ×2 IMPLANT
NS IRRIG 500ML POUR BTL (IV SOLUTION) ×2 IMPLANT
PACK CYSTO (CUSTOM PROCEDURE TRAY) ×2 IMPLANT
SHEATH URETERAL 12FRX28CM (UROLOGICAL SUPPLIES) ×1 IMPLANT
STENT POLARIS 5FRX22 (STENTS) ×2 IMPLANT
SYR 10ML LL (SYRINGE) ×2 IMPLANT
TRACTIP FLEXIVA PULS ID 200XHI (Laser) IMPLANT
TRACTIP FLEXIVA PULSE ID 200 (Laser) ×2
TUBE CONNECTING 12X1/4 (SUCTIONS) ×2 IMPLANT
TUBE FEEDING 8FR 16IN STR KANG (MISCELLANEOUS) ×1 IMPLANT
TUBING UROLOGY SET (TUBING) ×2 IMPLANT

## 2021-12-01 NOTE — Discharge Instructions (Addendum)
1 - You may have urinary urgency (bladder spasms) and bloody urine on / off with stent in place. This is normal.  2 - Remove tethered stents on Friday morning at home by pulling on strings, then blue-white plastic tubing, and discarding. Office is open Friday if any issues arise.   3 - Call MD or go to ER for fever >102, severe pain / nausea / vomiting not relieved by medications, or acute change in medical status    [facility] [phone] Post Ureteroscopy With or Without Stent Instructions  Definitions:  Ureter: The duct that transports urine from the kidney to the bladder. Stent:   A plastic hollow tube that is placed into the ureter, from the kidney to the bladder to prevent the ureter from swelling shut.  GENERAL INSTRUCTIONS:  Despite the fact that no skin incisions were used, the area around the ureter and bladder is raw and irritated. The stent is a foreign body which will further irritate the bladder wall. This irritation is manifested by increased frequency of urination, both day and night, and by an increase in the urge to urinate. In some, the urge to urinate is present almost always. Sometimes the urge is strong enough that you may not be able to stop yourself from urinating. The only real cure is to remove the stent and then give time for the bladder wall to heal which can't be done until the danger of the ureter swelling shut has passed, which varies.  You may see some blood in your urine while the stent is in place and a few days afterwards. Do not be alarmed, even if the urine was clear for a while. Get off your feet and drink lots of fluids until clearing occurs. If you start to pass clots or don't improve, call us.  DIET: You may return to your normal diet immediately. Because of the raw surface of your bladder, alcohol, spicy foods, acid type foods and drinks with caffeine may cause irritation or frequency and should be used in moderation. To keep your  urine flowing freely and to avoid constipation, drink plenty of fluids during the day ( 8-10 glasses ). Tip: Avoid cranberry juice because it is very acidic.  ACTIVITY: Your physical activity doesn't need to be restricted. However, if you are very active, you may see some blood in your urine. We suggest that you reduce your activity under these circumstances until the bleeding has stopped.  BOWELS: It is important to keep your bowels regular during the postoperative period. Straining with bowel movements can cause bleeding. A bowel movement every other day is reasonable. Use a mild laxative if needed, such as Milk of Magnesia 2-3 tablespoons, or 2 Dulcolax tablets. Call if you continue to have problems. If you have been taking narcotics for pain, before, during or after your surgery, you may be constipated. Take a laxative if necessary.   MEDICATION: You should resume your pre-surgery medications unless told not to. In addition you will often be given an antibiotic to prevent infection. These should be taken as prescribed until the bottles are finished unless you are having an unusual reaction to one of the drugs.  PROBLEMS YOU SHOULD REPORT TO [place]: Fevers over 100.5 Fahrenheit. Heavy bleeding, or clots ( See above notes about blood in urine ). Inability to urinate. Drug reactions ( hives, rash, nausea, vomiting, diarrhea ). Severe burning or pain with urination that is not improving.  FOLLOW-UP: You will need a follow-up appointment to  monitor your progress. Call for this appointment at the number listed above. Usually the first appointment will be about three to fourteen days after your surgery.      Post Anesthesia Home Care Instructions  Activity: Get plenty of rest for the remainder of the day. A responsible individual must stay with you for 24 hours following the procedure.  For the next 24 hours, DO NOT: -Drive a car -Paediatric nurse -Drink alcoholic beverages -Take any  medication unless instructed by your physician -Make any legal decisions or sign important papers.  Meals: Start with liquid foods such as gelatin or soup. Progress to regular foods as tolerated. Avoid greasy, spicy, heavy foods. If nausea and/or vomiting occur, drink only clear liquids until the nausea and/or vomiting subsides. Call your physician if vomiting continues.  Special Instructions/Symptoms: Your throat may feel dry or sore from the anesthesia or the breathing tube placed in your throat during surgery. If this causes discomfort, gargle with warm salt water. The discomfort should disappear within 24 hours.  If you had a scopolamine patch placed behind your ear for the management of post- operative nausea and/or vomiting:  1. The medication in the patch is effective for 72 hours, after which it should be removed.  Wrap patch in a tissue and discard in the trash. Wash hands thoroughly with soap and water. 2. You may remove the patch earlier than 72 hours if you experience unpleasant side effects which may include dry mouth, dizziness or visual disturbances. 3. Avoid touching the patch. Wash your hands with soap and water after contact with the patch. Remove patch behind left ear by Saturday, Dec 04, 2021.  Do not take any Tylenol until after 3:15 pm today. Do not take any nonsteroidal anti inflammatories until after 5:00 pm today.

## 2021-12-01 NOTE — H&P (Signed)
Shannon Swanson is an 51 y.o. female.    Chief Complaint: Pre-Op BILATERAL Ureteroscopic Stone Manipulation  HPI:   1 - Right Distal Ureteral / Left Renal Stones - 70m Rt distal ureteral (below iliacs) with chronic appearing hydro and non-obstructing 282mleft renal stone on ER CT 11/2021 on eval flank-abd pain. Cr 0.9, UCX non-clonal.   PMH sig for obesity, C+L spine surgery, chronic pain (narcotics + benzos rarely), non-cardiac chest pain, PTSD. Her PCP is MaDaiva EvesD   Today " Shannon Swanson is seen to proceed with BILATERAL ureteroscopic stone manipulation. No interval fevers. Most recent UA without infectious parameters.    Past Medical History:  Diagnosis Date   Anxiety    Attention deficit disorder (ADD)    Blood clot in vein 08/2020   after 08-2020 surgery post op   Complication of anesthesia 08/2020   limited neck mobility since cervical fusion, sleeps up on 2 pillows, right shoulder rotator cuff did noty heal properly after surgery   Depression    Dysrhythmia 2022   PACs- after epidural steroid injection in neck   Family history of adverse reaction to anesthesia    Mother, PONV and "difficult to wake up"   History of COVID-19    2021 or 2022 all symptoms resolved   History of kidney stones    MVP (mitral valve prolapse)    diagnosed at 1855Per notes from Dr. JaCathlean Cowern 2011, "no 2D echo evidence"   Nerve pain    per patient in bilateral hands fromn neck injury   PONV (postoperative nausea and vomiting)     Past Surgical History:  Procedure Laterality Date   ANTERIOR CERVICAL DECOMP/DISCECTOMY FUSION N/A 09/07/2020   Procedure: Anterior Cervical Decompression Fusion - Cervical four-Cervical five - Cervical six-Cervical seven with removal Cervical five-six;  Surgeon: CrKary KosMD;  [facility] Service: [facility]surgery;  Laterality: N/A;   CERVICAL DISCECTOMY  2007   anterior approach 2007 Dr CrSaintclair Halsted  DILATION AND CURETTAGE OF UTERUS     LUMBAR DISC SURGERY      rotator cuff repair Right 06/2019   partial repair by Dr. ChTamera Punt TONSILLECTOMY      Family History  Problem Relation Age of Onset   Emphysema Father    Diabetes Mother    Mitral valve prolapse Mother    Hiatal hernia Mother    Diabetes Sister    Diabetes Maternal Aunt    Diabetes Maternal Uncle    Social History:  reports that she has never smoked. She has never used smokeless tobacco. She reports that she does not drink alcohol and does not use drugs.  Allergies:  Allergies  Allergen Reactions   Meperidine Hcl Other (See Comments)    Severe headache   Lactose Intolerance (Gi)     Upset stomach    Codeine Nausea Only    TAKES ZOFRAN WITH FOR NAUSEA    No medications prior to admission.    No results found for this or any previous visit (from the past 48 hour(s)). No results found.  Review of Systems  Constitutional:  Negative for chills and fever.  Genitourinary:  Positive for urgency.  All other systems reviewed and are negative.  Height '5\' 8"'$  (1.727 m), weight 79.8 kg. Physical Exam Vitals reviewed.  HENT:     Head: Normocephalic.  Eyes:     Pupils: Pupils are equal, round, and reactive to light.  Cardiovascular:     Rate  and Rhythm: Normal rate.  Pulmonary:     Effort: Pulmonary effort is normal.  Abdominal:     General: Abdomen is flat.     Comments: Stable moderate obesity.   Genitourinary:    Comments: Minimal CVAT at present Musculoskeletal:        General: Normal range of motion.     Cervical back: Normal range of motion.  Skin:    General: Skin is warm.  Neurological:     General: No focal deficit present.     Mental Status: She is alert.     Assessment/Plan  Proceed as planned with BILATERAL ureteroscopic stone manipulation. Risks,  benefits, alternatives, expected peri-op course discussed previously and reiterated today including need for temporary stents given bilateral procedure.   Alexis Frock, MD 12/01/2021, 6:52 AM

## 2021-12-01 NOTE — Brief Op Note (Signed)
12/01/2021  11:10 AM  PATIENT:  Shannon Swanson  51 y.o. female  PRE-OPERATIVE DIAGNOSIS:  RIGHT URETERAL AND LEFT RENAL STONES  POST-OPERATIVE DIAGNOSIS:  RIGHT URETERAL AND LEFT RENAL STONES  PROCEDURE:  Procedure(s) with comments: CYSTOSCOPY WITH RETROGRADE PYELOGRAM, URETEROSCOPY AND STENT PLACEMENT (Bilateral) - 1 HR HOLMIUM LASER APPLICATION (Bilateral)  SURGEON:  Surgeon(s) and Role:    Alexis Frock, MD - Primary  PHYSICIAN ASSISTANT:   ASSISTANTS: none   ANESTHESIA:   general  EBL:  5 mL   BLOOD ADMINISTERED:none  DRAINS: none   LOCAL MEDICATIONS USED:  NONE  SPECIMEN:  Source of Specimen:  bilateral urolithiasis  DISPOSITION OF SPECIMEN:   part given to pt, part to [facility] for compositional analysis  COUNTS:  YES  TOURNIQUET:  * No tourniquets in log *  DICTATION: .Other Dictation: Dictation Number done  PLAN OF CARE: Admit for overnight observation  PATIENT DISPOSITION:  PACU - hemodynamically stable.   Delay start of Pharmacological VTE agent (>24hrs) due to surgical blood loss or risk of bleeding: not applicable

## 2021-12-01 NOTE — Op Note (Signed)
NAME: Shannon Swanson, Shannon Swanson MEDICAL RECORD NO: [id number] ACCOUNT NO: [id number] DATE OF BIRTH: 1970/11/16 FACILITY: [place] LOCATION: [facility] PHYSICIAN: Alexis Frock, MD  Operative Report   DATE OF PROCEDURE: 12/01/2021  PREOPERATIVE DIAGNOSIS:  Right ureteral and left renal stone.  POSTOPERATIVE DIAGNOSIS:  Right ureteral and left renal stone.  PROCEDURE PERFORMED:  1.  Cystoscopy with bilateral retrograde pyelograms interpretation. 2.  Bilateral ureteroscopy with laser lithotripsy. 3.  Insertion of bilateral ureteral stents.  ESTIMATED BLOOD LOSS:  Nil.  COMPLICATIONS:  None.  SPECIMENS:  Bilateral renal and ureteral stone fragments, partially given to the patient.  Additional given for composition analysis.  FINDINGS:  1.  Right distal third ureteral stone with mild hydronephrosis as anticipated. 2.  Left papillary tip calcification x2. 3.  Complete resolution of all accessible stone fragments larger than one-third mm following laser lithotripsy and basket extraction. 4.  Successful placement of bilateral ureteral stents, proximal end in the renal pelvis, distal end in urinary bladder, with tethers tucked per vagina.  INDICATIONS:  The patient is a pleasant 51 year old lady who was found on workup of colicky flank pain to have a right distal ureteral stone.  She had been on trial of medical therapy for some time but has failed to pass this.  She does have some  tortuosity of the right ureter, likely consistent with chronicity of the obstruction.  She has some papillary tip calcifications on the left as well.  Options were discussed for management including continued medical therapy versus shockwave lithotripsy  versus ureteroscopy unilateral versus bilateral and to proceed with bilateral ureteroscopy with goal of stone free.  Informed consent was obtained and placed in medical record.  PROCEDURE IN DETAIL:  The patient being verified, procedure being bilateral ureteroscopic  stone manipulation was confirmed.  Procedure timeout was performed.  Intravenous antibiotics were administered.  General anesthesia introduced.  The patient was  placed into a low lithotomy position.  Sterile field was created, prepped and draped the patient's vagina, introitus, and proximal thighs using iodine.  Cystourethroscopy was performed using 21-French rigid cystoscope with offset lens.  Inspection of  urinary bladder revealed no diverticula, calcifications, papillary lesions.  Ureteral orifices were single bilaterally.  The right ureteral orifice was cannulated with a 6-French end-hole catheter, and right retrograde pyelogram was obtained.  Right retrograde pyelogram demonstrated single right ureter, single system right kidney.  There was a filling defect in distal ureter consistent with known stone.  There was mild hydronephrosis above this.  A 0.038 ZIPwire was advanced to the level of  the upper pole, set aside as a safety wire.  Next, left retrograde pyelogram was obtained.  Left retrograde pyelogram demonstrated a single left ureter, single system left kidney.  No filling defects or narrowing noted.  A separate ZIPwire was advanced to the level of the upper pole, set aside as a safety wire.  An 8-French feeding tube placed  in the urinary bladder for pressure release.  Next, semirigid ureteroscopy was performed of the distal two-thirds left ureter alongside a separate sensor working wire.  No mucosal abnormalities were found.  Next, semirigid ureteroscopy was performed of  the distal right ureter alongside a separate sensor working wire.  In the distal third of the ureter just above the intramural ureter, stone in question was encountered.  There was some moderate mucosal edema surrounding this.  It appeared much too large  for simple basketing.  As such, holmium laser energy applied to the stone using a setting of 0.2  joules and 20 Hz, it was fragmented into three smaller pieces, which were  then sequentially grasped on their long axis, removed and set aside for  composition analysis.  Following this, complete resolution of all accessible stone fragments in the distal two-thirds right ureter, and the semirigid scope was exchanged for an 11/13 short length ureteral access sheath to the level of proximal ureter  using continuous fluoroscopic guidance and flexible digital ureteroscopy was performed of the proximal right ureter and systematic inspection of the right kidney including all calices x2.  There was no residual stone material, whatsoever.  Access sheath  was removed under continuous vision and the only mucosal abnormality noted was the moderate mucosal edema in the distal ureter that was not severely obstructing.  Next, the access sheath was placed over the left sensor working wire to the level of the  proximal left ureter using continuous fluoroscopic guidance and flexible digital ureteroscopy was performed of the proximal left ureter, systematic inspection of the left kidney, including all calices x3.  There were two dominant papillary tip  calcifications noted.  Each one of these was quite small at only 1 to 2 mm.  The left upper calcification was amenable to simple basketing, it was removed and set aside.  The lower papillary tip calcification was somewhat embedded and amenable to laser  lithotripsy and ablated using settings of 1 joule and 10 Hz.  Following this complete resolution of all accessible stone fragments larger than one-third mm, excellent hemostasis, no evidence of perforation, access sheath was removed under continuous  vision, no significant mucosal abnormalities were found.  Given the bilateral nature of the procedure, it was felt that brief interval stenting with tethered stents would be most prudent.  As such, a new 5 x 22 Polaris type stents were placed over the  remaining safety wires using fluoroscopic guidance.  Good proximal and distal planes were noted.  Tethers  were fashioned together, trimmed to length and tucked per vagina and the procedure was terminated.  The patient tolerated the procedure well, no  immediate periprocedural complications.  The patient was taken to postanesthesia care unit in stable condition, with plan for discharge home.   SHW D: 12/01/2021 11:15:50 am T: 12/01/2021 11:18:00 pm  JOB: [id number]/ [id number]

## 2021-12-01 NOTE — Transfer of Care (Signed)
Immediate Anesthesia Transfer of Care Note  Patient: Shannon Swanson  Procedure(s) Performed: CYSTOSCOPY WITH RETROGRADE PYELOGRAM, URETEROSCOPY AND STENT PLACEMENT (Bilateral) HOLMIUM LASER APPLICATION (Bilateral)  Patient Location: PACU  Anesthesia Type:General  Level of Consciousness: sedated  Airway & Oxygen Therapy: Patient Spontanous Breathing  Post-op Assessment: Report given to RN  Post vital signs: Reviewed and stable  Last Vitals:  Vitals Value Taken Time  BP 142/95 12/01/21 1119  Temp 36.6 C 12/01/21 1119  Pulse 108 12/01/21 1121  Resp 15 12/01/21 1121  SpO2 93 % 12/01/21 1121  Vitals shown include unvalidated device data.  Last Pain:  Vitals:   12/01/21 0845  TempSrc: Oral  PainSc: 4       Patients Stated Pain Goal: 2 (00/86/76 1950)  Complications: No notable events documented.

## 2021-12-01 NOTE — Anesthesia Postprocedure Evaluation (Signed)
Anesthesia Post Note  Patient: Shannon Swanson  Procedure(s) Performed: CYSTOSCOPY WITH RETROGRADE PYELOGRAM, URETEROSCOPY AND STENT PLACEMENT (Bilateral) HOLMIUM LASER APPLICATION (Bilateral)     Patient location during evaluation: PACU Anesthesia Type: General Level of consciousness: awake and alert Pain management: pain level controlled Vital Signs Assessment: post-procedure vital signs reviewed and stable Respiratory status: spontaneous breathing, nonlabored ventilation and respiratory function stable Cardiovascular status: blood pressure returned to baseline Postop Assessment: no apparent nausea or vomiting Anesthetic complications: no   No notable events documented.  Last Vitals:  Vitals:   12/01/21 1145 12/01/21 1200  BP: (!) 143/94 (!) 141/86  Pulse: (!) 104 97  Resp: 20 19  Temp:    SpO2: 93% 94%    Last Pain:  Vitals:   12/01/21 1145  TempSrc:   PainSc: Asleep                 Marthenia Rolling

## 2021-12-01 NOTE — Anesthesia Procedure Notes (Signed)
Procedure Name: LMA Insertion Date/Time: 12/01/2021 10:19 AM Performed by: Rogers Blocker, CRNA Pre-anesthesia Checklist: Patient identified, Emergency Drugs available, Suction available and Patient being monitored Patient Re-evaluated:Patient Re-evaluated prior to induction Oxygen Delivery Method: Circle System Utilized Preoxygenation: Pre-oxygenation with 100% oxygen Induction Type: IV induction Ventilation: Mask ventilation without difficulty LMA: LMA inserted LMA Size: 4.0 Number of attempts: 1 Placement Confirmation: positive ETCO2 Tube secured with: Tape Dental Injury: Teeth and Oropharynx as per pre-operative assessment

## 2021-12-02 ENCOUNTER — Encounter (HOSPITAL_BASED_OUTPATIENT_CLINIC_OR_DEPARTMENT_OTHER): Payer: Self-pay | Admitting: Urology

## 2021-12-03 ENCOUNTER — Other Ambulatory Visit: Payer: Self-pay | Admitting: Psychiatry

## 2021-12-03 ENCOUNTER — Other Ambulatory Visit (HOSPITAL_COMMUNITY): Payer: Self-pay

## 2021-12-03 DIAGNOSIS — F900 Attention-deficit hyperactivity disorder, predominantly inattentive type: Secondary | ICD-10-CM

## 2021-12-03 MED ORDER — MELOXICAM 7.5 MG PO TABS
ORAL_TABLET | ORAL | 0 refills | Status: DC
  Filled 2021-12-03: qty 60, 30d supply, fill #0

## 2021-12-03 MED ORDER — TRAMADOL HCL 50 MG PO TABS
ORAL_TABLET | ORAL | 0 refills | Status: DC
  Filled 2021-12-03: qty 40, 10d supply, fill #0

## 2021-12-03 MED ORDER — TIZANIDINE HCL 4 MG PO TABS
4.0000 mg | ORAL_TABLET | Freq: Three times a day (TID) | ORAL | 0 refills | Status: DC | PRN
  Filled 2021-12-03: qty 30, 10d supply, fill #0

## 2021-12-03 MED ORDER — KETOROLAC TROMETHAMINE 10 MG PO TABS
10.0000 mg | ORAL_TABLET | Freq: Four times a day (QID) | ORAL | 0 refills | Status: DC | PRN
  Filled 2021-12-03: qty 5, 2d supply, fill #0

## 2021-12-03 NOTE — Telephone Encounter (Signed)
Last filled 5/4, due 6/1

## 2021-12-07 ENCOUNTER — Other Ambulatory Visit (HOSPITAL_COMMUNITY): Payer: Self-pay

## 2021-12-07 ENCOUNTER — Telehealth: Payer: No Typology Code available for payment source | Admitting: Psychiatry

## 2021-12-07 DIAGNOSIS — F4310 Post-traumatic stress disorder, unspecified: Secondary | ICD-10-CM

## 2021-12-08 ENCOUNTER — Other Ambulatory Visit (HOSPITAL_COMMUNITY): Payer: Self-pay

## 2021-12-08 MED ORDER — SULFAMETHOXAZOLE-TRIMETHOPRIM 800-160 MG PO TABS: 1.0000 | ORAL_TABLET | Freq: Two times a day (BID) | ORAL | 0 refills | Status: DC

## 2021-12-08 MED ORDER — SULFAMETHOXAZOLE-TRIMETHOPRIM 200-40 MG/5ML PO SUSP
ORAL | 0 refills | Status: DC
  Filled 2021-12-08: qty 100, 3d supply, fill #0
  Filled 2021-12-08: qty 180, 4d supply, fill #0

## 2021-12-08 NOTE — Progress Notes (Signed)
Pt came to office for virtual apt. Provider unable to reach pt by phone. Pt rescheduled for 12/09/21.

## 2021-12-09 ENCOUNTER — Ambulatory Visit (INDEPENDENT_AMBULATORY_CARE_PROVIDER_SITE_OTHER): Payer: No Typology Code available for payment source | Admitting: Psychiatry

## 2021-12-09 ENCOUNTER — Other Ambulatory Visit (HOSPITAL_COMMUNITY): Payer: Self-pay

## 2021-12-09 ENCOUNTER — Encounter: Payer: Self-pay | Admitting: Psychiatry

## 2021-12-09 DIAGNOSIS — F900 Attention-deficit hyperactivity disorder, predominantly inattentive type: Secondary | ICD-10-CM

## 2021-12-09 DIAGNOSIS — F411 Generalized anxiety disorder: Secondary | ICD-10-CM

## 2021-12-09 DIAGNOSIS — F4310 Post-traumatic stress disorder, unspecified: Secondary | ICD-10-CM

## 2021-12-09 DIAGNOSIS — F322 Major depressive disorder, single episode, severe without psychotic features: Secondary | ICD-10-CM

## 2021-12-09 MED ORDER — METHYLPHENIDATE HCL 5 MG PO TABS
5.0000 mg | ORAL_TABLET | Freq: Three times a day (TID) | ORAL | 0 refills | Status: DC
  Filled 2021-12-09 – 2022-01-31 (×2): qty 270, 90d supply, fill #0
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
## 2021-12-20 ENCOUNTER — Ambulatory Visit: Payer: No Typology Code available for payment source | Admitting: Psychiatry

## 2021-12-28 ENCOUNTER — Other Ambulatory Visit (HOSPITAL_COMMUNITY): Payer: Self-pay

## 2021-12-28 MED ORDER — AZITHROMYCIN 500 MG PO TABS
ORAL_TABLET | ORAL | 0 refills | Status: DC
  Filled 2021-12-28 – 2022-01-26 (×2): qty 3, 3d supply, fill #0

## 2021-12-28 MED ORDER — ATOVAQUONE-PROGUANIL HCL 250-100 MG PO TABS
ORAL_TABLET | ORAL | 0 refills | Status: DC
  Filled 2021-12-28 – 2022-01-26 (×2): qty 21, 21d supply, fill #0

## 2022-01-10 ENCOUNTER — Ambulatory Visit (INDEPENDENT_AMBULATORY_CARE_PROVIDER_SITE_OTHER): Payer: No Typology Code available for payment source | Admitting: Psychiatry

## 2022-01-10 DIAGNOSIS — F4310 Post-traumatic stress disorder, unspecified: Secondary | ICD-10-CM

## 2022-01-10 NOTE — Progress Notes (Signed)
[facility] Counselor/Therapist Progress Note  Patient ID: Shannon Swanson, MRN: [id number],    Date: 01/10/2022  Time Spent: 50 minutes start time 3:09 PM end time 3:59 PM  Treatment Type: Individual Therapy  Reported Symptoms: anxiety, sadness, pain, hypervigilance, fatigue, triggered responses  Mental Status Exam:  Appearance:   Casual and Neat     Behavior:  Appropriate  Motor:  Normal  Speech/Language:   Normal Rate  Affect:  Appropriate  Mood:  anxious  Thought process:  normal  Thought content:    WNL  Sensory/Perceptual disturbances:    WNL  Orientation:  oriented to person, place, time/date, and situation  Attention:  Good  Concentration:  Good  Memory:  WNL  Fund of knowledge:   Good  Insight:    Good  Judgment:   Good  Impulse Control:  Good   Risk Assessment: Danger to Self:  No Self-injurious Behavior: No Danger to Others: No Duty to Warn:no Physical Aggression / Violence:No  Access to Firearms a concern: No  Gang Involvement:No   Subjective: Patient was present for session.  She shared she is still going through stuff with her work issues due to the trauma.  Patient reported she is still healing and looking to the potential of having another surgery.  She shared there is a possibility that the surgery will help for that is very concerning for her so at this point she does not have the resources to do anything.  Patient stated that she is very tight financially and is not sure how she will manage and is not sure how to do anymore working due to her physical limitations.  Tried to brainstorm different options with patient since she will not be allowed to return to her current work position.  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  Patient was encouraged to recognize that she has to try and figure out how to move forward even  though it goes against what she is wanting because her physical limitations are not going away.  Discussed different options of things that she can do and different ways that she can still find purpose and meaning in her life.  The importance of engaging with other people so that she does not get lost in her thoughts was discussed with patient.  Patient was able to agree to contact her church and see what activities she can get involved with to try and keep her brain moving in a positive direction.  Interventions: Cognitive Behavioral Therapy and Solution-Oriented/Positive Psychology  Diagnosis:   ICD-10-CM   1. Post traumatic stress disorder (PTSD)  F43.10       Plan:  Patient is to use CBT and coping skills to decrease triggered responses.  Patient is to contact her church to find activities that she can be engaged in that keep her from ruminating on negative thoughts.  Patient is to look at podcast by Dr. Tawanna Solo on neuro cycle changes.  Patient is to work on reminding herself that she is enough and still has a purpose even when she is unable to work.  She is to try and focus on concrete things that she can still do.  Patient is to continue working with providers on her medical issues.  Patient is to take medication as directed Long-term goal: Recall the traumatic event without becoming overwhelming  negative emotions Short-term goal: Practice implement relaxation training as a coping mechanism for tension panic stress anger and anxiety  Shannon Swanson, [facility]

## 2022-01-18 ENCOUNTER — Other Ambulatory Visit (HOSPITAL_BASED_OUTPATIENT_CLINIC_OR_DEPARTMENT_OTHER): Payer: Self-pay | Admitting: Neurosurgery

## 2022-01-18 ENCOUNTER — Other Ambulatory Visit: Payer: Self-pay | Admitting: Neurosurgery

## 2022-01-18 DIAGNOSIS — M542 Cervicalgia: Secondary | ICD-10-CM

## 2022-01-26 ENCOUNTER — Other Ambulatory Visit (HOSPITAL_COMMUNITY): Payer: Self-pay

## 2022-01-26 MED ORDER — TIZANIDINE HCL 4 MG PO TABS
4.0000 mg | ORAL_TABLET | Freq: Three times a day (TID) | ORAL | 0 refills | Status: DC | PRN
Start: 2022-01-26 — End: 2022-03-29
  Filled 2022-01-26: qty 30, 10d supply, fill #0

## 2022-01-26 MED ORDER — MELOXICAM 7.5 MG PO TABS
ORAL_TABLET | ORAL | 0 refills | Status: DC
  Filled 2022-01-26: qty 60, 30d supply, fill #0

## 2022-01-27 ENCOUNTER — Ambulatory Visit (HOSPITAL_COMMUNITY)
Admission: RE | Admit: 2022-01-27 | Discharge: 2022-01-27 | Disposition: A | Discharge status: Home / Self Care | Payer: PRIVATE HEALTH INSURANCE | Source: Ambulatory Visit | Attending: Neurosurgery | Admitting: Neurosurgery

## 2022-01-27 ENCOUNTER — Other Ambulatory Visit (HOSPITAL_COMMUNITY): Payer: Self-pay

## 2022-01-27 DIAGNOSIS — M542 Cervicalgia: Secondary | ICD-10-CM | POA: Insufficient documentation

## 2022-01-27 MED ORDER — TRAMADOL HCL 50 MG PO TABS
ORAL_TABLET | ORAL | 0 refills | Status: DC
  Filled 2022-01-27: qty 40, 10d supply, fill #0

## 2022-01-31 ENCOUNTER — Other Ambulatory Visit (HOSPITAL_COMMUNITY): Payer: Self-pay

## 2022-01-31 ENCOUNTER — Ambulatory Visit (INDEPENDENT_AMBULATORY_CARE_PROVIDER_SITE_OTHER): Payer: Commercial Managed Care - HMO | Admitting: Psychiatry

## 2022-01-31 ENCOUNTER — Telehealth: Payer: Self-pay | Admitting: Psychiatry

## 2022-01-31 DIAGNOSIS — F4310 Post-traumatic stress disorder, unspecified: Secondary | ICD-10-CM | POA: Diagnosis not present

## 2022-01-31 NOTE — Telephone Encounter (Signed)
Patient asking about manufacturer. She didn't know brand but described appearance of medication. She said she didn't feel like it worked well. Pharmacy said they were going to have to use 2 manufacturers in order to fill her script, but that the round white pill with a "W" on it was not one of them. Told patient to try to take all of one manufacturer at a time and to note if one worked better than the other for future refills.

## 2022-01-31 NOTE — Progress Notes (Signed)
[facility] Counselor/Therapist Progress Note  Patient ID: Shannon Swanson, MRN: [id number],    Date: 01/31/2022  Time Spent: 52 minutes start time 3:07 PM end time 3:59 PM  Treatment Type: Individual Therapy  Reported Symptoms: anxiety, chronic pain issues, nightmares, exaggerated startle response, focusing issues, fatigue  Mental Status Exam:  Appearance:   Casual     Behavior:  Appropriate  Motor:  Normal  Speech/Language:   Normal Rate  Affect:  Appropriate  Mood:  anxious  Thought process:  normal  Thought content:    WNL  Sensory/Perceptual disturbances:    WNL  Orientation:  oriented to person, place, time/date, and situation  Attention:  Good  Concentration:  Good  Memory:  WNL  Fund of knowledge:   Good  Insight:    Good  Judgment:   Good  Impulse Control:  Good   Risk Assessment: Danger to Self:  No Self-injurious Behavior: No Danger to Others: No Duty to Warn:no Physical Aggression / Violence:No  Access to Firearms a concern: No  Gang Involvement:No   Subjective: Patient was present for session.  She shared that she has started PT and she is hopeful that will help her. She is trying to focus on the positive things.  Patient went on to share that she is struggling with the fact that she gets exhausted and overwhelmed and does not get things done the way she wants to and is falling asleep in public.  Did processing set on falling asleep in public, suds level 9, negative cognition "I am broken" felt embarrassment in her neck and shoulders and arms.  Patient was only able to reduce suds level to 7.  She was able to recognize that the fatigue is coming from not sleeping at night and being stressed so much of the day because she does not know how to get things completed with her chronic pain issues and limited resources.  Patient was encouraged to start breaking things down into small pieces and only do what she can each day and to recognize that she is going to  have limitations and cannot beat herself up over it.  Different strategies to help her talk herself through what she has to accomplish was discussed in session.  Interventions: Cognitive Behavioral Therapy, Eye Movement Desensitization and Reprocessing (EMDR), and Insight-Oriented  Diagnosis:   ICD-10-CM   1. Post traumatic stress disorder (PTSD)  F43.10       Plan: Patient is to use CBT and coping skills to decrease triggered responses.  Patient is to contact her church to find activities that she can be engaged in that keep her from ruminating on negative thoughts.  Patient is to work on breaking things up into small pieces that she feels like she can accomplish.  Patient is to look at podcast by Dr. Tawanna Solo on neuro cycle changes.  Patient is to work on reminding herself that she is enough and still has a purpose even when she is unable to work.  She is to try and focus on concrete things that she can still do.  Patient is to continue working with providers on her medical issues.  Patient is to take medication as directed Long-term goal: Recall the traumatic event without becoming overwhelming negative emotions Short-term goal: Practice implement relaxation training as a coping mechanism for tension panic stress anger and anxiety    Lina Sayre, [facility]

## 2022-01-31 NOTE — Telephone Encounter (Signed)
Next appt is 03/21/22. Shannon Swanson is requesting a refill on her Methylphenidate 5 mg called to [id number]- [place], [place] - [address] She has a few left.

## 2022-01-31 NOTE — Telephone Encounter (Signed)
Patient has an Rx at [facility]. She was notified.

## 2022-02-01 ENCOUNTER — Other Ambulatory Visit (HOSPITAL_COMMUNITY): Payer: Self-pay

## 2022-02-02 ENCOUNTER — Other Ambulatory Visit (HOSPITAL_COMMUNITY): Payer: Self-pay

## 2022-02-23 ENCOUNTER — Ambulatory Visit: Payer: No Typology Code available for payment source | Admitting: Psychiatry

## 2022-02-25 ENCOUNTER — Other Ambulatory Visit (HOSPITAL_COMMUNITY): Payer: Self-pay

## 2022-03-01 ENCOUNTER — Other Ambulatory Visit (HOSPITAL_COMMUNITY): Payer: Self-pay

## 2022-03-01 MED ORDER — ZOSTER VAC RECOMB ADJUVANTED 50 MCG/0.5ML IM SUSR
INTRAMUSCULAR | 0 refills | Status: DC
  Filled 2022-03-01: qty 0.5, 1d supply, fill #0

## 2022-03-16 ENCOUNTER — Ambulatory Visit: Payer: No Typology Code available for payment source | Admitting: Psychiatry

## 2022-03-17 ENCOUNTER — Ambulatory Visit (INDEPENDENT_AMBULATORY_CARE_PROVIDER_SITE_OTHER): Payer: Commercial Managed Care - HMO | Admitting: Psychiatry

## 2022-03-17 DIAGNOSIS — F4310 Post-traumatic stress disorder, unspecified: Secondary | ICD-10-CM

## 2022-03-17 NOTE — Progress Notes (Signed)
[facility] Counselor/Therapist Progress Note  Patient ID: Shannon Swanson, MRN: [id number],    Date: 03/17/2022  Time Spent: 48 minutes start time 4:09 PM start time 4:57  Treatment Type: Individual Therapy  Reported Symptoms: chronic pain, triggered responses, anxiety, nightmares, flashbacks, sleep issues, fatigue  Mental Status Exam:  Appearance:   Well Groomed     Behavior:  Appropriate  Motor:  Normal  Speech/Language:   Normal Rate  Affect:  Appropriate  Mood:  anxious  Thought process:  normal  Thought content:    WNL  Sensory/Perceptual disturbances:    WNL  Orientation:  oriented to person, place, time/date, and situation  Attention:  Good  Concentration:  Good  Memory:  WNL  Fund of knowledge:   Good  Insight:    Good  Judgment:   Good  Impulse Control:  Good   Risk Assessment: Danger to Self:  No Self-injurious Behavior: No Danger to Others: No Duty to Warn:no Physical Aggression / Violence:No  Access to Firearms a concern: No  Gang Involvement:No   Subjective: Patient was present for session. She shared that her muscle tension is high and she is not sure if it is due to anxiety or chronic pain.  She continues to have flashbacks to incident in hospital.  Patient shared that she is not sure what to do due to the pain situation.  She shared she would like to be able to get back to some sort of work experience but is not sure that her doctor will even release her.  Patient shared that currently she is not sleeping and she is recognizing that seems to be impacting her in multiple ways.  Discussed talking to her provider about the sleep issues as well as her extreme fatigue.  Patient shared she is concerned that some of her levels might be off again and she agreed to talk to her provider about that possibility and checking to see if she can get some labs completed.  Patient was also encouraged to try and start working on a sleep schedule and a daily routine and  schedule so that she can have some normal in her life and see if that helps improve sleep issues.  Also encouraged patient to listen to the theta music and to work on grounding exercises prior to bedtime.  Interventions: Solution-Oriented/Positive Psychology and Insight-Oriented  Diagnosis:   ICD-10-CM   1. Post traumatic stress disorder (PTSD)  F43.10       Plan: Patient is to use CBT and coping skills to decrease triggered responses.  Patient is to contact her church to find activities that she can be engaged in that keep her from ruminating on negative thoughts.  Patient is to work on breaking things up into small pieces that she feels like she can accomplish.  Patient is to work on a daily routine and trying to develop healthy sleep patterns and hygiene.  Patient is to look at podcast by Dr. Tawanna Solo on neuro cycle changes.  Patient is to work on reminding herself that she is enough and still has a purpose even when she is unable to work.  She is to try and focus on concrete things that she can still do.  Patient is to continue working with providers on her medical issues.  Patient is to take medication as directed Long-term goal: Recall the traumatic event without becoming overwhelming negative emotions Short-term goal: Practice implement relaxation training as a coping mechanism for tension panic  stress anger and anxiety    Lina Sayre, [facility]

## 2022-03-18 ENCOUNTER — Encounter: Payer: Self-pay | Admitting: Psychiatry

## 2022-03-21 ENCOUNTER — Encounter: Payer: Self-pay | Admitting: Psychiatry

## 2022-03-21 ENCOUNTER — Other Ambulatory Visit (HOSPITAL_COMMUNITY): Payer: Self-pay

## 2022-03-21 ENCOUNTER — Ambulatory Visit (INDEPENDENT_AMBULATORY_CARE_PROVIDER_SITE_OTHER): Payer: Commercial Managed Care - HMO | Admitting: Psychiatry

## 2022-03-21 VITALS — BP 171/67 | HR 75

## 2022-03-21 DIAGNOSIS — F900 Attention-deficit hyperactivity disorder, predominantly inattentive type: Secondary | ICD-10-CM | POA: Diagnosis not present

## 2022-03-21 DIAGNOSIS — F4310 Post-traumatic stress disorder, unspecified: Secondary | ICD-10-CM | POA: Diagnosis not present

## 2022-03-21 DIAGNOSIS — F322 Major depressive disorder, single episode, severe without psychotic features: Secondary | ICD-10-CM | POA: Diagnosis not present

## 2022-03-21 DIAGNOSIS — R5382 Chronic fatigue, unspecified: Secondary | ICD-10-CM

## 2022-03-21 DIAGNOSIS — Z8639 Personal history of other endocrine, nutritional and metabolic disease: Secondary | ICD-10-CM

## 2022-03-21 MED ORDER — METHYLPHENIDATE HCL 5 MG PO TABS
5.0000 mg | ORAL_TABLET | Freq: Three times a day (TID) | ORAL | 0 refills | Status: DC
  Filled 2022-05-12: qty 97, 33d supply, fill #0
  Filled 2022-05-13: qty 173, 57d supply, fill #0

## 2022-03-21 MED ORDER — ESCITALOPRAM OXALATE 5 MG/5ML PO SOLN
20.0000 mg | Freq: Every day | ORAL | 1 refills | Status: DC
  Filled 2022-03-21 – 2022-04-18 (×2): qty 1800, 90d supply, fill #0
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
## 2022-03-29 ENCOUNTER — Other Ambulatory Visit (HOSPITAL_COMMUNITY): Payer: Self-pay

## 2022-03-29 MED ORDER — MELOXICAM 7.5 MG PO TABS
7.5000 mg | ORAL_TABLET | Freq: Two times a day (BID) | ORAL | 0 refills | Status: DC
  Filled 2022-03-29: qty 60, 30d supply, fill #0

## 2022-03-29 MED ORDER — TIZANIDINE HCL 4 MG PO TABS
4.0000 mg | ORAL_TABLET | Freq: Three times a day (TID) | ORAL | 0 refills | Status: DC | PRN
  Filled 2022-03-29: qty 30, 10d supply, fill #0

## 2022-03-30 ENCOUNTER — Other Ambulatory Visit (HOSPITAL_COMMUNITY): Payer: Self-pay

## 2022-03-30 MED ORDER — TRAMADOL HCL 50 MG PO TABS
50.0000 mg | ORAL_TABLET | Freq: Four times a day (QID) | ORAL | 0 refills | Status: DC | PRN
  Filled 2022-03-30: qty 40, 10d supply, fill #0

## 2022-03-31 ENCOUNTER — Other Ambulatory Visit (HOSPITAL_COMMUNITY): Payer: Self-pay

## 2022-04-06 ENCOUNTER — Ambulatory Visit (INDEPENDENT_AMBULATORY_CARE_PROVIDER_SITE_OTHER): Payer: Commercial Managed Care - HMO | Admitting: Psychiatry

## 2022-04-06 DIAGNOSIS — F4310 Post-traumatic stress disorder, unspecified: Secondary | ICD-10-CM | POA: Diagnosis not present
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
## 2022-04-18 ENCOUNTER — Other Ambulatory Visit (HOSPITAL_COMMUNITY): Payer: Self-pay

## 2022-04-19 ENCOUNTER — Other Ambulatory Visit (HOSPITAL_COMMUNITY): Payer: Self-pay

## 2022-04-22 ENCOUNTER — Other Ambulatory Visit (HOSPITAL_COMMUNITY): Payer: Self-pay

## 2022-04-27 ENCOUNTER — Other Ambulatory Visit (HOSPITAL_COMMUNITY): Payer: Self-pay

## 2022-04-27 ENCOUNTER — Ambulatory Visit (INDEPENDENT_AMBULATORY_CARE_PROVIDER_SITE_OTHER): Payer: Commercial Managed Care - HMO | Admitting: Psychiatry

## 2022-04-27 DIAGNOSIS — F4310 Post-traumatic stress disorder, unspecified: Secondary | ICD-10-CM

## 2022-04-27 NOTE — Progress Notes (Signed)
[facility] Counselor/Therapist Progress Note  Patient ID: Shannon Swanson, MRN: [id number],    Date: 04/27/2022  Time Spent: 50 minutes start time 11:09 AM end time 11:59 AM  Treatment Type: Individual Therapy  Reported Symptoms: anxiety, focusing issues, difficulty starting and completing tasks, overwhelmed, panic, sleep issues, fatigue, chronic pain, triggered responses  Mental Status Exam:  Appearance:   Well Groomed     Behavior:  Appropriate  Motor:  Normal  Speech/Language:   Normal Rate  Affect:  Appropriate tearful  Mood:  anxious sadness  Thought process:  Difficulty with processing  Thought content:    WNL  Sensory/Perceptual disturbances:    WNL  Orientation:  oriented to person, place, time/date, and situation  Attention:  Good  Concentration:  fair  Memory:  Immediate;   [place] of knowledge:   Good  Insight:    Good  Judgment:   Good  Impulse Control:  Good   Risk Assessment: Danger to Self:  No Self-injurious Behavior: No Danger to Others: No Duty to Warn:no Physical Aggression / Violence:No  Access to Firearms a concern: No  Gang Involvement:No   Subjective: Patient was present for session.  She shared she was able to get all of her things together for her taxes which was huge for her.  She stated she is having a hard time getting started on those tasks but she was able to get started and had to just stay up all night and complete it.  She has been off her meds for a week and a half due to pharmacy not having it. She is trying to use noise at night and it has helped but it is still not great.  Patient was tearful as she shared that the whole situation with [employer]. was overwhelming.  She shared she is just ready to get to the other side because she does not know what she is going to do financially and she feels she cannot move on with her life until she has a plan.  Patient stated she is realizing she will not get enough money to be able to  live very long from [employer]. and is not sure what she will do since she cannot work on a computer long-term do much movement cannot lift and feels very limited.  Patient shared that it had been indicated she may want to apply for disability which feels like a failure to her.  Encouraged her to realize does not that she is a failure but her limitations are what they are and she may need to start looking at other options.  Patient agreed to talk to her attorney more about that possibility.  Interventions: Solution-Oriented/Positive Psychology  Diagnosis:   ICD-10-CM   1. Post traumatic stress disorder (PTSD)  F43.10       Plan: Patient is to use CBT and coping skills to decrease triggered responses.  Patient is to start talking with her attorney about the possibility of applying for disability.  Patient is to contact her church to find activities that she can be engaged in that keep her from ruminating on negative thoughts.  Patient is to work on breaking things up into small pieces that she feels like she can accomplish.  Patient is to work on a daily routine and trying to develop healthy sleep patterns and hygiene.  Patient is to look at podcast by Dr. Tawanna Solo on neuro cycle changes.  Patient is to work on reminding  herself that she is enough and still has a purpose even when she is unable to work.  She is to try and focus on concrete things that she can still do.  Patient is to continue working with providers on her medical issues.  Patient is to take medication as directed Long-term goal: Recall the traumatic event without becoming overwhelming negative emotions Short-term goal: Practice implement relaxation training as a coping mechanism for tension panic stress anger and anxiety  Lina Sayre, [facility]

## 2022-05-12 ENCOUNTER — Other Ambulatory Visit (HOSPITAL_COMMUNITY): Payer: Self-pay

## 2022-05-13 ENCOUNTER — Other Ambulatory Visit (HOSPITAL_COMMUNITY): Payer: Self-pay

## 2022-05-17 ENCOUNTER — Other Ambulatory Visit (HOSPITAL_COMMUNITY): Payer: Self-pay

## 2022-05-17 MED ORDER — MELOXICAM 7.5 MG PO TABS
7.5000 mg | ORAL_TABLET | Freq: Two times a day (BID) | ORAL | 0 refills | Status: DC
  Filled 2022-05-17: qty 60, 30d supply, fill #0

## 2022-05-17 MED ORDER — TIZANIDINE HCL 4 MG PO TABS
4.0000 mg | ORAL_TABLET | Freq: Three times a day (TID) | ORAL | 0 refills | Status: DC | PRN
  Filled 2022-05-17: qty 30, 10d supply, fill #0

## 2022-05-18 ENCOUNTER — Other Ambulatory Visit (HOSPITAL_COMMUNITY): Payer: Self-pay

## 2022-05-24 ENCOUNTER — Ambulatory Visit (INDEPENDENT_AMBULATORY_CARE_PROVIDER_SITE_OTHER): Payer: Commercial Managed Care - HMO | Admitting: Psychiatry

## 2022-05-24 ENCOUNTER — Other Ambulatory Visit (HOSPITAL_COMMUNITY): Payer: Self-pay

## 2022-05-24 DIAGNOSIS — F322 Major depressive disorder, single episode, severe without psychotic features: Secondary | ICD-10-CM

## 2022-05-24 NOTE — Progress Notes (Signed)
[facility] Counselor/Therapist Progress Note  Patient ID: Shannon Swanson, MRN: [id number],    Date: 05/24/2022  Time Spent: 48 minutes 10:16 AM end time 11:04 AM  Treatment Type: Individual Therapy  Reported Symptoms: anxiety, sadness, memory issues, focusing issues, triggered responses, sleep issues, chronic pain issues, fatigue, intrusive thoughts  Mental Status Exam:  Appearance:   Casual and Neat     Behavior:  Appropriate  Motor:  Normal  Speech/Language:   Normal Rate  Affect:  Appropriate  Mood:  anxious and sad  Thought process:  normal  Thought content:    WNL  Sensory/Perceptual disturbances:    WNL  Orientation:  oriented to person, place, time/date, and situation  Attention:  Good  Concentration:  Good  Memory:  Immediate;   [place] of knowledge:   Good  Insight:    Good  Judgment:   Good  Impulse Control:  Good   Risk Assessment: Danger to Self:  No Self-injurious Behavior: No Danger to Others: No Duty to Warn:no Physical Aggression / Violence:No  Access to Firearms a concern: No  Gang Involvement:No   Subjective: Patient was present for session.  She was late and she reported getting going is difficult for her.  She went on to share focusing and memory are big issues for her currently.  She shared she is very frustrated with herself due to not being able to do what she used to do and get her room cleaned the way that she needs to due to her injuries as well as her focusing issues.  Patient was encouraged to start recognizing her situation is difficult but it does not mean it is going to be forever.  She reported her returning had discussed the possibility of disability for her which was very upsetting.  Patient was encouraged to recognize that that may be what she needs for right now but it does not mean that it would be forever.  She was encouraged to realize she still may have more surgeries and that could help deal with some of her chronic pain  issues in the future but if she needs disability right now because she cannot work and that is an okay thing.  Patient was encouraged to try and work on her self talk and come to peace with her current situation is difficult as it is.  She was encouraged to continue finding ways or things that she can accomplish and trying to focus on putting the right stuff into her head.  Patient was encouraged to watch some podcast by Dr. Tawanna Solo and to work on changing her neuro cycle.  Interventions: Cognitive Behavioral Therapy and Solution-Oriented/Positive Psychology  Diagnosis:   ICD-10-CM   1. Current severe episode of major depressive disorder without psychotic features without prior episode ([place])  F32.2       Plan: Patient is to use CBT and coping skills to decrease triggered responses.    Patient is to contact her church to find activities that she can be engaged in that keep her from ruminating on negative thoughts.  Patient is to work on breaking things up into small pieces that she feels like she can accomplish.  Patient is to work on a daily routine and trying to develop healthy sleep patterns and hygiene.  Patient is to look at podcast by Dr. Tawanna Solo on neuro cycle changes.  Patient is to work on reminding herself that she is enough and still has a purpose even  when she is unable to work.  She is to try and focus on concrete things that she can still do.  Patient is to continue working with providers on her medical issues.  Patient is to take medication as directed Long-term goal: Recall the traumatic event without becoming overwhelming negative emotions Short-term goal: Practice implement relaxation training as a coping mechanism for tension panic stress anger and anxiety    Lina Sayre, [facility]

## 2022-05-25 ENCOUNTER — Other Ambulatory Visit (HOSPITAL_COMMUNITY): Payer: Self-pay

## 2022-05-25 MED ORDER — TRAMADOL HCL 50 MG PO TABS
50.0000 mg | ORAL_TABLET | Freq: Four times a day (QID) | ORAL | 0 refills | Status: DC | PRN
  Filled 2022-05-25: qty 40, 10d supply, fill #0

## 2022-06-03 ENCOUNTER — Other Ambulatory Visit (HOSPITAL_COMMUNITY): Payer: Self-pay

## 2022-06-14 ENCOUNTER — Ambulatory Visit: Payer: Commercial Managed Care - HMO | Admitting: Psychiatry

## 2022-06-15 ENCOUNTER — Other Ambulatory Visit (HOSPITAL_COMMUNITY): Payer: Self-pay

## 2022-06-15 MED ORDER — ONDANSETRON HCL 8 MG PO TABS
8.0000 mg | ORAL_TABLET | Freq: Two times a day (BID) | ORAL | 0 refills | Status: DC
  Filled 2022-06-15: qty 30, 15d supply, fill #0

## 2022-06-21 ENCOUNTER — Ambulatory Visit: Payer: Commercial Managed Care - HMO | Admitting: Psychiatry

## 2022-06-22 ENCOUNTER — Other Ambulatory Visit (HOSPITAL_COMMUNITY): Payer: Self-pay

## 2022-06-23 ENCOUNTER — Other Ambulatory Visit (HOSPITAL_COMMUNITY): Payer: Self-pay

## 2022-06-24 ENCOUNTER — Other Ambulatory Visit (HOSPITAL_COMMUNITY): Payer: Self-pay

## 2022-06-24 MED ORDER — ONDANSETRON HCL 8 MG PO TABS
8.0000 mg | ORAL_TABLET | Freq: Two times a day (BID) | ORAL | 0 refills | Status: DC
  Filled 2022-06-24: qty 30, 15d supply, fill #0

## 2022-07-05 ENCOUNTER — Other Ambulatory Visit (HOSPITAL_COMMUNITY): Payer: Self-pay

## 2022-07-06 ENCOUNTER — Other Ambulatory Visit (HOSPITAL_COMMUNITY): Payer: Self-pay

## 2022-07-08 ENCOUNTER — Other Ambulatory Visit (HOSPITAL_COMMUNITY): Payer: Self-pay

## 2022-07-13 ENCOUNTER — Other Ambulatory Visit (HOSPITAL_COMMUNITY): Payer: Self-pay

## 2022-07-13 MED ORDER — ONDANSETRON HCL 8 MG PO TABS
8.0000 mg | ORAL_TABLET | ORAL | 0 refills | Status: DC
  Filled 2022-07-13: qty 30, 15d supply, fill #0

## 2022-07-14 ENCOUNTER — Other Ambulatory Visit (HOSPITAL_COMMUNITY): Payer: Self-pay

## 2022-07-18 ENCOUNTER — Other Ambulatory Visit (HOSPITAL_COMMUNITY): Payer: Self-pay

## 2022-07-19 ENCOUNTER — Other Ambulatory Visit (HOSPITAL_COMMUNITY): Payer: Self-pay

## 2022-07-21 ENCOUNTER — Ambulatory Visit (INDEPENDENT_AMBULATORY_CARE_PROVIDER_SITE_OTHER): Payer: 59 | Admitting: Psychiatry

## 2022-07-21 ENCOUNTER — Other Ambulatory Visit (HOSPITAL_COMMUNITY): Payer: Self-pay

## 2022-07-21 DIAGNOSIS — F4310 Post-traumatic stress disorder, unspecified: Secondary | ICD-10-CM

## 2022-07-21 DIAGNOSIS — R69 Illness, unspecified: Secondary | ICD-10-CM | POA: Diagnosis not present

## 2022-07-21 NOTE — Progress Notes (Signed)
[facility] Counselor/Therapist Progress Note  Patient ID: Shannon Swanson, MRN: [id number],    Date: 07/21/2022  Time Spent: 50 minutes start time 9:17 AM end time 10:07 AM  Treatment Type: Individual Therapy  Reported Symptoms: anxiety, triggered responses, sadness, chronic pain issues, panic, nightmares, focusing issues, irritability, fatigue   Mental Status Exam:  Appearance:   Casual and Neat     Behavior:  Appropriate  Motor:  Normal  Speech/Language:   Normal Rate  Affect:  Appropriate  Mood:  anxious  Thought process:  normal  Thought content:    WNL  Sensory/Perceptual disturbances:    WNL  Orientation:  oriented to person, place, time/date, and situation  Attention:  Good  Concentration:  Good  Memory:  WNL  Fund of knowledge:   Good  Insight:    Good  Judgment:   Good  Impulse Control:  Good   Risk Assessment: Danger to Self:  No Self-injurious Behavior: No Danger to Others: No Duty to Warn:no Physical Aggression / Violence:No  Access to Firearms a concern: No  Gang Involvement:No   Subjective: Patient was present for session. She shared that her mother had a stroke over Christmas but she is doing okay right now. 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 She shared she struggled with even getting to session due to the fatigue and anxiety of having to come to appointment. The cold and rain are also increasing the pain in her neck.  Patient stated that rather than processing anything she wanted coping skills to help manage her emotions and trying to function more appropriately.  Patient was given multiple handouts on cognitive distortions and how to deal with them appropriately.  She was also given a handout on the CBT triangle and how to apply that to her situation as well as the ST OP P method so she can practice and utilize that appropriately as well.  Patient reported feeling that she could implement strategies  discussed in session and that the handouts would be very helpful to her she agreed to follow through on plans.  Interventions: Cognitive Behavioral Therapy  Diagnosis:   ICD-10-CM   1. Post traumatic stress disorder (PTSD)  F43.10       Plan: Patient is to use CBT and coping skills to decrease triggered responses.  Patient is to follow through with working on the handouts on CBT skills that were reviewed and given to her in session and apply strategies discussed..  Patient is to contact her church to find activities that she can be engaged in that keep her from ruminating on negative thoughts.  Patient is to work on breaking things up into small pieces that she feels like she can accomplish.  Patient is to work on a daily routine and trying to develop healthy sleep patterns and hygiene.  Patient is to look at podcast by Dr. Tawanna Solo on neuro cycle changes.  Patient is to work on reminding herself that she is enough and still has a purpose even when she is unable to work.  She is to try and focus on concrete things that she can still do.  Patient is to continue working with providers on her medical issues.  Patient is to take medication as directed Long-term goal: Recall the traumatic event without becoming overwhelming negative emotions Short-term goal: Practice implement relaxation training as a coping mechanism for tension panic stress anger and  anxiety    Lina Sayre, [facility]

## 2022-08-02 DIAGNOSIS — Z6826 Body mass index (BMI) 26.0-26.9, adult: Secondary | ICD-10-CM | POA: Diagnosis not present

## 2022-08-02 DIAGNOSIS — M542 Cervicalgia: Secondary | ICD-10-CM | POA: Diagnosis not present

## 2022-08-03 ENCOUNTER — Other Ambulatory Visit (HOSPITAL_COMMUNITY): Payer: Self-pay

## 2022-08-03 MED ORDER — MELOXICAM 7.5 MG PO TABS
7.5000 mg | ORAL_TABLET | Freq: Two times a day (BID) | ORAL | 0 refills | Status: DC
  Filled 2022-08-03 – 2022-08-25 (×2): qty 60, 30d supply, fill #0

## 2022-08-03 MED ORDER — TIZANIDINE HCL 4 MG PO TABS
4.0000 mg | ORAL_TABLET | Freq: Three times a day (TID) | ORAL | 0 refills | Status: DC | PRN
  Filled 2022-08-03 – 2022-08-25 (×2): qty 30, 10d supply, fill #0

## 2022-08-04 ENCOUNTER — Encounter: Payer: Self-pay | Admitting: Psychiatry

## 2022-08-04 ENCOUNTER — Other Ambulatory Visit (HOSPITAL_COMMUNITY): Payer: Self-pay

## 2022-08-04 ENCOUNTER — Ambulatory Visit (INDEPENDENT_AMBULATORY_CARE_PROVIDER_SITE_OTHER): Payer: 59 | Admitting: Psychiatry

## 2022-08-04 DIAGNOSIS — F411 Generalized anxiety disorder: Secondary | ICD-10-CM | POA: Diagnosis not present

## 2022-08-04 DIAGNOSIS — F900 Attention-deficit hyperactivity disorder, predominantly inattentive type: Secondary | ICD-10-CM | POA: Diagnosis not present

## 2022-08-04 DIAGNOSIS — R69 Illness, unspecified: Secondary | ICD-10-CM | POA: Diagnosis not present

## 2022-08-04 DIAGNOSIS — F4310 Post-traumatic stress disorder, unspecified: Secondary | ICD-10-CM | POA: Diagnosis not present

## 2022-08-04 DIAGNOSIS — F422 Mixed obsessional thoughts and acts: Secondary | ICD-10-CM | POA: Diagnosis not present

## 2022-08-04 DIAGNOSIS — F322 Major depressive disorder, single episode, severe without psychotic features: Secondary | ICD-10-CM

## 2022-08-04 MED ORDER — METHYLPHENIDATE HCL 5 MG PO TABS
5.0000 mg | ORAL_TABLET | Freq: Three times a day (TID) | ORAL | 0 refills | Status: DC
  Filled 2022-08-04 – 2022-08-25 (×2): qty 90, 30d supply, fill #0

## 2022-08-04 MED ORDER — ESCITALOPRAM OXALATE 5 MG/5ML PO SOLN
20.0000 mg | Freq: Every day | ORAL | 1 refills | Status: DC
  Filled 2022-08-04: qty 600, 30d supply, fill #0

## 2022-08-04 MED ORDER — DIAZEPAM 2 MG PO TABS
ORAL_TABLET | ORAL | 1 refills | Status: DC
  Filled 2022-08-04 – 2022-08-25 (×2): qty 30, 15d supply, fill #0
  Filled 2023-01-17: qty 30, 15d supply, fill #1
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
## 2022-08-16 ENCOUNTER — Other Ambulatory Visit (HOSPITAL_COMMUNITY): Payer: Self-pay

## 2022-08-18 ENCOUNTER — Ambulatory Visit (INDEPENDENT_AMBULATORY_CARE_PROVIDER_SITE_OTHER): Payer: 59 | Admitting: Psychiatry

## 2022-08-18 DIAGNOSIS — F4310 Post-traumatic stress disorder, unspecified: Secondary | ICD-10-CM | POA: Diagnosis not present

## 2022-08-18 DIAGNOSIS — R69 Illness, unspecified: Secondary | ICD-10-CM | POA: Diagnosis not present

## 2022-08-18 NOTE — Progress Notes (Signed)
[facility] Counselor/Therapist Progress Note  Patient ID: Shannon Swanson, MRN: [id number],    Date: 08/18/2022  Time Spent: 50 minutes start time 9:07 AM end time 9:57 AM  Treatment Type: Individual Therapy  Reported Symptoms: pain issues, sadness, nightmares, flashbacks, anxiety, sleep issues, fatigue, focusing issues, motivation issues  Mental Status Exam:  Appearance:   Well Groomed     Behavior:  Appropriate  Motor:  Normal  Speech/Language:   Normal Rate  Affect:  Appropriate  Mood:  normal  Thought process:  normal  Thought content:    WNL  Sensory/Perceptual disturbances:    pain  Orientation:  oriented to person, place, time/date, and situation  Attention:  Good  Concentration:  Good  Memory:  WNL  Fund of knowledge:   Good  Insight:    Good  Judgment:   Good  Impulse Control:  Good   Risk Assessment: Danger to Self:  No Self-injurious Behavior: No Danger to Others: No Duty to Warn:no Physical Aggression / Violence:No  Access to Firearms a concern: No  Gang Involvement:No   Subjective: Patient was present for session. She shared she was feeling good at the moment due to having plans after session. She also found out that her daughter is having a boy.  She shared she has been having nightmares but she can't remember details of them. She shared she went to [facility] 08/17/2022 but she couldn't focus due to the pain in her arm and neck.  Patient explained she is still struggling with feeling like she is enough and getting through the situation with Workmen's Comp.  Patient explained she is wanting to move forward with her life but feels very stuck and trapped in her pain.  Patient stated she tries to do something and it just makes it worse at home so then she does not want to work as much but then cycles negatively which she knows is not healthy for her as well.  Had patient think through a plan to help keep her thoughts moving in a more positive direction and  affirming herself regularly that she is enough.  Patient was encouraged to start each day with gratitude and focusing on what her purposes for that day and what she can accomplish.  Patient is also to reach out to someone each day so that she has contact with others and does not get stuck in her own thoughts.  She was also encouraged to think through and research foods that can help when you have chronic pain issues to see what sorts of diets people are having the most benefit from.  All plans were written down on index card for patient to refer back to over the next week.  Interventions: Cognitive Behavioral Therapy and Solution-Oriented/Positive Psychology  Diagnosis:   ICD-10-CM   1. Post traumatic stress disorder (PTSD)  F43.10       Plan:  Patient is to use CBT and coping skills to decrease triggered responses.  Patient is to follow plans on index card that were written out and handed to her at end of session.  Patient is to follow through with working on the handouts on CBT skills that were reviewed and given to her in previous session. Patient is to work on breaking things up into small pieces that she feels like she can accomplish.  Patient is to work on a daily routine and trying to develop healthy sleep patterns and hygiene.  Patient is to look at podcast  by Dr. Tawanna Solo on neuro cycle changes.  Patient is to work on reminding herself that she is enough and still has a purpose even when she is unable to work.  She is to try and focus on concrete things that she can still do.  Patient is to continue working with providers on her medical issues.  Patient is to take medication as directed Long-term goal: Recall the traumatic event without becoming overwhelming negative emotions Short-term goal: Practice implement relaxation training as a coping mechanism for tension panic stress anger and anxiety  Shannon Swanson, [facility]

## 2022-08-25 ENCOUNTER — Other Ambulatory Visit (HOSPITAL_COMMUNITY): Payer: Self-pay

## 2022-08-30 ENCOUNTER — Other Ambulatory Visit (HOSPITAL_COMMUNITY): Payer: Self-pay

## 2022-09-15 ENCOUNTER — Ambulatory Visit (INDEPENDENT_AMBULATORY_CARE_PROVIDER_SITE_OTHER): Payer: 59 | Admitting: Psychiatry

## 2022-09-15 DIAGNOSIS — F4310 Post-traumatic stress disorder, unspecified: Secondary | ICD-10-CM

## 2022-09-15 NOTE — Progress Notes (Signed)
[facility] Counselor/Therapist Progress Note  Patient ID: APREL GLASBY, MRN: [id number],    Date: 09/15/2022  Time Spent: 49 minutes start time 10:11 AM end time 11 AM  Treatment Type: Individual Therapy  Reported Symptoms: triggered responses, anxiety, sadness, crying spells, sleep issues,panic, rumination  Mental Status Exam:  Appearance:   Well Groomed     Behavior:  Appropriate  Motor:  Normal  Speech/Language:   Normal Rate  Affect:  Appropriate  Mood:  anxious  Thought process:  normal  Thought content:    WNL  Sensory/Perceptual disturbances:    WNL  Orientation:  oriented to person, place, time/date, and situation  Attention:  Good  Concentration:  Good  Memory:  WNL  Fund of knowledge:   Good  Insight:    Good  Judgment:   Good  Impulse Control:  Good   Risk Assessment: Danger to Self:  No Self-injurious Behavior: No Danger to Others: No Duty to Warn:no Physical Aggression / Violence:No  Access to Firearms a concern: No  Gang Involvement:No   Subjective: Patient was present for session. She shared that things are still in progress with her workmen's  comp. She reported she had a situation that she felt was not ethical with the vocational rehab person.  She exists explained that she set up an account on [employer] for patient on her computer and the password was saved to her computer which was very concerning for patient.  Discussed the possibility of going ahead and changing the password so that she feels more secure with the situation.  Patient was tearful as she shared the difficulty she is having with knowing she still has multiple restrictions regarding the amount of hours she can work and jobs that she needs to apply for have more expectations.  Patient was tearful as she shared her frustration and how she feels her self-esteem has been impacted negatively by the whole situation.  Patient was encouraged to get back to focusing on what she can control fix  and change and to remind herself that she still can do things even though she may not be able to do what she wants to do and have the skills that she needs to work a full-time job.  Interventions: Solution-Oriented/Positive Psychology  Diagnosis:   ICD-10-CM   1. Post traumatic stress disorder (PTSD)  F43.10       Plan: Patient is to use CBT and coping skills to decrease triggered responses.    Patient is to continue working on the handouts on CBT skills that were reviewed and given to her in previous session. Patient is to work on breaking things up into small pieces that she feels like she can accomplish.  Patient is to work on a daily routine and trying to develop healthy sleep patterns and hygiene.  Patient is to look at podcast by Dr. Tawanna Solo on neuro cycle changes.  Patient is to work on reminding herself that she is enough and still has a purpose even when she is unable to work.  She is to try and focus on concrete things that she can still do.  Patient is to continue working with providers on her medical issues.  Patient is to take medication as directed Long-term goal: Recall the traumatic event without becoming overwhelming negative emotions Short-term goal: Practice implement relaxation training as a coping mechanism for tension panic stress anger and anxiety  Lina Sayre, [facility]

## 2022-10-13 ENCOUNTER — Ambulatory Visit (INDEPENDENT_AMBULATORY_CARE_PROVIDER_SITE_OTHER): Payer: 59 | Admitting: Psychiatry

## 2022-10-13 DIAGNOSIS — F4310 Post-traumatic stress disorder, unspecified: Secondary | ICD-10-CM

## 2022-10-13 NOTE — Progress Notes (Signed)
[facility] Counselor/Therapist Progress Note  Patient ID: Shannon Swanson, MRN: [id number],    Date: 10/13/2022  Time Spent: 50 minutes start time 9:12 AM end time 10:02 AM  Treatment Type: Individual Therapy  Reported Symptoms: anxiety, sadness, sleep issues, nightmares, energy, chronic pain issues, rumination, intrusive thoughts  Mental Status Exam:  Appearance:   Well Groomed     Behavior:  Appropriate  Motor:  Normal  Speech/Language:   Normal Rate  Affect:  Appropriate tearful  Mood:  anxious sad  Thought process:  normal  Thought content:    WNL  Sensory/Perceptual disturbances:    WNL  Orientation:  oriented to person, place, time/date, and situation  Attention:  Good  Concentration:  Good  Memory:  WNL  Fund of knowledge:   Good  Insight:    Good  Judgment:   Good  Impulse Control:  Good   Risk Assessment: Danger to Self:  No Self-injurious Behavior: No Danger to Others: No Duty to Warn:no Physical Aggression / Violence:No  Access to Firearms a concern: No  Gang Involvement:No   Subjective: Patient was present for session.  She shared she has been having nightmares even though she is having some positive hours of sleep in 2 hour intervals. Discussed how she is having to deal with 1 more things after another and that is overwhelming for her. She shared that she has had a hard time this winter and is concerned about next winter.  Patient was encouraged to recognize that until she is completely able to put her past life behind her she cannot move forward with her current and future potential.  Patient shared they are hoping to have things wrapped up soon with her workmen's comp and then she will know what she has to work with her what she has to do for the future whether is disability or what ever else she needs to be able to make it.  Patient was encouraged to feel good about that possibility because the cycling of being stuck where she is currently has not been  helpful for her.  Patient was encouraged to recognize that she is God to take her perceptions in her head of what she has always wanted to do and be more realistic about what she can do for her future.  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  Patient was encouraged to start giving herself opportunity to think about what may be realistic even if it is just for a few hours a few days a week if it is something that she actually can do it will be easier for her brain to adjust and move forward with her life.  Patient's emotions and sadness over the situation were acknowledged and patient was encouraged to try and stay focused on what she can control fix and change and continue to remind herself that she still has a purpose that she is still here and she can get to the other side of the situation.  Patient was encouraged to start looking on monitor finding books that could help her think about different possibilities for her.  Interventions: Cognitive Behavioral Therapy and Solution-Oriented/Positive Psychology  Diagnosis:   ICD-10-CM   1. Post traumatic stress disorder (PTSD)  F43.10  Plan: Patient is to use CBT and coping skills to decrease triggered responses.    Patient is to work on trying to change her perceptions of what her life can be like to be more realistic to her situation and remind herself she still has purpose and there are things she can still do.  Patient is to work on breaking things up into small pieces that she feels like she can accomplish.  Patient is to work on a daily routine and trying to develop healthy sleep patterns and hygiene.  Patient is to look at podcast by Dr. Tawanna Solo on neuro cycle changes.  Patient is to work on reminding herself that she is enough and still has a purpose even when  she is unable to work.  She is to try and focus on concrete things that she can still do.  Patient is to continue working with providers on her medical issues.  Patient is to take medication as directed Long-term goal: Recall the traumatic event without becoming overwhelming negative emotions Short-term goal: Practice implement relaxation training as a coping mechanism for tension panic stress anger and anxiety  Lina Sayre, [facility]

## 2022-11-21 ENCOUNTER — Ambulatory Visit: Payer: 59 | Admitting: Psychiatry

## 2022-11-22 ENCOUNTER — Other Ambulatory Visit (HOSPITAL_COMMUNITY): Payer: Self-pay

## 2022-11-22 DIAGNOSIS — M542 Cervicalgia: Secondary | ICD-10-CM | POA: Diagnosis not present

## 2022-11-22 DIAGNOSIS — I651 Occlusion and stenosis of basilar artery: Secondary | ICD-10-CM | POA: Diagnosis not present

## 2022-11-22 DIAGNOSIS — I6509 Occlusion and stenosis of unspecified vertebral artery: Secondary | ICD-10-CM | POA: Diagnosis not present

## 2022-11-22 MED ORDER — MELOXICAM 7.5 MG PO TABS
7.5000 mg | ORAL_TABLET | Freq: Two times a day (BID) | ORAL | 0 refills | Status: DC
  Filled 2022-11-22 – 2022-12-06 (×2): qty 60, 30d supply, fill #0

## 2022-11-22 MED ORDER — TIZANIDINE HCL 4 MG PO TABS
4.0000 mg | ORAL_TABLET | Freq: Three times a day (TID) | ORAL | 0 refills | Status: DC | PRN
  Filled 2022-11-22 – 2022-12-06 (×2): qty 30, 10d supply, fill #0

## 2022-11-22 MED ORDER — TRAMADOL HCL 50 MG PO TABS
50.0000 mg | ORAL_TABLET | Freq: Four times a day (QID) | ORAL | 0 refills | Status: DC | PRN
  Filled 2022-11-22 – 2022-12-06 (×2): qty 40, 10d supply, fill #0

## 2022-11-25 ENCOUNTER — Other Ambulatory Visit: Payer: Self-pay | Admitting: Neurosurgery

## 2022-11-25 DIAGNOSIS — I6509 Occlusion and stenosis of unspecified vertebral artery: Secondary | ICD-10-CM

## 2022-11-25 DIAGNOSIS — M542 Cervicalgia: Secondary | ICD-10-CM

## 2022-11-30 ENCOUNTER — Other Ambulatory Visit (HOSPITAL_COMMUNITY): Payer: Self-pay

## 2022-11-30 ENCOUNTER — Ambulatory Visit: Payer: 59 | Admitting: Psychiatry

## 2022-12-01 ENCOUNTER — Ambulatory Visit: Payer: 59 | Admitting: Psychiatry

## 2022-12-06 ENCOUNTER — Telehealth: Payer: Self-pay | Admitting: Psychiatry

## 2022-12-06 ENCOUNTER — Other Ambulatory Visit (HOSPITAL_COMMUNITY): Payer: Self-pay

## 2022-12-06 MED ORDER — ESCITALOPRAM OXALATE 20 MG PO TABS
20.0000 mg | ORAL_TABLET | Freq: Every day | ORAL | 0 refills | Status: DC
  Filled 2022-12-06: qty 30, 30d supply, fill #0

## 2022-12-06 NOTE — Telephone Encounter (Signed)
Pt called requesting lexapro generic  20 mg small tablet. Usually takes liquid. Has emergency has to fly out today to [facility] family member sick. PLEASE FILL ASAP. Apt 6/12 [facility].

## 2022-12-06 NOTE — Telephone Encounter (Signed)
Sent!

## 2022-12-09 ENCOUNTER — Other Ambulatory Visit: Payer: Self-pay

## 2022-12-21 ENCOUNTER — Other Ambulatory Visit (HOSPITAL_COMMUNITY): Payer: Self-pay

## 2022-12-21 ENCOUNTER — Encounter: Payer: Self-pay | Admitting: Psychiatry

## 2022-12-21 ENCOUNTER — Ambulatory Visit (INDEPENDENT_AMBULATORY_CARE_PROVIDER_SITE_OTHER): Payer: 59 | Admitting: Psychiatry

## 2022-12-21 VITALS — BP 118/76 | HR 90

## 2022-12-21 DIAGNOSIS — F4310 Post-traumatic stress disorder, unspecified: Secondary | ICD-10-CM | POA: Diagnosis not present

## 2022-12-21 DIAGNOSIS — F411 Generalized anxiety disorder: Secondary | ICD-10-CM | POA: Diagnosis not present

## 2022-12-21 DIAGNOSIS — F422 Mixed obsessional thoughts and acts: Secondary | ICD-10-CM

## 2022-12-21 DIAGNOSIS — F320 Major depressive disorder, single episode, mild: Secondary | ICD-10-CM | POA: Diagnosis not present

## 2022-12-21 DIAGNOSIS — F900 Attention-deficit hyperactivity disorder, predominantly inattentive type: Secondary | ICD-10-CM

## 2022-12-21 DIAGNOSIS — F322 Major depressive disorder, single episode, severe without psychotic features: Secondary | ICD-10-CM

## 2022-12-21 MED ORDER — ESCITALOPRAM OXALATE 20 MG PO TABS
20.0000 mg | ORAL_TABLET | Freq: Every day | ORAL | 0 refills | Status: DC
Start: 2022-12-21 — End: 2023-03-23
  Filled 2022-12-21: qty 90, 90d supply, fill #0
  Filled 2023-01-17: qty 30, 30d supply, fill #0
  Filled 2023-03-06: qty 30, 30d supply, fill #1

## 2022-12-21 MED ORDER — L-METHYLFOLATE 15 MG PO TABS
15.0000 mg | ORAL_TABLET | Freq: Every day | ORAL | 5 refills | Status: DC
Start: 2022-12-21 — End: 2023-03-24
  Filled 2022-12-21: qty 30, 30d supply, fill #0
  Filled 2023-01-17: qty 90, 90d supply, fill #0
  Filled 2023-01-21: qty 30, 30d supply, fill #0

## 2022-12-21 MED ORDER — METHYLPHENIDATE HCL 5 MG PO TABS
5.0000 mg | ORAL_TABLET | Freq: Three times a day (TID) | ORAL | 0 refills | Status: DC
Start: 2022-12-21 — End: 2023-03-06
  Filled 2022-12-21 – 2023-01-17 (×2): qty 90, 30d supply, fill #0

## 2022-12-21 NOTE — Progress Notes (Signed)
Shannon Swanson [id number] 09-09-1970 52 y.o.  Subjective:   Patient ID:  Shannon Swanson is a 52 y.o. (DOB Jun 09, 1971) female.  Chief Complaint:  Chief Complaint  Patient presents with   Follow-up    Anxiety, depression, ADHD    HPI Shannon Swanson presents to the office today for follow-up of anxiety, ADHD, and depression.   She just returned last night from emergency trip to see daughter in [place] after daughter had placental abruption and had baby at [redacted] weeks gestation. Baby is now doing ok and will be in hospital for awhile longer. Daughter had to have several blood transfusions. Daughter also has a 54 yo son. She reports that she was not able to lift grandchild and had to tell him not to pull on her arm. She reports that at times she did not agree with discipline approaches and this caused her some stress. She reports some generalized anxiety with worry and rumination. She reports some occasional catastrophic thoughts.   She reports that she slept better last night with getting back home. She reports that her sleep is "hit or miss all the time." She reports that she will occasionally fall asleep unexpectedly due to exhaustion. Energy has been less compared to baseline prior to injury. "I think when I stay busy, it helps me mentally, but there's only so much I can do." Motivation is fair. She reports depression is "definitely still there." She reports occasional crying episodes and has been more tearful. She reports poor concentration. She reports using Ritalin some. Appetite varies- "sometimes don't feel like eating and would rather go to sleep." Denies SI.   She reports that worker's comp case has been settled. She will have scans (CT and MRA) tomorrow and will learn results next week. She reports worsening pain with increased activity.   She reports that she has taken a 1/2 tab of Diazepam on a few occasions and that this seemed to be helpful.   Ritalin last filled  08/25/22. Diazepam last filled 08/25/22  Past Psychiatric Medication Trials: Lexapro Wellbutrin SR-side effects Wellbutrin XL-side effects Diazepam- Took x 1. Ritalin Methylphenidate ER   Flowsheet Row Admission (Discharged) from 12/01/2021 in [facility] ED from 11/20/2021 in [facility] Emergency Department at [facility] Admission (Discharged) from 09/07/2020 in [facility]  C-SSRS RISK CATEGORY No Risk No Risk No Risk        Review of Systems:  Review of Systems  Cardiovascular:  Negative for palpitations.  Gastrointestinal:  Positive for nausea.  Musculoskeletal:  Positive for neck pain. Negative for gait problem.       Shoulder, arm, and hand pain. Rare back pain  Neurological:  Positive for numbness. Negative for tremors.  Psychiatric/Behavioral:         Please refer to HPI    Medications: I have reviewed the patient's current medications.  Current Outpatient Medications  Medication Sig Dispense Refill   L-Methylfolate 15 MG TABS Take 1 tablet (15 mg total) by mouth daily. 30 tablet 5   acetaminophen (TYLENOL) 325 MG tablet Take 650 mg by mouth every 6 (six) hours as needed.     diazepam (VALIUM) 2 MG tablet Take 1/2 - 1 tablet by mouth twice daily as needed for panic or PTSD symptoms 30 tablet 1   escitalopram (LEXAPRO) 20 MG tablet Take 1 tablet (20 mg total) by mouth daily. 90 tablet 0   escitalopram (LEXAPRO) 5 MG/5ML solution Take 20 mLs (20 mg  total) by mouth daily. 1800 mL 1   ibuprofen (ADVIL) 200 MG tablet Take 200 mg by mouth every 6 (six) hours as needed.     ketorolac (TORADOL) 10 MG tablet Take 1 tablet (10 mg total) by mouth every 6 (six) hours as needed. (Patient not taking: Reported on 12/09/2021) 5 tablet 0   lactase (LACTAID) 3000 UNITS tablet Take 9,000 Units by mouth 3 (three) times daily with meals.     meloxicam (MOBIC) 7.5 MG tablet Take 1 tablet (7.5 mg total) by mouth 2 (two) times daily. 60 tablet 0    methylphenidate (RITALIN) 5 MG tablet Take 1 tablet (5 mg total) by mouth 3 times daily. 90 tablet 0   ondansetron (ZOFRAN) 8 MG tablet Take 1 tablet (8 mg total) by mouth 2 (two) times daily. (Patient not taking: Reported on 08/04/2022) 30 tablet 0   ondansetron (ZOFRAN-ODT) 4 MG disintegrating tablet Take 1 tablet (4 mg total) by mouth every 8 (eight) hours as needed for nausea or vomiting. 20 tablet 0   sodium fluoride (PREVIDENT 5000 PLUS) 1.1 % CREA dental cream Brush teeth twice daily. Do not eat, drink, or rinse for 30 minutes. 51 g 6   tiZANidine (ZANAFLEX) 4 MG tablet Take 1 tablet (4 mg total) by mouth 3 (three) times daily as needed. 30 tablet 0   tiZANidine (ZANAFLEX) 4 MG tablet Take 1 tablet (4 mg total) by mouth 3 (three) times daily as needed. 30 tablet 0   traMADol (ULTRAM) 50 MG tablet Take 1 tablet by mouth every 6 hours as needed 40 tablet 0   traMADol (ULTRAM) 50 MG tablet Take 1 tablet by mouth every 6 hours as needed. 40 tablet 0   traMADol (ULTRAM) 50 MG tablet Take 1 tablet (50 mg total) by mouth every 6 (six) hours as needed. 40 tablet 0   traMADol (ULTRAM) 50 MG tablet Take 1 tablet (50 mg total) by mouth every 6 (six) hours as needed. 40 tablet 0   traMADol (ULTRAM) 50 MG tablet Take 1 tablet (50 mg total) by mouth every 6 (six) hours as needed. 40 tablet 0   Zoster Vaccine Adjuvanted [facility]) injection Inject 0.5 mL into the muscle. 0.5 mL 0   No current facility-administered medications for this visit.    Medication Side Effects: None  Allergies:  Allergies  Allergen Reactions   Meperidine Hcl Other (See Comments)    Severe headache   Lactose Intolerance (Gi)     Upset stomach    Codeine Nausea Only    TAKES ZOFRAN WITH FOR NAUSEA    Past Medical History:  Diagnosis Date   Anxiety    Attention deficit disorder (ADD)    Blood clot in vein 08/2020   after 08-2020 surgery post op   Complication of anesthesia 08/2020   limited neck mobility since cervical  fusion, sleeps up on 2 pillows, right shoulder rotator cuff did noty heal properly after surgery   Depression    Dysrhythmia 2022   PACs- after epidural steroid injection in neck   Family history of adverse reaction to anesthesia    Mother, PONV and "difficult to wake up"   History of COVID-19    2021 or 2022 all symptoms resolved   History of kidney stones    MVP (mitral valve prolapse)    diagnosed at 16. Per notes from Dr. Oliver Barre in 2011, "no 2D echo evidence"   Nerve pain    per patient in bilateral hands fromn  neck injury   PONV (postoperative nausea and vomiting)     Past Medical History, Surgical history, Social history, and Family history were reviewed and updated as appropriate.   Please see review of systems for further details on the patient's review from today.   Objective:   Physical Exam:  BP 118/76   Pulse 90   LMP  (LMP Unknown)   Physical Exam Constitutional:      General: She is not in acute distress. Musculoskeletal:        General: No deformity.  Neurological:     Mental Status: She is alert and oriented to person, place, and time.     Coordination: Coordination normal.  Psychiatric:        Attention and Perception: Attention and perception normal. She does not perceive auditory or visual hallucinations.        Mood and Affect: Mood is anxious and depressed. Affect is not labile, blunt, angry or inappropriate.        Speech: Speech normal.        Behavior: Behavior normal.        Thought Content: Thought content normal. Thought content is not paranoid or delusional. Thought content does not include homicidal or suicidal ideation. Thought content does not include homicidal or suicidal plan.        Cognition and Memory: Cognition and memory normal.        Judgment: Judgment normal.     Comments: Insight intact     Lab Review:     Component Value Date/Time   NA 140 11/20/2021 1632   K 3.7 11/20/2021 1632   CL 106 11/20/2021 1632   CO2 26  11/20/2021 1632   GLUCOSE 130 (H) 11/20/2021 1632   BUN 12 11/20/2021 1632   CREATININE 0.99 11/20/2021 1632   CALCIUM 9.5 11/20/2021 1632   PROT 6.8 11/20/2021 1632   ALBUMIN 4.2 11/20/2021 1632   AST 24 11/20/2021 1632   ALT 26 11/20/2021 1632   ALKPHOS 77 11/20/2021 1632   BILITOT 0.6 11/20/2021 1632   GFRNONAA >60 11/20/2021 1632       Component Value Date/Time   WBC 19.3 (H) 11/20/2021 1632   RBC 4.48 11/20/2021 1632   HGB 13.8 11/20/2021 1632   HCT 40.9 11/20/2021 1632   PLT 301 11/20/2021 1632   MCV 91.3 11/20/2021 1632   MCH 30.8 11/20/2021 1632   MCHC 33.7 11/20/2021 1632   RDW 12.2 11/20/2021 1632   LYMPHSABS 1.7 10/30/2020 1116   MONOABS 0.4 10/30/2020 1116   EOSABS 0.1 10/30/2020 1116   BASOSABS 0.1 10/30/2020 1116    No results found for: "POCLITH", "LITHIUM"   No results found for: "PHENYTOIN", "PHENOBARB", "VALPROATE", "CBMZ"   .res Assessment: Plan:   Pt reports that she has had a history of Vit B 12 deficiency in the past and that she has not had a recent Vitamin B12 level drawn. Offered to order Vit B 12 level to rule out deficiency as cause of fatigue. Pt reports that she has an upcoming apt with her obgyn and will request that a Vitamin B 12 level be included with other scheduled labs at that time.  Discussed potential benefits, risks, and side effects of L-Methylfolate to improve mood. Pt agrees to trial of L-Methylfolate 15 mg daily for depression. Pt reports that she prefers to continue current medications without changes at this time.  Will continue Lexapro 20 mg po qd for anxiety and depression.  Continue Ritalin 5 mg three  times daily for ADHD.  Will continue Diazepam 2 mg 1/2-1 tablet twice daily as needed for anxiety.  Pt to follow-up in 3 months or sooner if clinically indicated.  Recommend continuing therapy with Stevphen Meuse, [facility].  Patient advised to contact office with any questions, adverse effects, or acute worsening in signs and  symptoms.   Shannon Swanson was seen today for follow-up.  Diagnoses and all orders for this visit:  Current mild episode of major depressive disorder without prior episode (HCC) -     escitalopram (LEXAPRO) 20 MG tablet; Take 1 tablet (20 mg total) by mouth daily. -     L-Methylfolate 15 MG TABS; Take 1 tablet (15 mg total) by mouth daily.  ADHD, predominantly inattentive type -     methylphenidate (RITALIN) 5 MG tablet; Take 1 tablet (5 mg total) by mouth 3 times daily.  Post traumatic stress disorder (PTSD) -     escitalopram (LEXAPRO) 20 MG tablet; Take 1 tablet (20 mg total) by mouth daily.  Generalized anxiety disorder -     escitalopram (LEXAPRO) 20 MG tablet; Take 1 tablet (20 mg total) by mouth daily.  Mixed obsessional thoughts and acts -     escitalopram (LEXAPRO) 20 MG tablet; Take 1 tablet (20 mg total) by mouth daily.     Please see After Visit Summary for patient specific instructions.  Future Appointments  Date Time Provider Department Center  02/23/2023 10:00 AM Stevphen Meuse, [facility] CP-CP None  03/23/2023 10:00 AM Corie Chiquito, [facility]NP CP-CP None  04/06/2023 10:00 AM Stevphen Meuse, [facility] CP-CP None  05/18/2023 10:00 AM Stevphen Meuse, [facility] CP-CP None    No orders of the defined types were placed in this encounter.   -------------------------------

## 2022-12-22 ENCOUNTER — Ambulatory Visit
Admission: RE | Admit: 2022-12-22 | Discharge: 2022-12-22 | Disposition: A | Discharge status: Home / Self Care | Payer: 59 | Source: Ambulatory Visit | Attending: Neurosurgery | Admitting: Neurosurgery

## 2022-12-22 ENCOUNTER — Ambulatory Visit: Payer: Self-pay

## 2022-12-22 DIAGNOSIS — Z981 Arthrodesis status: Secondary | ICD-10-CM | POA: Diagnosis not present

## 2022-12-22 DIAGNOSIS — R202 Paresthesia of skin: Secondary | ICD-10-CM | POA: Diagnosis not present

## 2022-12-22 DIAGNOSIS — I6509 Occlusion and stenosis of unspecified vertebral artery: Secondary | ICD-10-CM

## 2022-12-22 DIAGNOSIS — R42 Dizziness and giddiness: Secondary | ICD-10-CM | POA: Diagnosis not present

## 2022-12-22 DIAGNOSIS — M542 Cervicalgia: Secondary | ICD-10-CM

## 2022-12-27 DIAGNOSIS — M542 Cervicalgia: Secondary | ICD-10-CM | POA: Diagnosis not present

## 2022-12-29 ENCOUNTER — Other Ambulatory Visit (HOSPITAL_COMMUNITY): Payer: Self-pay

## 2023-01-11 ENCOUNTER — Ambulatory Visit: Payer: 59 | Admitting: Psychiatry

## 2023-01-17 ENCOUNTER — Other Ambulatory Visit: Payer: Self-pay

## 2023-01-17 ENCOUNTER — Other Ambulatory Visit (HOSPITAL_COMMUNITY): Payer: Self-pay

## 2023-01-17 MED ORDER — MELOXICAM 7.5 MG PO TABS
ORAL_TABLET | ORAL | 0 refills | Status: DC
  Filled 2023-01-17: qty 60, 30d supply, fill #0

## 2023-01-17 MED ORDER — TIZANIDINE HCL 4 MG PO TABS
ORAL_TABLET | ORAL | 0 refills | Status: DC
  Filled 2023-01-17: qty 30, 10d supply, fill #0

## 2023-01-21 ENCOUNTER — Other Ambulatory Visit (HOSPITAL_COMMUNITY): Payer: Self-pay

## 2023-01-25 DIAGNOSIS — Z0000 Encounter for general adult medical examination without abnormal findings: Secondary | ICD-10-CM | POA: Diagnosis not present

## 2023-01-26 ENCOUNTER — Telehealth: Payer: Self-pay | Admitting: Psychiatry

## 2023-01-26 NOTE — Telephone Encounter (Signed)
Called patient to confirm she wanted notes released to Disability determination services. She confirmed that she did agreed to allow office to send notes.

## 2023-02-01 DIAGNOSIS — Z01419 Encounter for gynecological examination (general) (routine) without abnormal findings: Secondary | ICD-10-CM | POA: Diagnosis not present

## 2023-02-01 DIAGNOSIS — Z1231 Encounter for screening mammogram for malignant neoplasm of breast: Secondary | ICD-10-CM | POA: Diagnosis not present

## 2023-02-21 DIAGNOSIS — M2550 Pain in unspecified joint: Secondary | ICD-10-CM | POA: Diagnosis not present

## 2023-02-21 DIAGNOSIS — R600 Localized edema: Secondary | ICD-10-CM | POA: Diagnosis not present

## 2023-02-21 DIAGNOSIS — R000 Tachycardia, unspecified: Secondary | ICD-10-CM | POA: Diagnosis not present

## 2023-02-22 ENCOUNTER — Other Ambulatory Visit: Payer: Self-pay | Admitting: Family Medicine

## 2023-02-22 ENCOUNTER — Ambulatory Visit (HOSPITAL_BASED_OUTPATIENT_CLINIC_OR_DEPARTMENT_OTHER)
Admission: RE | Admit: 2023-02-22 | Discharge: 2023-02-22 | Disposition: A | Discharge status: Home / Self Care | Payer: 59 | Source: Ambulatory Visit | Attending: Family Medicine | Admitting: Family Medicine

## 2023-02-22 DIAGNOSIS — R600 Localized edema: Secondary | ICD-10-CM

## 2023-02-22 DIAGNOSIS — R7989 Other specified abnormal findings of blood chemistry: Secondary | ICD-10-CM | POA: Diagnosis not present

## 2023-02-22 LAB — LAB REPORT - SCANNED: EGFR: 71

## 2023-02-23 ENCOUNTER — Telehealth: Payer: Self-pay | Admitting: Psychiatry

## 2023-02-23 ENCOUNTER — Ambulatory Visit (INDEPENDENT_AMBULATORY_CARE_PROVIDER_SITE_OTHER): Payer: 59 | Admitting: Psychiatry

## 2023-02-23 DIAGNOSIS — F4310 Post-traumatic stress disorder, unspecified: Secondary | ICD-10-CM

## 2023-02-23 NOTE — Progress Notes (Signed)
[facility] Counselor/Therapist Progress Note  Patient ID: Shannon Swanson, MRN: [id number],    Date: 02/23/2023  Time Spent: 50 minutes start time 10:10 AM end time 11 AM  Treatment Type: Individual Therapy  Reported Symptoms: anxiety, sadness, fatigue, flashbacks, triggered responses, exaggerated startle response  Mental Status Exam:  Appearance:   Well Groomed     Behavior:  Appropriate  Motor:  Normal  Speech/Language:   Normal Rate  Affect:  Appropriate  Mood:  anxious  Thought process:  normal  Thought content:    WNL  Sensory/Perceptual disturbances:    WNL  Orientation:  oriented to person, place, time/date, and situation  Attention:  Good  Concentration:  Good  Memory:  WNL  Fund of knowledge:   Good  Insight:    Good  Judgment:   Good  Impulse Control:  Good   Risk Assessment: Danger to Self:  No Self-injurious Behavior: No Danger to Others: No Duty to Warn:no Physical Aggression / Violence:No  Access to Firearms a concern: No  Gang Involvement:No   Subjective: Patient was present for session. She shared that that workman's comp was settled. She has applied for disability due to not being able to work due to physical and mental health issues. She shared she is trying to be okay but there are ups and downs. She is having a hard time doing anything due to the physical issues. She is having other medical issues. She shared she is still fearful about being around others due to fear of getting hurt.  Discussed different CBT filters she can use and ways to try and talk herself through all of the anxiety and panic she is feeling.  Encouraged patient to recognize that she has to find a new normal for herself and that things are not going to be the way they used to be and that is okay different strategies to try and help her do that were discussed with patient.  Patient was reminded of the importance of her thoughts and that she has to focus on those first.  Discussed  possibly listening to podcast by Dr. Casilda Carls on [place].  Patient was also able to develop treatment plan and set goals in session.  Reviewed cognitive distortions and gave patient handouts to work on over the next few weeks.  Interventions: Cognitive Behavioral Therapy and Solution-Oriented/Positive Psychology  Diagnosis:   ICD-10-CM   1. Post traumatic stress disorder (PTSD)  F43.10       Plan:  Patient is to use CBT and coping skills to decrease triggered responses.    Patient is to work on trying to change her perceptions of what her life can be like to be more realistic to her situation and remind herself she still has purpose and there are things she can still do.  Patient is to work on breaking things up into small pieces that she feels like she can accomplish.  Patient is to work on a daily routine and trying to develop healthy sleep patterns and hygiene.  Patient is to look at podcast by Dr. De Burrs on neuro cycle changes.  Patient is to work on reminding herself that she is enough and still has a purpose even when she is unable to work.  She is to try and focus on concrete things that she can still do.  Patient is to continue working with providers on her medical issues.  Patient is to take medication as directed   Stevphen Meuse,  [facility]

## 2023-02-23 NOTE — Telephone Encounter (Signed)
Not an encounter. 

## 2023-02-24 ENCOUNTER — Other Ambulatory Visit: Payer: Self-pay | Admitting: Family Medicine

## 2023-02-24 ENCOUNTER — Ambulatory Visit
Admission: RE | Admit: 2023-02-24 | Discharge: 2023-02-24 | Disposition: A | Discharge status: Home / Self Care | Payer: 59 | Source: Ambulatory Visit | Attending: Family Medicine | Admitting: Family Medicine

## 2023-02-24 ENCOUNTER — Telehealth: Payer: Self-pay | Admitting: Cardiology

## 2023-02-24 DIAGNOSIS — R0600 Dyspnea, unspecified: Secondary | ICD-10-CM

## 2023-02-24 DIAGNOSIS — R7989 Other specified abnormal findings of blood chemistry: Secondary | ICD-10-CM

## 2023-02-24 MED ORDER — IOPAMIDOL (ISOVUE-370) INJECTION 76%
75.0000 mL | Freq: Once | INTRAVENOUS | Status: AC | PRN
  Administered 2023-02-24: 75 mL via INTRAVENOUS

## 2023-02-24 NOTE — Telephone Encounter (Signed)
Patient called and advised that she recently went on vacation and experienced some Dizziness ,SOB , Heart Palps while she was away . While on the flight she had Swelling in her legs . When she returned she has had a random cough and Trouble catching her breath . Her PCP checked her labs and her D Dimer was elevated . She had a ultrasound done and it was negative for Blood clots .But she has concern for PE . I have moved her appt up but she wants to know if someone can reach out to her and let her know what she needs to do if anything before she sees Ganji . She has not seen him in a while also .

## 2023-02-24 NOTE — Telephone Encounter (Signed)
Please call her and ask her to see her PCP sooner so she can determine if she needs CT angio

## 2023-02-27 ENCOUNTER — Encounter: Payer: Self-pay | Admitting: Cardiology

## 2023-02-27 ENCOUNTER — Other Ambulatory Visit: Payer: 59

## 2023-02-27 NOTE — Progress Notes (Signed)
CT angiogram chest 02/24/2023: Mild radiation for evaluation of pulmonary embolism, adequate for interpretation. There is no filling defect to suggest acute pulmonary embolism.  Great vessels are normal in course and caliber.  Normal heart size.  No significant pericardial fluid or thickening. Visualized lung fields are normal.  There is a 1.0 x 0.4 cm posterior basilar right lower lobe pulmonary nodule, previously 0.9 x 0.4 on 04/28/2016 and remains essentially unchanged.  Considered to be benign.

## 2023-03-01 ENCOUNTER — Ambulatory Visit: Payer: 59 | Admitting: Cardiology

## 2023-03-01 ENCOUNTER — Encounter: Payer: Self-pay | Admitting: Cardiology

## 2023-03-01 VITALS — BP 128/78 | HR 96 | Resp 16 | Ht 68.0 in | Wt 178.6 lb

## 2023-03-01 DIAGNOSIS — R739 Hyperglycemia, unspecified: Secondary | ICD-10-CM

## 2023-03-01 DIAGNOSIS — E7800 Pure hypercholesterolemia, unspecified: Secondary | ICD-10-CM

## 2023-03-01 DIAGNOSIS — R600 Localized edema: Secondary | ICD-10-CM | POA: Diagnosis not present

## 2023-03-01 DIAGNOSIS — G4739 Other sleep apnea: Secondary | ICD-10-CM | POA: Diagnosis not present

## 2023-03-01 NOTE — Progress Notes (Signed)
Primary Physician/Referring:  Ailene Ravel, MD  Patient ID: Shannon Swanson, female    DOB: 1970/07/30, 52 y.o.   MRN: [id number]  Chief Complaint  Patient presents with   New Patient (Initial Visit)   Edema   HPI:    Shannon Swanson  is a 52 y.o. extremely complex female patient with anxiety, ADD, work-related traumatic injury leading to multiple surgeries and of neck, told to have narcolepsy in the past and was recommended CPAP, referred to me for evaluation of leg edema, leg discomfort, fatigue.  Patient recently made a overseas trip to [place] and developed significant leg edema and also noticed worsening dyspnea, D-dimer was elevated, I reviewed her labs, CT angiogram of the chest, she did not have any PE, lower extremity venous duplex was negative for DVT.  Left leg edema is worse on the right.  She also complains of marked daytime somnolence, chronic fatigue and decreased exercise capacity.  She also complains of pain in her legs, states that they feel extremely heavy doing any activity.  Leg edema is improved over time since she has been back.  Past Medical History:  Diagnosis Date   Anxiety    Attention deficit disorder (ADD)    Blood clot in vein 08/2020   after 08-2020 surgery post op   Complication of anesthesia 08/2020   limited neck mobility since cervical fusion, sleeps up on 2 pillows, right shoulder rotator cuff did noty heal properly after surgery   Depression    Dysrhythmia 2022   PACs- after epidural steroid injection in neck   Family history of adverse reaction to anesthesia    Mother, PONV and "difficult to wake up"   History of COVID-19    2021 or 2022 all symptoms resolved   History of kidney stones    MVP (mitral valve prolapse)    diagnosed at 16. Per notes from Dr. Oliver Barre in 2011, "no 2D echo evidence"   Nerve pain    per patient in bilateral hands fromn neck injury   PONV (postoperative nausea and vomiting)    Past Surgical History:   Procedure Laterality Date   ANTERIOR CERVICAL DECOMP/DISCECTOMY FUSION N/A 09/07/2020   Procedure: Anterior Cervical Decompression Fusion - Cervical four-Cervical five - Cervical six-Cervical seven with removal Cervical five-six;  Surgeon: Donalee Citrin, MD;  Location: [facility];  Service: Neurosurgery;  Laterality: N/A;   CERVICAL DISCECTOMY  2007   anterior approach 2007 Dr Wynetta Emery    CYSTOSCOPY WITH RETROGRADE PYELOGRAM, URETEROSCOPY AND STENT PLACEMENT Bilateral 12/01/2021   Procedure: CYSTOSCOPY WITH RETROGRADE PYELOGRAM, URETEROSCOPY AND STENT PLACEMENT;  Surgeon: Sebastian Ache, MD;  Location: [facility];  Service: Urology;  Laterality: Bilateral;  1 HR   DILATION AND CURETTAGE OF UTERUS     HOLMIUM LASER APPLICATION Bilateral 12/01/2021   Procedure: HOLMIUM LASER APPLICATION;  Surgeon: Sebastian Ache, MD;  Location: [facility];  Service: Urology;  Laterality: Bilateral;   LUMBAR DISC SURGERY     rotator cuff repair Right 06/2019   partial repair by Dr. Ave Filter   TONSILLECTOMY     Family History  Problem Relation Age of Onset   Diabetes Mother    Mitral valve prolapse Mother    Hiatal hernia Mother    Emphysema Father    Hyperlipidemia Sister    Diabetes Sister    Pulmonary embolism Sister    Diabetes Maternal Aunt    Diabetes Maternal Uncle     Social History  Tobacco Use   Smoking status: Never   Smokeless tobacco: Never  Substance Use Topics   Alcohol use: No   Marital Status: Married  ROS  Review of Systems  Constitutional: Positive for malaise/fatigue.  Cardiovascular:  Positive for dyspnea on exertion and leg swelling. Negative for chest pain.   Objective      03/01/2023    3:22 PM 03/01/2023    2:28 PM 12/21/2022   11:58 AM  Vitals with BMI  Height  5\' 8"    Weight  178 lbs 10 oz   BMI  27.16   Systolic 128 147   Diastolic 78 89   Pulse  96      Information is confidential and restricted. Go to Review Flowsheets to unlock  data.   Blood pressure 128/78, pulse 96, resp. rate 16, height 5\' 8"  (1.727 m), weight 178 lb 9.6 oz (81 kg), SpO2 97%.  Physical Exam Neck:     Vascular: No carotid bruit or JVD.  Cardiovascular:     Rate and Rhythm: Normal rate and regular rhythm.     Pulses: Intact distal pulses.     Heart sounds: Normal heart sounds. No murmur heard.    No gallop.  Pulmonary:     Effort: Pulmonary effort is normal.     Breath sounds: Normal breath sounds.  Abdominal:     General: Bowel sounds are normal.     Palpations: Abdomen is soft.  Musculoskeletal:     Right lower leg: Edema (1-2+ pitting below knee, superficial venous engorgement noted.) present.     Left lower leg: Edema (1-2+ pitting below knee, superficial venous engorgement noted.) present.    Laboratory examination:   No results for input(s): "NA", "K", "CL", "CO2", "GLUCOSE", "BUN", "CREATININE", "CALCIUM", "GFRNONAA", "GFRAA" in the last 8760 hours.  Lab Results  Component Value Date   GLUCOSE 130 (H) 11/20/2021   NA 140 11/20/2021   K 3.7 11/20/2021   CL 106 11/20/2021   CO2 26 11/20/2021   BUN 12 11/20/2021   CREATININE 0.99 11/20/2021   EGFR 71.0 02/22/2023   CALCIUM 9.5 11/20/2021   PROT 6.8 11/20/2021   ALBUMIN 4.2 11/20/2021   BILITOT 0.6 11/20/2021   ALKPHOS 77 11/20/2021   AST 24 11/20/2021   ALT 26 11/20/2021   ANIONGAP 8 11/20/2021      Lab Results  Component Value Date   ALT 26 11/20/2021   AST 24 11/20/2021   ALKPHOS 77 11/20/2021   BILITOT 0.6 11/20/2021       Latest Ref Rng & Units 11/20/2021    4:32 PM 10/30/2020   11:36 AM 10/30/2020   11:16 AM  CBC  WBC 4.0 - 10.5 K/uL 19.3   5.2   Hemoglobin 12.0 - 15.0 g/dL 56.2  13.0  86.5   Hematocrit 36.0 - 46.0 % 40.9  44.0  44.4   Platelets 150 - 400 K/uL 301   294        Latest Ref Rng & Units 11/20/2021    4:32 PM 10/30/2020   11:16 AM 11/25/2014   12:48 PM  Hepatic Function  Total Protein 6.5 - 8.1 g/dL 6.8  7.0  7.0   Albumin 3.5 - 5.0  g/dL 4.2  4.2  4.3   AST 15 - 41 U/L 24  29  20    ALT 0 - 44 U/L 26  29  20    Alk Phosphatase 38 - 126 U/L 77  84  65   Total Bilirubin  0.3 - 1.2 mg/dL 0.6  0.6  0.5   Bilirubin, Direct 0.0 - 0.3 mg/dL   0.1     External labs:   Labs 02/21/2023:  TSH normal at 2.470.  Free T4 normal at 1.03.  BUN 10, creatinine 0.95, EGFR 71 mL, sodium 143, potassium 4.4, LFTs normal.  D-dimer 1.16. Cholesterol, total 222.000 M 01/25/2023 HDL 48.000 MG 01/25/2023 LDL 182.000 m 12/28/2020 Triglycerides 83.000 MG 01/25/2023  A1C 6.000 % 01/25/2023 TSH 2.470 02/21/2023  Hemoglobin 13.900 g/d 02/21/2023 Platelets 319.000 x1 02/21/2023  Creatinine, Serum 0.950 mg/ 02/21/2023 Potassium 4.400 mm 02/21/2023 ALT (SGPT) 28.000 IU/ 02/21/2023  Radiology:   Lower extremity venous duplex 02/22/2023: Normal compressibility of the common and superficial femoral and popliteal veins as well as visualized calf veins.  Negative for DVT.  CT angio chest 02/24/2023: Moderate quality study.  No filling defects in major branches.  Great vessels are normal in course and caliber.  Normal heart size.  No significant pericardial fluid or thickening. Visualized lung fields are normal, there is a subpleural solid 1.0 x 0.4 cm posterior basilar right lower lobe pulmonary nodule previously noted on 04/28/2016 CT, benign in nature.  Cardiac Studies:   Event monitor weeks 05/20/2020: Predominant rhythm is normal sinus rhythm.  Patient symptoms correlated with normal sinus rhythm with artifact and lead loss.  No other significant arrhythmias were noted. Minimum heart rate 56 bpm at 4:45 AM and maximum heart rate of 149 bpm at 10:26 AM. Symptomatic transmissions revealed brief atrial tachycardia episodes and artifact.  Longest run 6 beats.  EKG:   EKG 03/01/2023: Normal sinus rhythm at rate of 88 bpm, left atrial enlargement, normal axis.  Incomplete right bundle branch block.  Compared to 08/28/2015, LAE is new.    Medications and  allergies   Allergies  Allergen Reactions   Meperidine Hcl Other (See Comments)    Severe headache   Lactose Intolerance (Gi)     Upset stomach    Codeine Nausea Only    TAKES ZOFRAN WITH FOR NAUSEA    Medication list   Current Outpatient Medications:    acetaminophen (TYLENOL) 325 MG tablet, Take 650 mg by mouth every 6 (six) hours as needed., Disp: , Rfl:    diazepam (VALIUM) 2 MG tablet, Take 1/2 - 1 tablet by mouth twice daily as needed for panic or PTSD symptoms, Disp: 30 tablet, Rfl: 1   escitalopram (LEXAPRO) 20 MG tablet, Take 1 tablet (20 mg total) by mouth daily., Disp: 90 tablet, Rfl: 0   ibuprofen (ADVIL) 200 MG tablet, Take 200 mg by mouth every 6 (six) hours as needed., Disp: , Rfl:    ketorolac (TORADOL) 10 MG tablet, Take 1 tablet (10 mg total) by mouth every 6 (six) hours as needed., Disp: 5 tablet, Rfl: 0   L-Methylfolate 15 MG TABS, Take 1 tablet (15 mg total) by mouth daily., Disp: 30 tablet, Rfl: 5   lactase (LACTAID) 3000 UNITS tablet, Take 9,000 Units by mouth 3 (three) times daily with meals., Disp: , Rfl:    meloxicam (MOBIC) 7.5 MG tablet, Take 1 tablet (7.5 mg total) by mouth 2 (two) times daily., Disp: 60 tablet, Rfl: 0   methylphenidate (RITALIN) 5 MG tablet, Take 1 tablet (5 mg total) by mouth 3 times daily., Disp: 90 tablet, Rfl: 0   ondansetron (ZOFRAN-ODT) 4 MG disintegrating tablet, Take 1 tablet (4 mg total) by mouth every 8 (eight) hours as needed for nausea or vomiting., Disp: 20 tablet, Rfl: 0  tiZANidine (ZANAFLEX) 4 MG tablet, Take 1 tablet (4 mg total) by mouth 3 (three) times daily as needed., Disp: 30 tablet, Rfl: 0   traMADol (ULTRAM) 50 MG tablet, Take 1 tablet by mouth every 6 hours as needed, Disp: 40 tablet, Rfl: 0   escitalopram (LEXAPRO) 5 MG/5ML solution, Take 20 mLs (20 mg total) by mouth daily., Disp: 1800 mL, Rfl: 1  Assessment     ICD-10-CM   1. Bilateral leg edema  R60.0 EKG 12-Lead    Ambulatory referral to Interventional  Radiology    PCV ECHOCARDIOGRAM COMPLETE    2. Hypercholesteremia  E78.00     3. Other sleep apnea  G47.39     4. Hyperglycemia  R73.9        Orders Placed This Encounter  Procedures   Ambulatory referral to Interventional Radiology    Referral Priority:   Routine    Referral Type:   Consultation    Referral Reason:   Specialty Services Required    Referred to Provider:   Richarda Overlie, MD    Requested Specialty:   Interventional Radiology    Number of Visits Requested:   1   EKG 12-Lead   PCV ECHOCARDIOGRAM COMPLETE    Standing Status:   Future    Standing Expiration Date:   02/29/2024    No orders of the defined types were placed in this encounter.   Medications Discontinued During This Encounter  Medication Reason   ondansetron (ZOFRAN) 8 MG tablet    tiZANidine (ZANAFLEX) 4 MG tablet    sodium fluoride (PREVIDENT 5000 PLUS) 1.1 % CREA dental cream    traMADol (ULTRAM) 50 MG tablet    traMADol (ULTRAM) 50 MG tablet    traMADol (ULTRAM) 50 MG tablet    traMADol (ULTRAM) 50 MG tablet    Zoster Vaccine Adjuvanted [facility]) injection      Recommendations:   Shannon Swanson is a 52 y.o. extremely complex female patient with anxiety, ADD, work-related traumatic injury leading to multiple surgeries and of neck, told to have narcolepsy in the past and was recommended CPAP, referred to me for evaluation of leg edema, leg discomfort, fatigue.  1. Bilateral leg edema Patient recently made a overseas trip to [place] and developed significant leg edema and also noticed worsening dyspnea, D-dimer was elevated, I reviewed her labs, CT angiogram of the chest, she did not have any PE, lower extremity venous duplex was negative for DVT.  Left leg edema is worse on the right.  May-Thurner syndrome, chronic venous insufficiency of bilateral lower extremity left worse than the right is in the differential diagnosis.  I will make referral for interventional radiology to see the patient and  evaluate.  Her leg discomfort in the form of heaviness may be a combination of venous claudication and deconditioning.  In view of leg edema, mild dyspnea, will obtain an echocardiogram for baseline evaluation as well.  Do not think she is an decompensated heart failure.  - EKG 12-Lead - Ambulatory referral to Interventional Radiology - PCV ECHOCARDIOGRAM COMPLETE; Future  2. Hypercholesteremia Patient has markedly elevated LDL, external labs reviewed.  However I do not want to start her on any statin therapy in view of ongoing leg discomfort and multiple somatic complaints.  I would like her to stabilize first from medical standpoint and consider statin therapy at a later date.  I also discussed with her regarding chances of mild hyperglycemia that can worsen with statins, but she is willing to take  the statins if indicated.  She has hypoglycemia, as additional risk factor and multiple members in her family having high cholesterol as well.  3. Other sleep apnea Patient was remotely told to have what appears to be narcolepsy and obstructive sleep apnea.  She has severe daytime somnolence, chronic fatigue which may be contributed by sleep apnea.  She would benefit from sleep study.  4. Hyperglycemia As discussed above, hyperglycemia is a risk factor discussed with the patient.  I would like to see her back in 6 to 8 weeks for follow-up.  This is a complex 60-minute office visit encounter in evaluation of multiple medical records, external labs, multiple somatic complaints and coordination of care.   Yates Decamp, MD, [facility] 03/01/2023, 6:57 PM Office: [phone]

## 2023-03-03 NOTE — Telephone Encounter (Signed)
Patient was informed to contact her PCP

## 2023-03-06 ENCOUNTER — Other Ambulatory Visit (HOSPITAL_COMMUNITY): Payer: Self-pay

## 2023-03-06 ENCOUNTER — Other Ambulatory Visit: Payer: Self-pay | Admitting: Psychiatry

## 2023-03-06 DIAGNOSIS — F900 Attention-deficit hyperactivity disorder, predominantly inattentive type: Secondary | ICD-10-CM

## 2023-03-06 MED ORDER — MELOXICAM 7.5 MG PO TABS
7.5000 mg | ORAL_TABLET | Freq: Two times a day (BID) | ORAL | 0 refills | Status: DC
  Filled 2023-03-06: qty 60, 30d supply, fill #0

## 2023-03-06 MED ORDER — TIZANIDINE HCL 4 MG PO TABS
4.0000 mg | ORAL_TABLET | Freq: Three times a day (TID) | ORAL | 0 refills | Status: DC | PRN
  Filled 2023-03-06: qty 30, 10d supply, fill #0

## 2023-03-07 ENCOUNTER — Other Ambulatory Visit: Payer: Self-pay

## 2023-03-07 ENCOUNTER — Other Ambulatory Visit (HOSPITAL_COMMUNITY): Payer: Self-pay

## 2023-03-07 MED ORDER — TRAMADOL HCL 50 MG PO TABS
50.0000 mg | ORAL_TABLET | Freq: Four times a day (QID) | ORAL | 0 refills | Status: DC | PRN
Start: 2023-03-07 — End: 2023-04-26
  Filled 2023-03-07: qty 40, 10d supply, fill #0

## 2023-03-07 MED ORDER — METHYLPHENIDATE HCL 5 MG PO TABS
5.0000 mg | ORAL_TABLET | Freq: Three times a day (TID) | ORAL | 0 refills | Status: DC
  Filled 2023-03-07: qty 90, 30d supply, fill #0

## 2023-03-15 ENCOUNTER — Ambulatory Visit: Payer: 59

## 2023-03-15 DIAGNOSIS — R600 Localized edema: Secondary | ICD-10-CM

## 2023-03-20 ENCOUNTER — Other Ambulatory Visit (HOSPITAL_COMMUNITY): Payer: Self-pay

## 2023-03-20 ENCOUNTER — Ambulatory Visit: Payer: 59 | Admitting: Cardiology

## 2023-03-20 ENCOUNTER — Encounter: Payer: Self-pay | Admitting: Cardiology

## 2023-03-20 VITALS — BP 134/83 | HR 91 | Resp 16 | Ht 68.0 in | Wt 180.2 lb

## 2023-03-20 DIAGNOSIS — E7800 Pure hypercholesterolemia, unspecified: Secondary | ICD-10-CM

## 2023-03-20 DIAGNOSIS — R600 Localized edema: Secondary | ICD-10-CM

## 2023-03-20 MED ORDER — EZETIMIBE-SIMVASTATIN 10-20 MG PO TABS
1.0000 | ORAL_TABLET | Freq: Every day | ORAL | 2 refills | Status: DC
Start: 2023-03-20 — End: 2023-09-12
  Filled 2023-03-20: qty 30, 30d supply, fill #0
  Filled 2023-04-26: qty 30, 30d supply, fill #1
  Filled 2023-06-06: qty 30, 30d supply, fill #2

## 2023-03-20 NOTE — Progress Notes (Signed)
Echocardiogram 03/15/2023: 1. Left ventricle cavity is normal in size. Normal left ventricular wall thickness. Normal global wall motion. Normal LV systolic function with EF 70%. Normal diastolic filling pattern. 2. No significant valvular abnormality. 3. Normal right atrial pressure. 4. No significant change compared to previous study in 2017.

## 2023-03-20 NOTE — Progress Notes (Signed)
Primary Physician/Referring:  Ailene Ravel, MD  Patient ID: Shannon Swanson, female    DOB: [birth year], 52 y.o.   MRN: [id number]  Chief Complaint  Patient presents with   Bilateral leg edema   Fatigue   Follow-up    6 weeks   HPI:    Shannon Swanson  is a 52 y.o. complex female patient with anxiety, ADD, work-related traumatic injury leading to multiple surgeries and of neck, told to have narcolepsy/complex sleep apnea in the past and was recommended CPAP, referred to me for evaluation of leg edema, leg discomfort, fatigue.  Patient recently made a overseas trip to [place] and developed significant leg edema. Negative CTA for PE.  Left leg edema is worse on the right.  She also complains of pain in her legs, states that they feel extremely heavy doing any activity.     Past Medical History:  Diagnosis Date   Anxiety    Attention deficit disorder (ADD)    Blood clot in vein 08/2020   after 08-2020 surgery post op   Complication of anesthesia 08/2020   limited neck mobility since cervical fusion, sleeps up on 2 pillows, right shoulder rotator cuff did noty heal properly after surgery   Depression    Dysrhythmia 2022   PACs- after epidural steroid injection in neck   Family history of adverse reaction to anesthesia    Mother, PONV and "difficult to wake up"   History of COVID-19    2021 or 2022 all symptoms resolved   History of kidney stones    MVP (mitral valve prolapse)    diagnosed at 16. Per notes from Dr. Oliver Barre in 2011, "no 2D echo evidence"   Nerve pain    per patient in bilateral hands fromn neck injury   PONV (postoperative nausea and vomiting)    Past Surgical History:  Procedure Laterality Date   ANTERIOR CERVICAL DECOMP/DISCECTOMY FUSION N/A 09/07/2020   Procedure: Anterior Cervical Decompression Fusion - Cervical four-Cervical five - Cervical six-Cervical seven with removal Cervical five-six;  Surgeon: Donalee Citrin, MD;  Location: [facility] OR;  Service:  Neurosurgery;  Laterality: N/A;   CERVICAL DISCECTOMY  2007   anterior approach 2007 Dr Wynetta Emery    CYSTOSCOPY WITH RETROGRADE PYELOGRAM, URETEROSCOPY AND STENT PLACEMENT Bilateral 12/01/2021   Procedure: CYSTOSCOPY WITH RETROGRADE PYELOGRAM, URETEROSCOPY AND STENT PLACEMENT;  Surgeon: Sebastian Ache, MD;  Location: [facility];  Service: Urology;  Laterality: Bilateral;  1 HR   DILATION AND CURETTAGE OF UTERUS     HOLMIUM LASER APPLICATION Bilateral 12/01/2021   Procedure: HOLMIUM LASER APPLICATION;  Surgeon: Sebastian Ache, MD;  Location: [facility];  Service: Urology;  Laterality: Bilateral;   LUMBAR DISC SURGERY     rotator cuff repair Right 06/2019   partial repair by Dr. Ave Filter   TONSILLECTOMY     Family History  Problem Relation Age of Onset   Diabetes Mother    Mitral valve prolapse Mother    Hiatal hernia Mother    Emphysema Father    Hyperlipidemia Sister    Diabetes Sister    Pulmonary embolism Sister    Diabetes Maternal Aunt    Diabetes Maternal Uncle     Social History   Tobacco Use   Smoking status: Never   Smokeless tobacco: Never  Substance Use Topics   Alcohol use: No   Marital Status: Married  ROS  Review of Systems  Constitutional: Positive for malaise/fatigue.  Cardiovascular:  Positive for  dyspnea on exertion and leg swelling. Negative for chest pain.   Objective      03/20/2023    1:20 PM 03/01/2023    3:22 PM 03/01/2023    2:28 PM  Vitals with BMI  Height 5\' 8"   5\' 8"   Weight 180 lbs 3 oz  178 lbs 10 oz  BMI 27.41  27.16  Systolic 134 128 595  Diastolic 83 78 89  Pulse 91  96   Blood pressure 134/83, pulse 91, resp. rate 16, height 5\' 8"  (1.727 m), weight 180 lb 3.2 oz (81.7 kg), SpO2 96%.  Physical Exam Neck:     Vascular: No carotid bruit or JVD.  Cardiovascular:     Rate and Rhythm: Normal rate and regular rhythm.     Pulses: Intact distal pulses.     Heart sounds: Normal heart sounds. No murmur heard.     No gallop.  Pulmonary:     Effort: Pulmonary effort is normal.     Breath sounds: Normal breath sounds.  Abdominal:     General: Bowel sounds are normal.     Palpations: Abdomen is soft.  Musculoskeletal:     Right lower leg: Edema (1-2+ pitting below knee, superficial venous engorgement noted.) present.     Left lower leg: Edema (2+ pitting below knee, superficial venous engorgement noted.) present.    Laboratory examination:   No results for input(s): "NA", "K", "CL", "CO2", "GLUCOSE", "BUN", "CREATININE", "CALCIUM", "GFRNONAA", "GFRAA" in the last 8760 hours.  Lab Results  Component Value Date   GLUCOSE 130 (H) 11/20/2021   NA 140 11/20/2021   K 3.7 11/20/2021   CL 106 11/20/2021   CO2 26 11/20/2021   BUN 12 11/20/2021   CREATININE 0.99 11/20/2021   EGFR 71.0 02/22/2023   CALCIUM 9.5 11/20/2021   PROT 6.8 11/20/2021   ALBUMIN 4.2 11/20/2021   BILITOT 0.6 11/20/2021   ALKPHOS 77 11/20/2021   AST 24 11/20/2021   ALT 26 11/20/2021   ANIONGAP 8 11/20/2021      Lab Results  Component Value Date   ALT 26 11/20/2021   AST 24 11/20/2021   ALKPHOS 77 11/20/2021   BILITOT 0.6 11/20/2021       Latest Ref Rng & Units 11/20/2021    4:32 PM 10/30/2020   11:36 AM 10/30/2020   11:16 AM  CBC  WBC 4.0 - 10.5 K/uL 19.3   5.2   Hemoglobin 12.0 - 15.0 g/dL 63.8  75.6  43.3   Hematocrit 36.0 - 46.0 % 40.9  44.0  44.4   Platelets 150 - 400 K/uL 301   294        Latest Ref Rng & Units 11/20/2021    4:32 PM 10/30/2020   11:16 AM 11/25/2014   12:48 PM  Hepatic Function  Total Protein 6.5 - 8.1 g/dL 6.8  7.0  7.0   Albumin 3.5 - 5.0 g/dL 4.2  4.2  4.3   AST 15 - 41 U/L 24  29  20    ALT 0 - 44 U/L 26  29  20    Alk Phosphatase 38 - 126 U/L 77  84  65   Total Bilirubin 0.3 - 1.2 mg/dL 0.6  0.6  0.5   Bilirubin, Direct 0.0 - 0.3 mg/dL   0.1     External labs:   Labs 02/21/2023:  TSH normal at 2.470.  Free T4 normal at 1.03.  BUN 10, creatinine 0.95, EGFR 71 mL, sodium 143,  potassium 4.4,  LFTs normal.  D-dimer 1.16. Cholesterol, total 222.000 M 01/25/2023 HDL 48.000 MG 01/25/2023 LDL 182.000 m 12/28/2020 Triglycerides 83.000 MG 01/25/2023  A1C 6.000 % 01/25/2023 TSH 2.470 02/21/2023  Hemoglobin 13.900 g/d 02/21/2023 Platelets 319.000 x1 02/21/2023  Creatinine, Serum 0.950 mg/ 02/21/2023 Potassium 4.400 mm 02/21/2023 ALT (SGPT) 28.000 IU/ 02/21/2023  Radiology:   Lower extremity venous duplex 02/22/2023: Normal compressibility of the common and superficial femoral and popliteal veins as well as visualized calf veins.  Negative for DVT.  CT angio chest 02/24/2023: Moderate quality study.  No filling defects in major branches.  Great vessels are normal in course and caliber.  Normal heart size.  No significant pericardial fluid or thickening. Visualized lung fields are normal, there is a subpleural solid 1.0 x 0.4 cm posterior basilar right lower lobe pulmonary nodule previously noted on 04/28/2016 CT, benign in nature.  Cardiac Studies:   Sleep Study 10/11/2005: Study shows evidence of possible REM related sleep apnea with apnea-hypopnea index of 5.0.  Additionally she has evidence of snore pressure and arousals. Unusual waveforms noted in EEG as stated above and may need sleep deprived EEG.  Event monitor weeks 05/20/2020: Predominant rhythm is normal sinus rhythm.  Patient symptoms correlated with normal sinus rhythm with artifact and lead loss.  No other significant arrhythmias were noted. Minimum heart rate 56 bpm at 4:45 AM and maximum heart rate of 149 bpm at 10:26 AM. Symptomatic transmissions revealed brief atrial tachycardia episodes and artifact.  Longest run 6 beats.  Echocardiogram 03/15/2023: 1. Left ventricle cavity is normal in size. Normal left ventricular wall thickness. Normal global wall motion. Normal LV systolic function with EF 70%. Normal diastolic filling pattern. 2. No significant valvular abnormality. 3. Normal right atrial pressure. 4.  No significant change compared to previous study in 2017.  EKG:   EKG 03/01/2023: Normal sinus rhythm at rate of 88 bpm, left atrial enlargement, normal axis.  Incomplete right bundle branch block.  Compared to 08/28/2015, LAE is new.    Medications and allergies   Allergies  Allergen Reactions   Meperidine Hcl Other (See Comments)    Severe headache   Lactose Intolerance (Gi)     Upset stomach    Codeine Nausea Only    TAKES ZOFRAN WITH FOR NAUSEA    Medication list   Current Outpatient Medications:    acetaminophen (TYLENOL) 325 MG tablet, Take 650 mg by mouth every 6 (six) hours as needed., Disp: , Rfl:    diazepam (VALIUM) 2 MG tablet, Take 1/2 - 1 tablet by mouth twice daily as needed for panic or PTSD symptoms, Disp: 30 tablet, Rfl: 1   escitalopram (LEXAPRO) 20 MG tablet, Take 1 tablet (20 mg total) by mouth daily., Disp: 90 tablet, Rfl: 0   ezetimibe-simvastatin (VYTORIN) 10-20 MG tablet, Take 1 tablet by mouth daily at 6 PM., Disp: 30 tablet, Rfl: 2   ibuprofen (ADVIL) 200 MG tablet, Take 200 mg by mouth every 6 (six) hours as needed., Disp: , Rfl:    ketorolac (TORADOL) 10 MG tablet, Take 1 tablet (10 mg total) by mouth every 6 (six) hours as needed., Disp: 5 tablet, Rfl: 0   L-Methylfolate 15 MG TABS, Take 1 tablet (15 mg total) by mouth daily., Disp: 30 tablet, Rfl: 5   lactase (LACTAID) 3000 UNITS tablet, Take 9,000 Units by mouth 3 (three) times daily with meals., Disp: , Rfl:    meloxicam (MOBIC) 7.5 MG tablet, Take 1 tablet (7.5 mg total) by mouth 2 (two)  times daily., Disp: 60 tablet, Rfl: 0   methylphenidate (RITALIN) 5 MG tablet, Take 1 tablet (5 mg total) by mouth 3 times daily., Disp: 90 tablet, Rfl: 0   ondansetron (ZOFRAN-ODT) 4 MG disintegrating tablet, Take 1 tablet (4 mg total) by mouth every 8 (eight) hours as needed for nausea or vomiting., Disp: 20 tablet, Rfl: 0   tiZANidine (ZANAFLEX) 4 MG tablet, Take 1 tablet (4 mg total) by mouth 3 (three) times daily as  needed., Disp: 30 tablet, Rfl: 0   tiZANidine (ZANAFLEX) 4 MG tablet, Take 1 tablet (4 mg total) by mouth 3 (three) times daily as needed., Disp: 30 tablet, Rfl: 0   traMADol (ULTRAM) 50 MG tablet, Take 1 tablet by mouth every 6 hours as needed, Disp: 40 tablet, Rfl: 0   traMADol (ULTRAM) 50 MG tablet, Take 1 tablet (50 mg total) by mouth every 6 (six) hours as needed., Disp: 40 tablet, Rfl: 0   escitalopram (LEXAPRO) 5 MG/5ML solution, Take 20 mLs (20 mg total) by mouth daily. (Patient not taking: Reported on 03/20/2023), Disp: 1800 mL, Rfl: 1  Assessment     ICD-10-CM   1. Bilateral leg edema  R60.0 Ambulatory referral to Vascular Surgery    2. Hypercholesteremia  E78.00 ezetimibe-simvastatin (VYTORIN) 10-20 MG tablet        Orders Placed This Encounter  Procedures   Ambulatory referral to Vascular Surgery    Referral Priority:   Routine    Referral Type:   Surgical    Referral Reason:   Specialty Services Required    Requested Specialty:   Vascular Surgery    Number of Visits Requested:   1    Meds ordered this encounter  Medications   ezetimibe-simvastatin (VYTORIN) 10-20 MG tablet    Sig: Take 1 tablet by mouth daily at 6 PM.    Dispense:  30 tablet    Refill:  2    There are no discontinued medications.    Recommendations:   Shannon Swanson is a 52 y.o. complex female patient with anxiety, ADD, work-related traumatic injury leading to multiple surgeries and of neck, told to have narcolepsy/complex sleep apnea in the past and was recommended CPAP, referred to me for evaluation of leg edema, leg discomfort, fatigue.  1. Bilateral leg edema Patient recently made a overseas trip to [place] and developed significant leg edema. Negative CTA for PE.  Her echocardiogram does not reveal any evidence of diastolic dysfunction or systolic dysfunction and no significant valvular abnormality.  Hence I do not think her leg edema is related to cardiac issues.  Left leg edema is worse  on the left.  May-Thurner syndrome, chronic venous insufficiency of bilateral lower extremity left worse than the right is in the differential diagnosis.  She may also have venous insufficiency.  I will make a referral for vascular surgery to evaluate her further.  - Ambulatory referral to Vascular Surgery  2. Hypercholesteremia I discussed with the patient that she is postmenopausal, LDL is 182.  Goal LDL at least <100-130 in the absence of diabetes mellitus, smoking.  However she does have hyperglycemia as a risk factor.  As she is stable from cardiac standpoint, I reassured her.  She can follow-up with her PCP for further management and evaluation of her hypercholesterolemia.  Patient will contact me if she does not hear back from surgical team.  - ezetimibe-simvastatin (VYTORIN) 10-20 MG tablet; Take 1 tablet by mouth daily at 6 PM.  Dispense: 30 tablet; Refill:  2     Yates Decamp, MD, [facility] 03/20/2023, 2:42 PM Office: [phone]

## 2023-03-23 ENCOUNTER — Ambulatory Visit (INDEPENDENT_AMBULATORY_CARE_PROVIDER_SITE_OTHER): Payer: 59 | Admitting: Psychiatry

## 2023-03-23 ENCOUNTER — Encounter: Payer: Self-pay | Admitting: Psychiatry

## 2023-03-23 ENCOUNTER — Other Ambulatory Visit (HOSPITAL_COMMUNITY): Payer: Self-pay

## 2023-03-23 DIAGNOSIS — F900 Attention-deficit hyperactivity disorder, predominantly inattentive type: Secondary | ICD-10-CM

## 2023-03-23 DIAGNOSIS — F4310 Post-traumatic stress disorder, unspecified: Secondary | ICD-10-CM | POA: Diagnosis not present

## 2023-03-23 DIAGNOSIS — F422 Mixed obsessional thoughts and acts: Secondary | ICD-10-CM | POA: Diagnosis not present

## 2023-03-23 DIAGNOSIS — F411 Generalized anxiety disorder: Secondary | ICD-10-CM

## 2023-03-23 DIAGNOSIS — F320 Major depressive disorder, single episode, mild: Secondary | ICD-10-CM | POA: Diagnosis not present

## 2023-03-23 MED ORDER — METHYLPHENIDATE HCL 5 MG PO TABS
5.0000 mg | ORAL_TABLET | Freq: Three times a day (TID) | ORAL | 0 refills | Status: DC
  Filled 2023-04-26: qty 90, 30d supply, fill #0

## 2023-03-23 MED ORDER — DIAZEPAM 2 MG PO TABS
ORAL_TABLET | ORAL | 1 refills | Status: DC
  Filled 2023-03-23: qty 30, 15d supply, fill #0

## 2023-03-23 MED ORDER — METHYLPHENIDATE HCL 5 MG PO TABS
5.0000 mg | ORAL_TABLET | Freq: Three times a day (TID) | ORAL | 0 refills | Status: DC
Start: 2023-05-02 — End: 2023-06-22
  Filled 2023-06-06: qty 90, 30d supply, fill #0

## 2023-03-23 MED ORDER — ESCITALOPRAM OXALATE 20 MG PO TABS
20.0000 mg | ORAL_TABLET | Freq: Every day | ORAL | 1 refills | Status: DC
Start: 2023-03-23 — End: 2023-06-22
  Filled 2023-03-23: qty 90, 90d supply, fill #0
  Filled 2023-06-06: qty 30, 30d supply, fill #1

## 2023-03-23 NOTE — Progress Notes (Signed)
Shannon Swanson [id number] 03/25/1971 52 y.o.  Subjective:   Patient ID:  Shannon Swanson is a 51 y.o. (DOB February 04, 1971) female.  Chief Complaint:  Chief Complaint  Patient presents with   Anxiety    HPI [name] presents to the office today for follow-up of anxiety, ADHD, and depression. She reports that she has had some recent health issues that have caused anxiety and worry. She reports pitting edema and having medical work-up to rule out medical causes. She reports that she is occasionally having difficulty walking after prolonged periods of sitting. At times she has had to advocate for herself in terms of getting ordered tests completed in a timely manner. She reports that she is trying to avoid catastrophic thoughts. She reports that she has not had a panic attack- "but really close." Reports continued exaggerated startle response. Reports exaggerated startle response when someone else was startled and screamed. Continues hypervigilance. She reports occasional nightmares. She reports perfectionistic tendencies. Some checking behaviors around safety (doors, locks, etc). She reports that she will want things put away and neat before someone comes over and is no longer physically able to do all these things and will ask her sons to do these things. She reports some recent rumination.   She reports poor sleep. She reports that her energy and motivation have been slightly higher since people have been around. Denies SI.   She has been trying to help a friend from church that is going through a difficult situation. She reports that at times this has caused her to feel "overwhelmed and anxious." She reports some things about friend's situation are triggering memories of a past traumatic event.   She reports difficulty swallowing L-methylfolate tabs.   Methylphenidate last filled 03/07/23. Diazepam last filled 01/17/23.   Past Psychiatric Medication Trials: Lexapro Wellbutrin SR-side  effects Wellbutrin XL-side effects Diazepam- Took x 1. Ritalin Methylphenidate ER  Flowsheet Row Admission (Discharged) from 12/01/2021 in [facility] ED from 11/20/2021 in [facility] Emergency Department at [facility] Admission (Discharged) from 09/07/2020 in [facility]  C-SSRS RISK CATEGORY No Risk No Risk No Risk        Review of Systems:  Review of Systems  Cardiovascular:  Positive for leg swelling.  Musculoskeletal:        Reports stiffness after sitting and standing.  Neurological:  Positive for dizziness and light-headedness.  Psychiatric/Behavioral:         Please refer to HPI    Medications: I have reviewed the patient's current medications.  Current Outpatient Medications  Medication Sig Dispense Refill   [START ON 05/02/2023] methylphenidate (RITALIN) 5 MG tablet Take 1 tablet (5 mg total) by mouth 3 (three) times daily. 90 tablet 0   acetaminophen (TYLENOL) 325 MG tablet Take 650 mg by mouth every 6 (six) hours as needed.     diazepam (VALIUM) 2 MG tablet Take 1/2 - 1 tablet by mouth twice daily as needed for panic or PTSD symptoms 30 tablet 1   escitalopram (LEXAPRO) 20 MG tablet Take 1 tablet (20 mg total) by mouth daily. 90 tablet 1   ezetimibe-simvastatin (VYTORIN) 10-20 MG tablet Take 1 tablet by mouth daily at 6 PM. 30 tablet 2   ibuprofen (ADVIL) 200 MG tablet Take 200 mg by mouth every 6 (six) hours as needed.     ketorolac (TORADOL) 10 MG tablet Take 1 tablet (10 mg total) by mouth every 6 (six) hours as needed. 5  tablet 0   L-Methylfolate 15 MG TABS Take 1 tablet (15 mg total) by mouth daily. (Patient not taking: Reported on 03/23/2023) 30 tablet 5   lactase (LACTAID) 3000 UNITS tablet Take 9,000 Units by mouth 3 (three) times daily with meals.     meloxicam (MOBIC) 7.5 MG tablet Take 1 tablet (7.5 mg total) by mouth 2 (two) times daily. 60 tablet 0   [START ON 04/04/2023] methylphenidate (RITALIN) 5 MG tablet Take 1  tablet (5 mg total) by mouth 3 times daily. 90 tablet 0   ondansetron (ZOFRAN-ODT) 4 MG disintegrating tablet Take 1 tablet (4 mg total) by mouth every 8 (eight) hours as needed for nausea or vomiting. 20 tablet 0   tiZANidine (ZANAFLEX) 4 MG tablet Take 1 tablet (4 mg total) by mouth 3 (three) times daily as needed. 30 tablet 0   tiZANidine (ZANAFLEX) 4 MG tablet Take 1 tablet (4 mg total) by mouth 3 (three) times daily as needed. 30 tablet 0   traMADol (ULTRAM) 50 MG tablet Take 1 tablet by mouth every 6 hours as needed 40 tablet 0   traMADol (ULTRAM) 50 MG tablet Take 1 tablet (50 mg total) by mouth every 6 (six) hours as needed. 40 tablet 0   No current facility-administered medications for this visit.    Medication Side Effects: None  Allergies:  Allergies  Allergen Reactions   Meperidine Hcl Other (See Comments)    Severe headache   Lactose Intolerance (Gi)     Upset stomach    Codeine Nausea Only    TAKES ZOFRAN WITH FOR NAUSEA    Past Medical History:  Diagnosis Date   Anxiety    Attention deficit disorder (ADD)    Blood clot in vein 08/2020   after 08-2020 surgery post op   Complication of anesthesia 08/2020   limited neck mobility since cervical fusion, sleeps up on 2 pillows, right shoulder rotator cuff did noty heal properly after surgery   Depression    Dysrhythmia 2022   PACs- after epidural steroid injection in neck   Family history of adverse reaction to anesthesia    Mother, PONV and "difficult to wake up"   History of COVID-19    2021 or 2022 all symptoms resolved   History of kidney stones    MVP (mitral valve prolapse)    diagnosed at 16. Per notes from Dr. Oliver Barre in 2011, "no 2D echo evidence"   Nerve pain    per patient in bilateral hands fromn neck injury   PONV (postoperative nausea and vomiting)     Past Medical History, Surgical history, Social history, and Family history were reviewed and updated as appropriate.   Please see review of  systems for further details on the patient's review from today.   Objective:   Physical Exam:  LMP  (LMP Unknown)   Physical Exam Constitutional:      General: She is not in acute distress. Musculoskeletal:        General: No deformity.  Neurological:     Mental Status: She is alert and oriented to person, place, and time.     Coordination: Coordination normal.  Psychiatric:        Attention and Perception: Attention and perception normal. She does not perceive auditory or visual hallucinations.        Mood and Affect: Mood is anxious and depressed. Affect is not labile, blunt, angry or inappropriate.        Speech: Speech normal.  Behavior: Behavior normal.        Thought Content: Thought content normal. Thought content is not paranoid or delusional. Thought content does not include homicidal or suicidal ideation. Thought content does not include homicidal or suicidal plan.        Cognition and Memory: Cognition and memory normal.        Judgment: Judgment normal.     Comments: Insight intact     Lab Review:     Component Value Date/Time   NA 140 11/20/2021 1632   K 3.7 11/20/2021 1632   CL 106 11/20/2021 1632   CO2 26 11/20/2021 1632   GLUCOSE 130 (H) 11/20/2021 1632   BUN 12 11/20/2021 1632   CREATININE 0.99 11/20/2021 1632   CALCIUM 9.5 11/20/2021 1632   PROT 6.8 11/20/2021 1632   ALBUMIN 4.2 11/20/2021 1632   AST 24 11/20/2021 1632   ALT 26 11/20/2021 1632   ALKPHOS 77 11/20/2021 1632   BILITOT 0.6 11/20/2021 1632   GFRNONAA >60 11/20/2021 1632       Component Value Date/Time   WBC 19.3 (H) 11/20/2021 1632   RBC 4.48 11/20/2021 1632   HGB 13.8 11/20/2021 1632   HCT 40.9 11/20/2021 1632   PLT 301 11/20/2021 1632   MCV 91.3 11/20/2021 1632   MCH 30.8 11/20/2021 1632   MCHC 33.7 11/20/2021 1632   RDW 12.2 11/20/2021 1632   LYMPHSABS 1.7 10/30/2020 1116   MONOABS 0.4 10/30/2020 1116   EOSABS 0.1 10/30/2020 1116   BASOSABS 0.1 10/30/2020 1116     No results found for: "POCLITH", "LITHIUM"   No results found for: "PHENYTOIN", "PHENOBARB", "VALPROATE", "CBMZ"   .res Assessment: Plan:    40 minutes spent dedicated to the care of this patient on the date of this encounter to include pre-visit review of records, ordering of medication, post visit documentation, and face-to-face time with the patient discussing recent triggering events that have caused some increased anxiety and depression. Recommended discussing these events with therapist and encouraged pt to set boundaries when needed to minimize worsening anxiety. Pt reports that she prefers not to change medications at this time due to history of adverse effects with several medications in the past.  Will continue Lexapro 20 mg daily for anxiety and depression.  Will discontinue L-methylfolate 15 mg daily since these tablets are difficult for her to swallow.  Continue Diazepam 2 mg 1/2-1 tablet twice daily as needed for panic or PTSD symptoms.  Continue Ritalin 5 mg three times daily for ADHD.  Pt to follow-up in 3 months or sooner if clinically indicated.  Recommend continuing therapy with Stevphen Meuse, [facility].  Patient advised to contact office with any questions, adverse effects, or acute worsening in signs and symptoms.   Kenae was seen today for anxiety.  Diagnoses and all orders for this visit:  Post traumatic stress disorder (PTSD) -     escitalopram (LEXAPRO) 20 MG tablet; Take 1 tablet (20 mg total) by mouth daily.  ADHD, predominantly inattentive type -     methylphenidate (RITALIN) 5 MG tablet; Take 1 tablet (5 mg total) by mouth 3 times daily. -     methylphenidate (RITALIN) 5 MG tablet; Take 1 tablet (5 mg total) by mouth 3 (three) times daily.  Generalized anxiety disorder -     escitalopram (LEXAPRO) 20 MG tablet; Take 1 tablet (20 mg total) by mouth daily. -     diazepam (VALIUM) 2 MG tablet; Take 1/2 - 1 tablet by mouth twice  daily as needed for panic or  PTSD symptoms  Mixed obsessional thoughts and acts -     escitalopram (LEXAPRO) 20 MG tablet; Take 1 tablet (20 mg total) by mouth daily. -     diazepam (VALIUM) 2 MG tablet; Take 1/2 - 1 tablet by mouth twice daily as needed for panic or PTSD symptoms  Current mild episode of major depressive disorder without prior episode (HCC) -     escitalopram (LEXAPRO) 20 MG tablet; Take 1 tablet (20 mg total) by mouth daily.     Please see After Visit Summary for patient specific instructions.  Future Appointments  Date Time Provider Department Center  04/06/2023 10:00 AM Stevphen Meuse, [facility] CP-CP None  05/18/2023 10:00 AM Stevphen Meuse, [name] CP-CP None  06/15/2023  9:00 AM Stevphen Meuse, [facility] CP-CP None  06/22/2023 10:00 AM Corie Chiquito, [facility]NP CP-CP None  07/20/2023 10:00 AM Stevphen Meuse, [facility] CP-CP None    No orders of the defined types were placed in this encounter.   -------------------------------

## 2023-03-27 ENCOUNTER — Encounter: Payer: Self-pay | Admitting: Cardiology

## 2023-03-27 NOTE — Progress Notes (Signed)
02/24/23: No PE

## 2023-03-28 ENCOUNTER — Ambulatory Visit: Payer: 59 | Admitting: Cardiology

## 2023-04-06 ENCOUNTER — Ambulatory Visit (INDEPENDENT_AMBULATORY_CARE_PROVIDER_SITE_OTHER): Payer: 59 | Admitting: Psychiatry

## 2023-04-06 DIAGNOSIS — F4310 Post-traumatic stress disorder, unspecified: Secondary | ICD-10-CM

## 2023-04-06 NOTE — Progress Notes (Signed)
[facility] Counselor/Therapist Progress Note  Patient ID: Shannon Swanson, MRN: [id number],    Date: 04/06/2023  Time Spent: 57 minutes start time 10:10 AM end time 11:07 AM  Treatment Type: Individual Therapy  Reported Symptoms: anxiety, sadness, triggered responses, rumination, health issues, panic, flashbacks  Mental Status Exam:  Appearance:   Well Groomed     Behavior:  Appropriate  Motor:  Normal  Speech/Language:   Normal Rate  Affect:  Appropriate  Mood:  anxious  Thought process:  normal  Thought content:    WNL  Sensory/Perceptual disturbances:    WNL pain, patient had to be moved to an office where she could lay down due to the pain in her legs.  Orientation:  oriented to person, place, time/date, and situation  Attention:  Good  Concentration:  Good  Memory:  WNL  Fund of knowledge:   Good  Insight:    Good  Judgment:   Good  Impulse Control:  Good   Risk Assessment: Danger to Self:  No Self-injurious Behavior: No Danger to Others: No Duty to Warn:no Physical Aggression / Violence:No  Access to Firearms a concern: No  Gang Involvement:No   Subjective: Patient was present for session.  She shared she has been having lots of medical issues and is under going testing. She is trying to focus on good things rather than ruminating on the negative.She explained that she has been triggered recently by something that has happened with a friend.  Patient was given the opportunity to share the situation with her friend.  As she was discussing the situation she was encouraged to figure out what she can control and what she needs to let go of.  Patient was encouraged to let go of feeling that she had to understand anything and to work on caring about her friend which seemed to be helpful for her.  Through the processing of the situation she was able to realize that she continues to struggle with having a purpose which makes it difficult for her to get going in the  morning and feeling that she is enough.  Encouraged her to try and set up things with other people first thing in the morning to see if that helps give her a reason to get up and get going.  She was able to recognize that she could talk to her daughter about trying to come over and visit with one of the kids a couple times a week to give her daughter some time and to enjoy the contact with her grandchildren.  Also encouraged her to start researching Bible studies that may be running in the morning that she could attend with other women and trying to find some people she might could exercise with when she is able to get healthy.   Interventions: Solution-Oriented/Positive Psychology  Diagnosis:   ICD-10-CM   1. Post traumatic stress disorder (PTSD)  F43.10       Plan: Patient is to use CBT and coping skills to decrease triggered responses.    Patient is to work on developing a schedule of things that she can do first thing in the morning to get herself going and out of the house and focusing on a purpose.  Patient is to work on breaking things up into small pieces that she feels like she can accomplish.  Patient is to work on thinking of positives and affirming herself.  Patient is to look at podcast by Dr. De Burrs on  neuro cycle changes.  Patient is to work on reminding herself that she is enough and still has a purpose even when she is unable to work.  She is to try and focus on concrete things that she can still do.  Patient is to continue working with providers on her medical issues.  Patient is to take medication as directed   Stevphen Meuse, [facility]

## 2023-04-12 ENCOUNTER — Ambulatory Visit: Payer: 59 | Admitting: Cardiology

## 2023-04-26 ENCOUNTER — Telehealth: Payer: Self-pay | Admitting: Psychiatry

## 2023-04-26 ENCOUNTER — Other Ambulatory Visit (HOSPITAL_COMMUNITY): Payer: Self-pay

## 2023-04-26 ENCOUNTER — Other Ambulatory Visit: Payer: Self-pay

## 2023-04-26 DIAGNOSIS — I890 Lymphedema, not elsewhere classified: Secondary | ICD-10-CM | POA: Insufficient documentation

## 2023-04-26 MED ORDER — TIZANIDINE HCL 4 MG PO TABS
ORAL_TABLET | ORAL | 0 refills | Status: DC
  Filled 2023-04-26 – 2023-05-11 (×2): qty 30, 10d supply, fill #0

## 2023-04-26 MED ORDER — MELOXICAM 7.5 MG PO TABS
7.5000 mg | ORAL_TABLET | Freq: Two times a day (BID) | ORAL | 0 refills | Status: DC
  Filled 2023-04-26 – 2023-05-11 (×2): qty 60, 30d supply, fill #0

## 2023-04-26 MED ORDER — TRAMADOL HCL 50 MG PO TABS
50.0000 mg | ORAL_TABLET | Freq: Four times a day (QID) | ORAL | 0 refills | Status: DC | PRN
  Filled 2023-04-26 – 2023-05-11 (×2): qty 40, 10d supply, fill #0

## 2023-04-26 NOTE — Telephone Encounter (Signed)
Patient already has 2 RF available at the pharmacy. Notified her.

## 2023-04-26 NOTE — Telephone Encounter (Signed)
Leeann called and LM at 8:18 to request refill of her Ritalin 5mg  tabs.  Appt 12/12.    [place] [facility]

## 2023-04-26 NOTE — Progress Notes (Unsigned)
MRN : [id number]  Shannon Swanson is a 52 y.o. (Jun 28, 1971) female who presents with chief complaint of legs swell.  History of Present Illness:   Patient is seen for evaluation of leg swelling. The patient first noticed the swelling remotely but is now concerned because of a significant increase in the overall edema. The swelling isn't associated with significant pain.  There has been an increasing amount of  discoloration noted by the patient. The patient notes that in the morning the legs are improved but they steadily worsened throughout the course of the day. Elevation seems to make the swelling of the legs better, dependency makes them much worse.   There is no history of ulcerations associated with the swelling.   The patient denies any recent changes in their medications.  The patient has not been wearing graduated compression.  The patient has no had any past angiography, interventions or vascular surgery.  The patient denies a history of DVT or PE. There is no prior history of phlebitis. There is no history of primary lymphedema.  There is no history of radiation treatment to the groin or pelvis No history of malignancies. No history of trauma or groin or pelvic surgery. No history of foreign travel or parasitic infections area   No outpatient medications have been marked as taking for the 04/27/23 encounter (Appointment) with Gilda Crease, Latina Craver, MD.    Past Medical History:  Diagnosis Date   Anxiety    Attention deficit disorder (ADD)    Blood clot in vein 08/2020   after 08-2020 surgery post op   Complication of anesthesia 08/2020   limited neck mobility since cervical fusion, sleeps up on 2 pillows, right shoulder rotator cuff did noty heal properly after surgery   Depression    Dysrhythmia 2022   PACs- after epidural steroid injection in neck   Family history of adverse reaction to anesthesia    Mother, PONV and "difficult to wake  up"   History of COVID-19    2021 or 2022 all symptoms resolved   History of kidney stones    MVP (mitral valve prolapse)    diagnosed at 16. Per notes from Dr. Oliver Barre in 2011, "no 2D echo evidence"   Nerve pain    per patient in bilateral hands fromn neck injury   PONV (postoperative nausea and vomiting)     Past Surgical History:  Procedure Laterality Date   ANTERIOR CERVICAL DECOMP/DISCECTOMY FUSION N/A 09/07/2020   Procedure: Anterior Cervical Decompression Fusion - Cervical four-Cervical five - Cervical six-Cervical seven with removal Cervical five-six;  Surgeon: Donalee Citrin, MD;  Location: [facility] OR;  Service: Neurosurgery;  Laterality: N/A;   CERVICAL DISCECTOMY  2007   anterior approach 2007 Dr Wynetta Emery    CYSTOSCOPY WITH RETROGRADE PYELOGRAM, URETEROSCOPY AND STENT PLACEMENT Bilateral 12/01/2021   Procedure: CYSTOSCOPY WITH RETROGRADE PYELOGRAM, URETEROSCOPY AND STENT PLACEMENT;  Surgeon: Sebastian Ache, MD;  Location: [facility];  Service: Urology;  Laterality: Bilateral;  1 HR   DILATION AND CURETTAGE OF UTERUS     HOLMIUM LASER APPLICATION Bilateral 12/01/2021   Procedure: HOLMIUM LASER APPLICATION;  Surgeon: Sebastian Ache, MD;  Location: [facility];  Service: Urology;  Laterality: Bilateral;   LUMBAR DISC SURGERY     rotator cuff repair Right 06/2019   partial repair  by Dr. Ave Filter   TONSILLECTOMY      Social History Social History   Tobacco Use   Smoking status: Never   Smokeless tobacco: Never  Vaping Use   Vaping status: Never Used  Substance Use Topics   Alcohol use: No   Drug use: No    Family History Family History  Problem Relation Age of Onset   Diabetes Mother    Mitral valve prolapse Mother    Hiatal hernia Mother    Emphysema Father    Hyperlipidemia Sister    Diabetes Sister    Pulmonary embolism Sister    Diabetes Maternal Aunt    Diabetes Maternal Uncle     Allergies  Allergen Reactions   Meperidine Hcl  Other (See Comments)    Severe headache   Lactose Intolerance (Gi)     Upset stomach    Codeine Nausea Only    TAKES ZOFRAN WITH FOR NAUSEA     REVIEW OF SYSTEMS (Negative unless checked)  Constitutional: [] Weight loss  [] Fever  [] Chills Cardiac: [] Chest pain   [] Chest pressure   [] Palpitations   [] Shortness of breath when laying flat   [] Shortness of breath with exertion. Vascular:  [] Pain in legs with walking   [x] Pain in legs with standing  [] History of DVT   [] Phlebitis   [x] Swelling in legs   [] Varicose veins   [] Non-healing ulcers Pulmonary:   [] Uses home oxygen   [] Productive cough   [] Hemoptysis   [] Wheeze  [] COPD   [] Asthma Neurologic:  [] Dizziness   [] Seizures   [] History of stroke   [] History of TIA  [] Aphasia   [] Vissual changes   [] Weakness or numbness in arm   [] Weakness or numbness in leg Musculoskeletal:   [] Joint swelling   [] Joint pain   [] Low back pain Hematologic:  [] Easy bruising  [] Easy bleeding   [] Hypercoagulable state   [] Anemic Gastrointestinal:  [] Diarrhea   [] Vomiting  [] Gastroesophageal reflux/heartburn   [] Difficulty swallowing. Genitourinary:  [] Chronic kidney disease   [] Difficult urination  [] Frequent urination   [] Blood in urine Skin:  [] Rashes   [] Ulcers  Psychological:  [x] History of anxiety   []  History of major depression.  Physical Examination  There were no vitals filed for this visit. There is no height or weight on file to calculate BMI. Gen: WD/WN, NAD Head: [place]/AT, No temporalis wasting.  Ear/Nose/Throat: Hearing grossly intact, nares w/o erythema or drainage, pinna without lesions Eyes: PER, EOMI, sclera nonicteric.  Neck: Supple, no gross masses.  No JVD.  Pulmonary:  Good air movement, no audible wheezing, no use of accessory muscles.  Cardiac: RRR, precordium not hyperdynamic. Vascular:  scattered varicosities present bilaterally.  Mild venous stasis changes to the legs bilaterally.  3-4+ soft pitting edema, CEAP [id number]  Vessel  Right Left  Radial Palpable Palpable  Gastrointestinal: soft, non-distended. No guarding/no peritoneal signs.  Musculoskeletal: M/S 5/5 throughout.  No deformity.  Neurologic: CN 2-12 intact. Pain and light touch intact in extremities.  Symmetrical.  Speech is fluent. Motor exam as listed above. Psychiatric: Judgment intact, Mood & affect appropriate for pt's clinical situation. Dermatologic: Venous rashes no ulcers noted.  No changes consistent with cellulitis. Lymph : No lichenification or skin changes of chronic lymphedema.  CBC Lab Results  Component Value Date   WBC 19.3 (H) 11/20/2021   HGB 13.8 11/20/2021   HCT 40.9 11/20/2021   MCV 91.3 11/20/2021   PLT 301 11/20/2021    BMET    Component Value Date/Time   NA  140 11/20/2021 1632   K 3.7 11/20/2021 1632   CL 106 11/20/2021 1632   CO2 26 11/20/2021 1632   GLUCOSE 130 (H) 11/20/2021 1632   BUN 12 11/20/2021 1632   CREATININE 0.99 11/20/2021 1632   CALCIUM 9.5 11/20/2021 1632   GFRNONAA >60 11/20/2021 1632   CrCl cannot be calculated (Patient's most recent lab result is older than the maximum 21 days allowed.).  COAG Lab Results  Component Value Date   INR 0.9 10/30/2020    Radiology No results found.   Assessment/Plan There are no diagnoses linked to this encounter.   Levora Dredge, MD  04/26/2023 9:15 PM

## 2023-04-27 ENCOUNTER — Encounter (INDEPENDENT_AMBULATORY_CARE_PROVIDER_SITE_OTHER): Payer: Self-pay | Admitting: Vascular Surgery

## 2023-04-27 ENCOUNTER — Ambulatory Visit (INDEPENDENT_AMBULATORY_CARE_PROVIDER_SITE_OTHER): Payer: Self-pay | Admitting: Vascular Surgery

## 2023-04-27 VITALS — BP 128/87 | HR 98 | Resp 16 | Wt 177.8 lb

## 2023-04-27 DIAGNOSIS — I82622 Acute embolism and thrombosis of deep veins of left upper extremity: Secondary | ICD-10-CM | POA: Diagnosis not present

## 2023-04-27 DIAGNOSIS — I890 Lymphedema, not elsewhere classified: Secondary | ICD-10-CM

## 2023-04-28 ENCOUNTER — Other Ambulatory Visit (HOSPITAL_COMMUNITY): Payer: Self-pay

## 2023-04-29 ENCOUNTER — Other Ambulatory Visit (HOSPITAL_COMMUNITY): Payer: Self-pay

## 2023-05-01 ENCOUNTER — Other Ambulatory Visit (HOSPITAL_COMMUNITY): Payer: Self-pay

## 2023-05-06 ENCOUNTER — Other Ambulatory Visit (HOSPITAL_COMMUNITY): Payer: Self-pay

## 2023-05-10 ENCOUNTER — Other Ambulatory Visit (HOSPITAL_COMMUNITY): Payer: Self-pay

## 2023-05-11 ENCOUNTER — Other Ambulatory Visit (HOSPITAL_COMMUNITY): Payer: Self-pay

## 2023-05-18 ENCOUNTER — Telehealth (INDEPENDENT_AMBULATORY_CARE_PROVIDER_SITE_OTHER): Payer: Self-pay | Admitting: Vascular Surgery

## 2023-05-18 ENCOUNTER — Ambulatory Visit (INDEPENDENT_AMBULATORY_CARE_PROVIDER_SITE_OTHER): Payer: 59 | Admitting: Psychiatry

## 2023-05-18 DIAGNOSIS — F4310 Post-traumatic stress disorder, unspecified: Secondary | ICD-10-CM

## 2023-05-18 NOTE — Progress Notes (Signed)
[facility] Counselor/Therapist Progress Note  Patient ID: Shannon Swanson, MRN: [id number],    Date: 05/18/2023  Time Spent: 59 minutes start time 10:08 AM end time 11:07 AM  Treatment Type: Individual Therapy  Reported Symptoms: anxiety, chronic pain, crying spells, sadness, sleep issues, rumination, focusing issues  Mental Status Exam:  Appearance:   Well Groomed     Behavior:  Appropriate  Motor:  Restlestness  Speech/Language:   Normal Rate  Affect:  Appropriate and Tearful  Mood:  anxious and sad  Thought process:  normal  Thought content:    WNL  Sensory/Perceptual disturbances:    WNL  Orientation:  oriented to person, place, time/date, and situation  Attention:  Good  Concentration:  Good  Memory:  WNL  Fund of knowledge:   Good  Insight:    Good  Judgment:   Good  Impulse Control:  Good   Risk Assessment: Danger to Self:  No Self-injurious Behavior: No Danger to Others: No Duty to Warn:no Physical Aggression / Violence:No  Access to Firearms a concern: No  Gang Involvement:No   Subjective: Patient was present for session. She shared she has been emotional lately due to stress and pain. She shared her church is having widow's banquet and that is bringing up a lot for her. She shared she feels everything is triggering for her. She shared she is having fear over what else is going on physically.  She is waiting on hearing about tests that should be scheduled and she is unable to get herself to call the doctor's office to follow up with things.  During session the nurse called and they were able to get authorization to have the test scheduled.  As patient was discussing all that was going on with her she was able to recognize that her thoughts are keeping her very stuck and not allowing her to move in a more positive direction.  Discussed the importance of perception and recognizing that what is going on currently does not mean she will be in the same situation  forever.  Discussed the importance of recognizing there is hope that things can go in a positive direction.  Patient admitted that she has lots of issues with the what is going down to the worst case scenarios.  Discussed how to deal with them previously using CBT skills.  The importance of focusing on putting the right things into her thoughts so that she cannot intentionally move in that direction were discussed with patient.  Also encouraged her to focus on what she can do something about including diet and exercise and making sure she is researching and trying to grow in a healthy manner.  Interventions: Cognitive Behavioral Therapy and Solution-Oriented/Positive Psychology  Diagnosis:   ICD-10-CM   1. Post traumatic stress disorder (PTSD)  F43.10       Plan: Patient is to use CBT and coping skills to decrease triggered responses.    Patient is to work on developing a schedule of things that she can do first thing in the morning to get herself going and out of the house and focusing on a purpose.  Patient is to work on breaking things up into small pieces that she feels like she can accomplish.  Patient is to work on thinking of positives and affirming herself.  Patient is to look at podcast by Dr. De Burrs on neuro cycle changes.  Patient is to work on reminding herself that she is enough and still  has a purpose even when she is unable to work.  She is to try and focus on concrete things that she can still do.  Patient is to continue working with providers on her medical issues.  Patient is to take medication as directed   Stevphen Meuse, [facility]

## 2023-05-18 NOTE — Telephone Encounter (Signed)
Spoke with pt to advise CTV has been authorized. I advised [facility] would be the location and provided contact information. I asked pt to call to make appt for CTV and then return call to office to schedule a CT results appt with BIL reflux.  BIL reflux + CT results + see gs

## 2023-05-24 ENCOUNTER — Encounter: Payer: Self-pay | Admitting: Psychiatry

## 2023-05-29 ENCOUNTER — Encounter (INDEPENDENT_AMBULATORY_CARE_PROVIDER_SITE_OTHER): Payer: Self-pay | Admitting: Vascular Surgery

## 2023-05-30 ENCOUNTER — Ambulatory Visit
Admission: RE | Admit: 2023-05-30 | Discharge: 2023-05-30 | Disposition: A | Discharge status: Home / Self Care | Payer: 59 | Source: Ambulatory Visit | Attending: Vascular Surgery | Admitting: Vascular Surgery

## 2023-05-30 DIAGNOSIS — I890 Lymphedema, not elsewhere classified: Secondary | ICD-10-CM | POA: Diagnosis not present

## 2023-05-30 DIAGNOSIS — I82622 Acute embolism and thrombosis of deep veins of left upper extremity: Secondary | ICD-10-CM

## 2023-05-30 MED ORDER — IOPAMIDOL (ISOVUE-370) INJECTION 76%
100.0000 mL | Freq: Once | INTRAVENOUS | Status: AC | PRN
  Administered 2023-05-30: 100 mL via INTRAVENOUS

## 2023-06-01 IMAGING — MR MR SHOULDER*R* W/O CM
5 series · 40 of 40 positions shown · non-contrast
Comparison: Right shoulder radiographs 09/17/2018; MRI right
shoulder 09/22/2018
COMPARISON: Right shoulder radiographs 09/17/2018; MRI right
shoulder 09/22/2018

Addendum:
CLINICAL DATA: Right shoulder pain and weakness. Status post right
shoulder surgery 06/30/2019.

EXAM:
MRI OF THE RIGHT SHOULDER WITHOUT CONTRAST
TECHNIQUE: Multiplanar, multisequence MR imaging of the shoulder was performed.
No intravenous contrast was administered.

[Series 4: T2 fat-sat · axial · 4.0mm · 0.55mm/px · z∈[-72,+42]mm · 8 of 25 slices shown (1 of 3)]
[im 1/25]
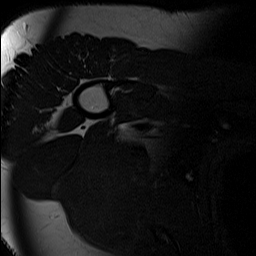
[im 4/25]
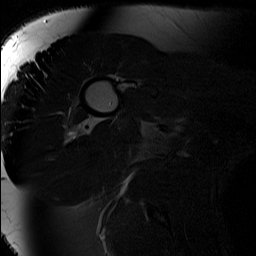
[im 7/25]
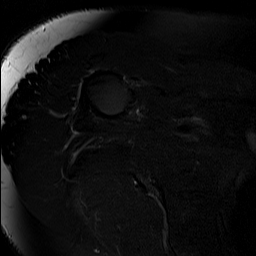
[im 11/25]
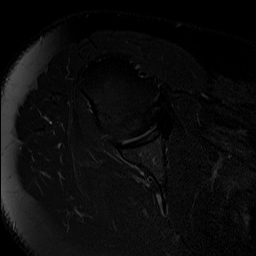
[im 14/25]
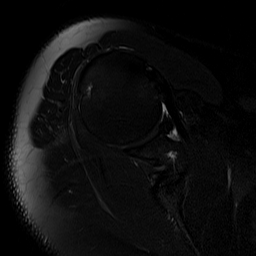
[im 18/25]
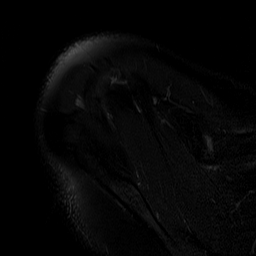
[im 21/25]
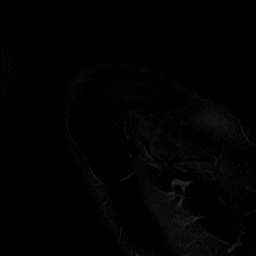
[im 25/25]
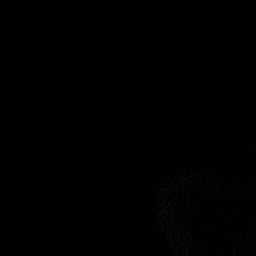

[Series 5: T2 fat-sat · oblique · 4.0mm · 0.55mm/px · 8 of 25 slices shown (2 of 3)]
[im 1/25]
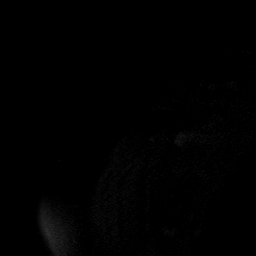
[im 4/25]
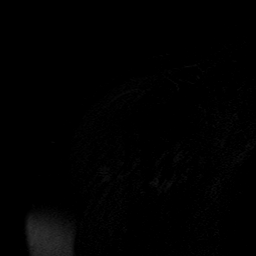
[im 7/25]
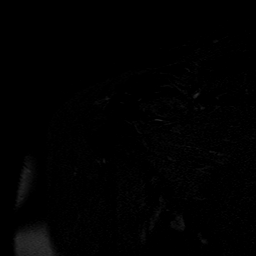
[im 11/25]
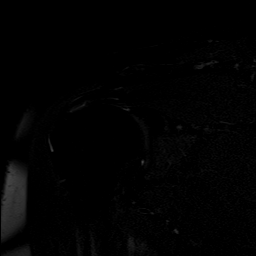
[im 14/25]
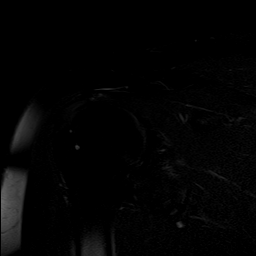
[im 18/25]
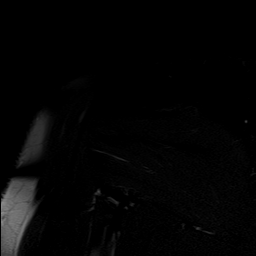
[im 21/25]
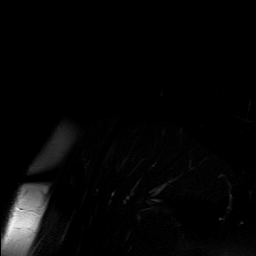
[im 25/25]
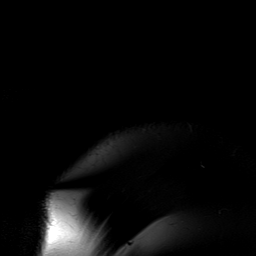

[Series 6: PD · oblique · 4.0mm · 0.55mm/px · 8 of 25 slices shown]
[im 1/25]
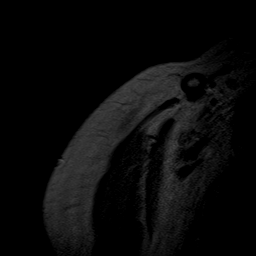
[im 4/25]
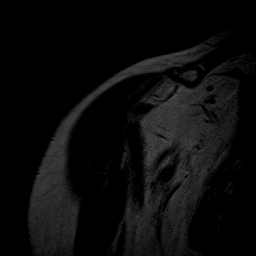
[im 7/25]
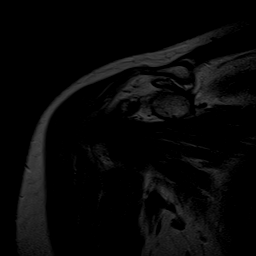
[im 11/25]
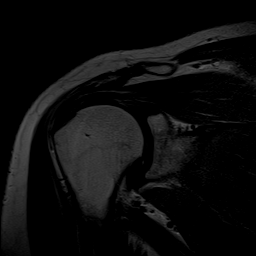
[im 14/25]
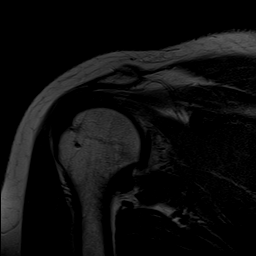
[im 18/25]
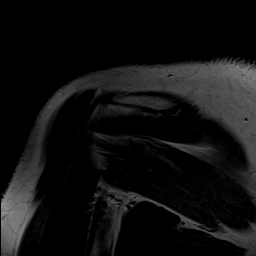
[im 21/25]
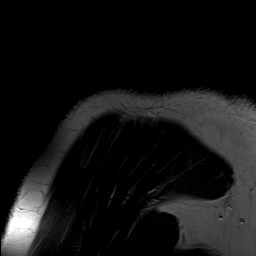
[im 25/25]
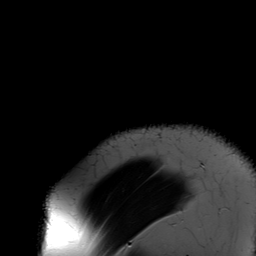

[Series 7: T1 · oblique · 4.0mm · 0.59mm/px · 8 of 25 slices shown]
[im 1/25]
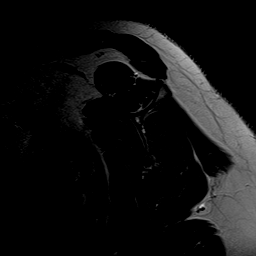
[im 4/25]
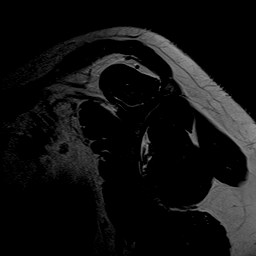
[im 7/25]
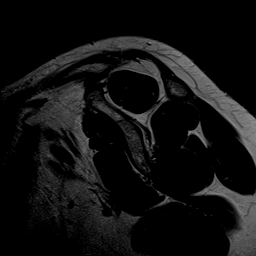
[im 11/25]
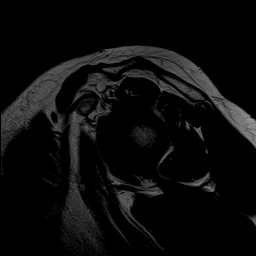
[im 14/25]
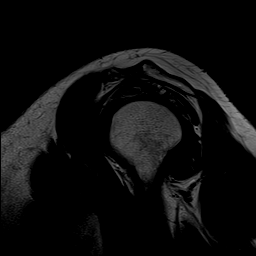
[im 18/25]
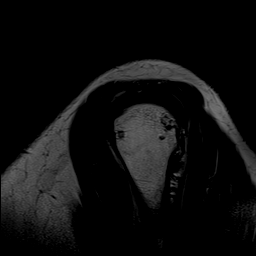
[im 21/25]
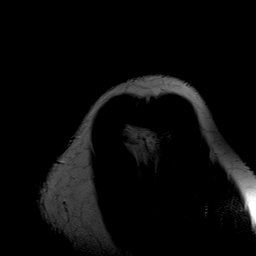
[im 25/25]
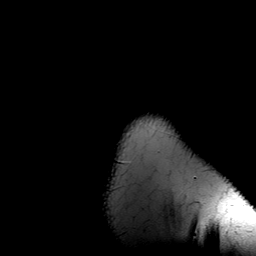

[Series 8: T2 fat-sat · oblique · 4.0mm · 0.59mm/px · 8 of 25 slices shown (3 of 3)]
[im 1/25]
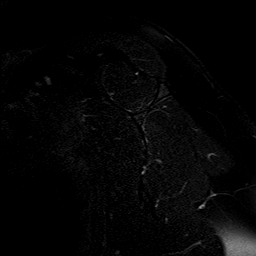
[im 4/25]
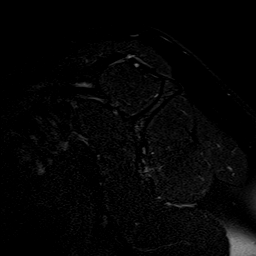
[im 7/25]
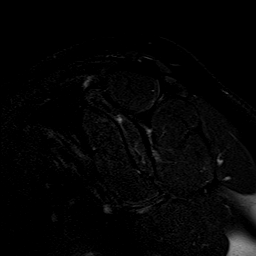
[im 11/25]
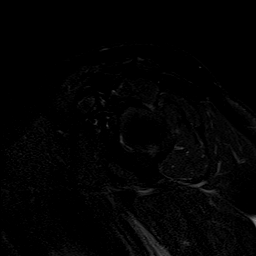
[im 14/25]
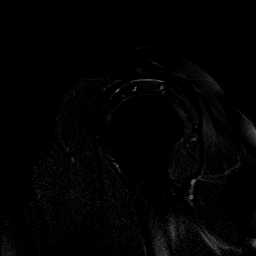
[im 18/25]
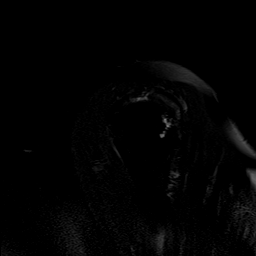
[im 21/25]
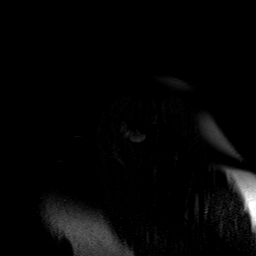
[im 25/25]
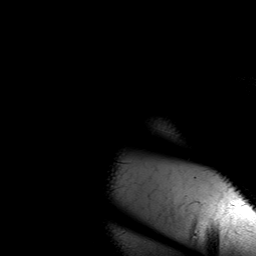

[40 of 40 positions shown; findings below may reference images not displayed]

FINDINGS: Rotator cuff: In the region of the prior posterior supraspinatus and
anterior infraspinatus high-grade tendinosis there is now a moderate
partial-thickness articular sided tear within the proximal tendon
footprint in the region measuring up to 10 mm in AP dimension
(sagittal image 18) involving a 7 mm length of the tendon (coronal
image 14) and involving approximately [DATE] of the craniocaudal tendon
thickness. There is moderate anterior infraspinatus tendinosis. The
subscapularis and teres minor are intact.

Muscles: No rotator cuff muscle atrophy, fatty infiltration, or
edema.

Biceps long head: The intra-articular long head of the biceps tendon
is intact.

Acromioclavicular Joint: Interval distal clavicle excision and
acromioplasty. Type 1 to type 2 acromion. Trace fluid within the
subacromial/subdeltoid bursa.

Glenohumeral Joint: Mild glenoid and humeral head cartilage
thinning. Minimal inferior humeral head-neck junction degenerative
osteophytosis.

Labrum: High-grade attenuation of the posterior glenoid labrum is
similar to prior.

Bones:  No acute fracture.

Other: None.
IMPRESSION: :
IMPRESSION: 1. In the region of the prior high-grade tendinosis on 09/22/2018
MRI, there is now a moderate partial-thickness articular sided tear
of the posterior supraspinatus and anterior infraspinatus tendon
fibers measuring up to 10 mm in AP dimension.
2. Interval distal clavicle excision and acromioplasty.
3. Mild glenohumeral osteoarthritis.

ADDENDUM:
A comparison study dated 02/10/2020 has become available for
comparison.

The current posterior supraspinatus partial-thickness articular
sided tear measuring 10 mm in AP dimension and 7 mm along the length
of the tendon (sagittal series 8, image 18 and coronal series 5,
image 14) is only minimally worsened from 02/10/2020 at which time
there was a tear measuring approximately 6 mm in AP dimension along
a 6 mm length of the tendon and with higher-grade underlying prior
tendinosis. The craniocaudal thickness of this tear appears to
involve approximately 20 percent of the craniocaudal thickness of
the tendon (coronal series 5, image 14), possibly subjectively
minimally worsened from prior.

The moderate anterior infraspinatus tendinosis appears mildly
increased from prior, however the prior mild fluid at the anterior
infraspinatus more proximal musculotendinous junction has decreased.

Redemonstration of distal clavicle excision and acromioplasty.

Otherwise no significant change.
IMPRESSION: Compared to a more recent 02/10/2020 comparison MRI:

1. Minimal interval increase in posterior supraspinatus
partial-thickness articular sided tear. Interval decrease in
underlying tendinosis.
2. Moderate anterior infraspinatus tendinosis appears mildly
increased but a prior tiny musculotendinous junction interstitial
tear is less visible.

*** End of Addendum ***
FINDINGS: Rotator cuff: In the region of the prior posterior supraspinatus and
anterior infraspinatus high-grade tendinosis there is now a moderate
partial-thickness articular sided tear within the proximal tendon
footprint in the region measuring up to 10 mm in AP dimension
(sagittal image 18) involving a 7 mm length of the tendon (coronal
image 14) and involving approximately [DATE] of the craniocaudal tendon
thickness. There is moderate anterior infraspinatus tendinosis. The
subscapularis and teres minor are intact.

Muscles: No rotator cuff muscle atrophy, fatty infiltration, or
edema.

Biceps long head: The intra-articular long head of the biceps tendon
is intact.

Acromioclavicular Joint: Interval distal clavicle excision and
acromioplasty. Type 1 to type 2 acromion. Trace fluid within the
subacromial/subdeltoid bursa.

Glenohumeral Joint: Mild glenoid and humeral head cartilage
thinning. Minimal inferior humeral head-neck junction degenerative
osteophytosis.

Labrum: High-grade attenuation of the posterior glenoid labrum is
similar to prior.

Bones:  No acute fracture.

Other: None.
IMPRESSION: :
IMPRESSION: 1. In the region of the prior high-grade tendinosis on 09/22/2018
MRI, there is now a moderate partial-thickness articular sided tear
of the posterior supraspinatus and anterior infraspinatus tendon
fibers measuring up to 10 mm in AP dimension.
2. Interval distal clavicle excision and acromioplasty.
3. Mild glenohumeral osteoarthritis.

## 2023-06-06 ENCOUNTER — Other Ambulatory Visit (HOSPITAL_COMMUNITY): Payer: Self-pay

## 2023-06-06 MED ORDER — MELOXICAM 7.5 MG PO TABS
7.5000 mg | ORAL_TABLET | Freq: Two times a day (BID) | ORAL | 0 refills | Status: DC
  Filled 2023-06-06: qty 60, 30d supply, fill #0

## 2023-06-06 MED ORDER — TIZANIDINE HCL 4 MG PO TABS
4.0000 mg | ORAL_TABLET | Freq: Three times a day (TID) | ORAL | 0 refills | Status: DC | PRN
  Filled 2023-06-06: qty 30, 10d supply, fill #0

## 2023-06-06 MED ORDER — TRAMADOL HCL 50 MG PO TABS
50.0000 mg | ORAL_TABLET | Freq: Four times a day (QID) | ORAL | 0 refills | Status: DC | PRN
Start: 2023-06-06 — End: 2023-08-10
  Filled 2023-06-06: qty 40, 10d supply, fill #0

## 2023-06-07 ENCOUNTER — Other Ambulatory Visit (HOSPITAL_COMMUNITY): Payer: Self-pay

## 2023-06-10 ENCOUNTER — Ambulatory Visit (HOSPITAL_COMMUNITY)
Admission: EM | Admit: 2023-06-10 | Discharge: 2023-06-10 | Disposition: A | Discharge status: Home / Self Care | Payer: 59 | Attending: Internal Medicine | Admitting: Internal Medicine

## 2023-06-10 ENCOUNTER — Encounter (HOSPITAL_COMMUNITY): Payer: Self-pay

## 2023-06-10 ENCOUNTER — Other Ambulatory Visit: Payer: Self-pay

## 2023-06-10 ENCOUNTER — Other Ambulatory Visit (HOSPITAL_COMMUNITY): Payer: Self-pay

## 2023-06-10 DIAGNOSIS — J069 Acute upper respiratory infection, unspecified: Secondary | ICD-10-CM

## 2023-06-10 MED ORDER — GUAIFENESIN 100 MG/5ML PO LIQD
100.0000 mg | ORAL | 0 refills | Status: DC | PRN
  Filled 2023-06-10: qty 60, 1d supply, fill #0

## 2023-06-10 MED ORDER — GUAIFENESIN 200 MG PO TABS
200.0000 mg | ORAL_TABLET | ORAL | 0 refills | Status: DC | PRN
  Filled 2023-06-10: qty 30, 5d supply, fill #0

## 2023-06-10 MED ORDER — FLUTICASONE PROPIONATE 50 MCG/ACT NA SUSP
1.0000 | Freq: Every day | NASAL | 0 refills | Status: AC
  Filled 2023-06-10: qty 16, fill #0
  Filled 2023-06-10: qty 16, 30d supply, fill #0

## 2023-06-10 NOTE — ED Provider Notes (Signed)
[facility]    CSN: [id number] Arrival date & time: 06/10/23  1009      History   Chief Complaint No chief complaint on file.   HPI Shannon Swanson is a 52 y.o. female.   Patient presents with cough, nasal congestion, ear pain, sore throat that started about 3 days ago.  Sore throat is intermittent.  She denies any known sick contacts or associated fever.  Reports that it feels like she has chest congestion and a deep cough.  She denies history of asthma or COPD.  Denies that she smokes cigarettes.  She has taken Tylenol and ibuprofen for symptoms.      Past Medical History:  Diagnosis Date   Anxiety    Attention deficit disorder (ADD)    Blood clot in vein 08/2020   after 08-2020 surgery post op   Complication of anesthesia 08/2020   limited neck mobility since cervical fusion, sleeps up on 2 pillows, right shoulder rotator cuff did noty heal properly after surgery   Depression    Dysrhythmia 2022   PACs- after epidural steroid injection in neck   Family history of adverse reaction to anesthesia    Mother, PONV and "difficult to wake up"   History of COVID-19    2021 or 2022 all symptoms resolved   History of kidney stones    MVP (mitral valve prolapse)    diagnosed at 16. Per notes from Dr. Oliver Barre in 2011, "no 2D echo evidence"   Nerve pain    per patient in bilateral hands fromn neck injury   PONV (postoperative nausea and vomiting)     Patient Active Problem List   Diagnosis Date Noted   Lymphedema 04/26/2023   DVT (deep venous thrombosis) (HCC) 09/17/2020   Spinal stenosis of cervical region 09/07/2020   Post traumatic stress disorder (PTSD) 06/16/2020   Mixed obsessional thoughts and acts 04/29/2018   ADHD, predominantly inattentive type 04/29/2018   Narcoleptic syndrome 04/29/2018   Dyspnea 08/28/2015   Chest pain, atypical 08/28/2015   B12 deficiency 11/26/2014   Contusion of leg, right 08/21/2014   Low back pain 06/19/2014    Generalized anxiety disorder 06/19/2014   Routine health maintenance 10/07/2013   PALPITATIONS, OCCASIONAL 10/02/2008   Mitral valve disorder 06/01/2007    Past Surgical History:  Procedure Laterality Date   ANTERIOR CERVICAL DECOMP/DISCECTOMY FUSION N/A 09/07/2020   Procedure: Anterior Cervical Decompression Fusion - Cervical four-Cervical five - Cervical six-Cervical seven with removal Cervical five-six;  Surgeon: Donalee Citrin, MD;  Location: [facility] OR;  Service: Neurosurgery;  Laterality: N/A;   CERVICAL DISCECTOMY  2007   anterior approach 2007 Dr Wynetta Emery    CYSTOSCOPY WITH RETROGRADE PYELOGRAM, URETEROSCOPY AND STENT PLACEMENT Bilateral 12/01/2021   Procedure: CYSTOSCOPY WITH RETROGRADE PYELOGRAM, URETEROSCOPY AND STENT PLACEMENT;  Surgeon: Sebastian Ache, MD;  Location: [facility];  Service: Urology;  Laterality: Bilateral;  1 HR   DILATION AND CURETTAGE OF UTERUS     HOLMIUM LASER APPLICATION Bilateral 12/01/2021   Procedure: HOLMIUM LASER APPLICATION;  Surgeon: Sebastian Ache, MD;  Location: [facility];  Service: Urology;  Laterality: Bilateral;   LUMBAR DISC SURGERY     rotator cuff repair Right 06/2019   partial repair by Dr. Ave Filter   TONSILLECTOMY      OB History   No obstetric history on file.      Home Medications    Prior to Admission medications   Medication Sig Start Date End Date  Taking? Authorizing Provider  diazepam (VALIUM) 2 MG tablet Take 1/2 - 1 tablet by mouth twice daily as needed for panic or PTSD symptoms 03/23/23  Yes Corie Chiquito, PMHNP  escitalopram (LEXAPRO) 20 MG tablet Take 1 tablet (20 mg total) by mouth daily. 03/23/23  Yes Corie Chiquito, PMHNP  ezetimibe-simvastatin (VYTORIN) 10-20 MG tablet Take 1 tablet by mouth daily at 6 PM. 03/20/23  Yes Yates Decamp, MD  fluticasone (FLONASE) 50 MCG/ACT nasal spray Place 1 spray into both nostrils daily. 06/10/23  Yes Cythia Bachtel, Acie Fredrickson, FNP  guaiFENesin (ROBITUSSIN) 100 MG/5ML liquid  Take 5-10 mLs (100-200 mg total) by mouth every 4 (four) hours as needed for cough or to loosen phlegm. 06/10/23  Yes Sunny Aguon, Rolly Salter E, FNP  lactase (LACTAID) 3000 UNITS tablet Take 9,000 Units by mouth 3 (three) times daily with meals.   Yes [provider]  meloxicam (MOBIC) 7.5 MG tablet Take 1 tablet (7.5 mg total) by mouth 2 (two) times daily. 06/06/23  Yes   methylphenidate (RITALIN) 5 MG tablet Take 1 tablet (5 mg total) by mouth 3 times daily. 04/04/23  Yes Corie Chiquito, PMHNP  ondansetron (ZOFRAN-ODT) 4 MG disintegrating tablet Take 1 tablet (4 mg total) by mouth every 8 (eight) hours as needed for nausea or vomiting. 11/20/21  Yes Cristina Gong, PA-C  tiZANidine (ZANAFLEX) 4 MG tablet Take 1 tablet (4 mg total) by mouth 3 (three) times daily as needed. 11/27/20  Yes   tiZANidine (ZANAFLEX) 4 MG tablet Take 1 tablet (4 mg total) by mouth 3 (three) times daily as needed. 06/06/23  Yes   traMADol (ULTRAM) 50 MG tablet Take 1 tablet by mouth every 6 hours as needed 12/03/21  Yes   acetaminophen (TYLENOL) 325 MG tablet Take 650 mg by mouth every 6 (six) hours as needed.    [provider]  ibuprofen (ADVIL) 200 MG tablet Take 200 mg by mouth every 6 (six) hours as needed.    [provider]  ketorolac (TORADOL) 10 MG tablet Take 1 tablet (10 mg total) by mouth every 6 (six) hours as needed. Patient not taking: Reported on 04/27/2023 12/03/21     methylphenidate (RITALIN) 5 MG tablet Take 1 tablet (5 mg total) by mouth 3 (three) times daily. 05/02/23   Corie Chiquito, PMHNP  traMADol (ULTRAM) 50 MG tablet Take 1 tablet (50 mg total) by mouth every 6 (six) hours as needed. 06/06/23       Family History Family History  Problem Relation Age of Onset   Diabetes Mother    Mitral valve prolapse Mother    Hiatal hernia Mother    Emphysema Father    Hyperlipidemia Sister    Diabetes Sister    Pulmonary embolism Sister    Diabetes Maternal Aunt    Diabetes  Maternal Uncle     Social History Social History   Tobacco Use   Smoking status: Never   Smokeless tobacco: Never  Vaping Use   Vaping status: Never Used  Substance Use Topics   Alcohol use: No   Drug use: No     Allergies   Meperidine hcl, Lactose intolerance (gi), and Codeine   Review of Systems Review of Systems Per HPI  Physical Exam Triage Vital Signs ED Triage Vitals  Encounter Vitals Group     BP 06/10/23 1037 (!) 165/96     Systolic BP Percentile --      Diastolic BP Percentile --      Pulse Rate 06/10/23  1037 99     Resp 06/10/23 1037 18     Temp 06/10/23 1037 98.7 F (37.1 C)     Temp Source 06/10/23 1037 Oral     SpO2 06/10/23 1037 97 %     Weight --      Height --      Head Circumference --      Peak Flow --      Pain Score 06/10/23 1040 0     Pain Loc --      Pain Education --      Exclude from Growth Chart --    No data found.  Updated Vital Signs BP (!) 165/96 (BP Location: Left Arm)   Pulse 99   Temp 98.7 F (37.1 C) (Oral)   Resp 18   LMP  (LMP Unknown)   SpO2 97%   Visual Acuity Right Eye Distance:   Left Eye Distance:   Bilateral Distance:    Right Eye Near:   Left Eye Near:    Bilateral Near:     Physical Exam Constitutional:      General: She is not in acute distress.    Appearance: Normal appearance. She is not toxic-appearing or diaphoretic.  HENT:     Head: Normocephalic and atraumatic.     Right Ear: Ear canal normal. No drainage, swelling or tenderness. A middle ear effusion is present. Tympanic membrane is not perforated, erythematous or bulging.     Left Ear: Ear canal normal. No drainage, swelling or tenderness. A middle ear effusion is present. Tympanic membrane is not perforated, erythematous or bulging.     Nose: Congestion present.     Mouth/Throat:     Mouth: Mucous membranes are moist.     Pharynx: No posterior oropharyngeal erythema.  Eyes:     Extraocular Movements: Extraocular movements intact.      Conjunctiva/sclera: Conjunctivae normal.     Pupils: Pupils are equal, round, and reactive to light.  Cardiovascular:     Rate and Rhythm: Normal rate and regular rhythm.     Pulses: Normal pulses.     Heart sounds: Normal heart sounds.  Pulmonary:     Effort: Pulmonary effort is normal. No respiratory distress.     Breath sounds: Normal breath sounds. No stridor. No wheezing, rhonchi or rales.  Musculoskeletal:        General: Normal range of motion.     Cervical back: Normal range of motion.  Skin:    General: Skin is warm and dry.  Neurological:     General: No focal deficit present.     Mental Status: She is alert and oriented to person, place, and time. Mental status is at baseline.  Psychiatric:        Mood and Affect: Mood normal.        Behavior: Behavior normal.      UC Treatments / Results  Labs (all labs ordered are listed, but only abnormal results are displayed) Labs Reviewed - No data to display  EKG   Radiology No results found.  Procedures Procedures (including critical care time)  Medications Ordered in UC Medications - No data to display  Initial Impression / Assessment and Plan / UC Course  I have reviewed the triage vital signs and the nursing notes.  Pertinent labs & imaging results that were available during my care of the patient were reviewed by me and considered in my medical decision making (see chart for details).     Patient  presents with symptoms likely from a viral upper respiratory infection.  Do not suspect underlying cardiopulmonary process. Symptoms seem unlikely related to ACS, CHF or COPD exacerbations, pneumonia, pneumothorax. Patient is nontoxic appearing and not in need of emergent medical intervention.  Patient declined COVID testing.  Rapid strep not necessary given appearance of posterior pharynx on exam.  There are no adventitious lung sounds on exam and oxygen is normal so do not think that chest imaging is  necessary.  Recommended symptom control with medications, fluids, supportive care, symptom management.  Will prescribe guaifenesin and Flonase.  Patient originally prescribed guaifenesin pills but requested liquid medication so pills were discontinued at pharmacy and patient was sent Robitussin.  Blood pressure is elevated with recheck being similar.  Suspect it is due to acute illness as patient has no prior history of hypertension.  Patient advised to monitor blood pressure at home and follow-up if it remains elevated.  Return if symptoms fail to improve. Patient states understanding and is agreeable.  Discharged with PCP followup.  Final Clinical Impressions(s) / UC Diagnoses   Final diagnoses:  Viral upper respiratory tract infection with cough     Discharge Instructions      As we discussed, it appears that you have a viral illness.  I have prescribed you 2 medications to help alleviate symptoms.  Ensure adequate fluids and rest.  Follow-up if any symptoms persist or worsen.  Monitor blood pressure at home.    ED Prescriptions     Medication Sig Dispense Auth. Provider   guaiFENesin 200 MG tablet  (Status: Discontinued) Take 1 tablet (200 mg total) by mouth every 4 (four) hours as needed for cough or to loosen phlegm. 30 tablet [place], McDonald E, [place]   fluticasone [facility]) 50 MCG/ACT nasal spray Place 1 spray into both nostrils daily. 16 g Ervin Knack E, [place]   guaiFENesin (ROBITUSSIN) 100 MG/5ML liquid Take 5-10 mLs (100-200 mg total) by mouth every 4 (four) hours as needed for cough or to loosen phlegm. 60 mL Gustavus Bryant, [place]      PDMP not reviewed this encounter.   Gustavus Bryant, [place] 06/10/23 1126

## 2023-06-10 NOTE — ED Triage Notes (Signed)
Pt is here with cough, congestion,ear pain and sore throat x 3 days. Pt reports she has a deep cough.

## 2023-06-10 NOTE — Discharge Instructions (Signed)
As we discussed, it appears that you have a viral illness.  I have prescribed you 2 medications to help alleviate symptoms.  Ensure adequate fluids and rest.  Follow-up if any symptoms persist or worsen.  Monitor blood pressure at home.

## 2023-06-13 DIAGNOSIS — R202 Paresthesia of skin: Secondary | ICD-10-CM | POA: Diagnosis not present

## 2023-06-13 DIAGNOSIS — K760 Fatty (change of) liver, not elsewhere classified: Secondary | ICD-10-CM | POA: Diagnosis not present

## 2023-06-13 DIAGNOSIS — K7689 Other specified diseases of liver: Secondary | ICD-10-CM | POA: Diagnosis not present

## 2023-06-13 DIAGNOSIS — M25669 Stiffness of unspecified knee, not elsewhere classified: Secondary | ICD-10-CM | POA: Diagnosis not present

## 2023-06-13 DIAGNOSIS — M791 Myalgia, unspecified site: Secondary | ICD-10-CM | POA: Diagnosis not present

## 2023-06-13 DIAGNOSIS — R0602 Shortness of breath: Secondary | ICD-10-CM | POA: Diagnosis not present

## 2023-06-13 DIAGNOSIS — M25549 Pain in joints of unspecified hand: Secondary | ICD-10-CM | POA: Diagnosis not present

## 2023-06-15 ENCOUNTER — Ambulatory Visit (INDEPENDENT_AMBULATORY_CARE_PROVIDER_SITE_OTHER): Payer: 59

## 2023-06-15 ENCOUNTER — Encounter (INDEPENDENT_AMBULATORY_CARE_PROVIDER_SITE_OTHER): Payer: Self-pay | Admitting: Vascular Surgery

## 2023-06-15 ENCOUNTER — Ambulatory Visit: Payer: 59 | Admitting: Psychiatry

## 2023-06-15 ENCOUNTER — Ambulatory Visit (INDEPENDENT_AMBULATORY_CARE_PROVIDER_SITE_OTHER): Payer: 59 | Admitting: Vascular Surgery

## 2023-06-15 VITALS — BP 130/86 | HR 106 | Resp 18 | Ht 68.0 in | Wt 177.8 lb

## 2023-06-15 DIAGNOSIS — I82622 Acute embolism and thrombosis of deep veins of left upper extremity: Secondary | ICD-10-CM

## 2023-06-15 DIAGNOSIS — I890 Lymphedema, not elsewhere classified: Secondary | ICD-10-CM

## 2023-06-18 ENCOUNTER — Encounter (INDEPENDENT_AMBULATORY_CARE_PROVIDER_SITE_OTHER): Payer: Self-pay | Admitting: Vascular Surgery

## 2023-06-18 NOTE — Progress Notes (Signed)
MRN : [id number]  Shannon Swanson is a 52 y.o. (20-Jul-1970) female who presents with chief complaint of legs hurt and swell.  History of Present Illness:   The patient returns to the office for followup evaluation regarding leg swelling.  The swelling has improved quite a bit and the pain associated with swelling has decreased substantially. There have not been any interval development of a ulcerations or wounds.  Since the previous visit the patient has been wearing graduated compression stockings and has noted some improvement in the lymphedema. The patient has been using compression routinely morning until night.  The patient also states elevation during the day and exercise (such as walking) is being done too.    No outpatient medications have been marked as taking for the 06/15/23 encounter (Office Visit) with Gilda Crease, Latina Craver, MD.    Past Medical History:  Diagnosis Date   Anxiety    Attention deficit disorder (ADD)    Blood clot in vein 08/2020   after 08-2020 surgery post op   Complication of anesthesia 08/2020   limited neck mobility since cervical fusion, sleeps up on 2 pillows, right shoulder rotator cuff did noty heal properly after surgery   Depression    Dysrhythmia 2022   PACs- after epidural steroid injection in neck   Family history of adverse reaction to anesthesia    Mother, PONV and "difficult to wake up"   History of COVID-19    2021 or 2022 all symptoms resolved   History of kidney stones    MVP (mitral valve prolapse)    diagnosed at 16. Per notes from Dr. Oliver Barre in 2011, "no 2D echo evidence"   Nerve pain    per patient in bilateral hands fromn neck injury   PONV (postoperative nausea and vomiting)     Past Surgical History:  Procedure Laterality Date   ANTERIOR CERVICAL DECOMP/DISCECTOMY FUSION N/A 09/07/2020   Procedure: Anterior Cervical Decompression Fusion - Cervical four-Cervical five - Cervical six-Cervical seven with removal  Cervical five-six;  Surgeon: Donalee Citrin, MD;  Location: [facility] OR;  Service: Neurosurgery;  Laterality: N/A;   CERVICAL DISCECTOMY  2007   anterior approach 2007 Dr Wynetta Emery    CYSTOSCOPY WITH RETROGRADE PYELOGRAM, URETEROSCOPY AND STENT PLACEMENT Bilateral 12/01/2021   Procedure: CYSTOSCOPY WITH RETROGRADE PYELOGRAM, URETEROSCOPY AND STENT PLACEMENT;  Surgeon: Sebastian Ache, MD;  Location: [facility];  Service: Urology;  Laterality: Bilateral;  1 HR   DILATION AND CURETTAGE OF UTERUS     HOLMIUM LASER APPLICATION Bilateral 12/01/2021   Procedure: HOLMIUM LASER APPLICATION;  Surgeon: Sebastian Ache, MD;  Location: [facility];  Service: Urology;  Laterality: Bilateral;   LUMBAR DISC SURGERY     rotator cuff repair Right 06/2019   partial repair by Dr. Ave Filter   TONSILLECTOMY      Social History Social History   Tobacco Use   Smoking status: Never   Smokeless tobacco: Never  Vaping Use   Vaping status: Never Used  Substance Use Topics   Alcohol use: No   Drug use: No    Family History Family History  Problem Relation Age of Onset   Diabetes Mother    Mitral valve prolapse Mother    Hiatal hernia Mother    Emphysema Father    Hyperlipidemia Sister    Diabetes Sister    Pulmonary embolism Sister    Diabetes Maternal Aunt    Diabetes Maternal Uncle  Allergies  Allergen Reactions   Meperidine Hcl Other (See Comments)    Severe headache   Lactose Intolerance (Gi)     Upset stomach    Codeine Nausea Only    TAKES ZOFRAN WITH FOR NAUSEA     REVIEW OF SYSTEMS (Negative unless checked)  Constitutional: [] Weight loss  [] Fever  [] Chills Cardiac: [] Chest pain   [] Chest pressure   [] Palpitations   [] Shortness of breath when laying flat   [] Shortness of breath with exertion. Vascular:  [] Pain in legs with walking   [x] Pain in legs at rest  [] History of DVT   [] Phlebitis   [x] Swelling in legs   [] Varicose veins   [] Non-healing ulcers Pulmonary:    [] Uses home oxygen   [] Productive cough   [] Hemoptysis   [] Wheeze  [] COPD   [] Asthma Neurologic:  [] Dizziness   [] Seizures   [] History of stroke   [] History of TIA  [] Aphasia   [] Vissual changes   [] Weakness or numbness in arm   [] Weakness or numbness in leg Musculoskeletal:   [] Joint swelling   [] Joint pain   [] Low back pain Hematologic:  [] Easy bruising  [] Easy bleeding   [] Hypercoagulable state   [] Anemic Gastrointestinal:  [] Diarrhea   [] Vomiting  [] Gastroesophageal reflux/heartburn   [] Difficulty swallowing. Genitourinary:  [] Chronic kidney disease   [] Difficult urination  [] Frequent urination   [] Blood in urine Skin:  [] Rashes   [] Ulcers  Psychological:  [] History of anxiety   []  History of major depression.  Physical Examination  Vitals:   06/15/23 1534  BP: 130/86  Pulse: (!) 106  Resp: 18  Weight: 177 lb 12.8 oz (80.6 kg)  Height: 5\' 8"  (1.727 m)   Body mass index is 27.03 kg/m. Gen: WD/WN, NAD Head: [place]/AT, No temporalis wasting.  Ear/Nose/Throat: Hearing grossly intact, nares w/o erythema or drainage, pinna without lesions Eyes: PER, EOMI, sclera nonicteric.  Neck: Supple, no gross masses.  No JVD.  Pulmonary:  Good air movement, no audible wheezing, no use of accessory muscles.  Cardiac: RRR, precordium not hyperdynamic. Vascular:  scattered varicosities present bilaterally.  Moderate venous stasis changes to the legs bilaterally.  1-2+ soft pitting edema. CEAP [id number]   Vessel Right Left  Radial Palpable Palpable  Gastrointestinal: soft, non-distended. No guarding/no peritoneal signs.  Musculoskeletal: M/S 5/5 throughout.  No deformity.  Neurologic: CN 2-12 intact. Pain and light touch intact in extremities.  Symmetrical.  Speech is fluent. Motor exam as listed above. Psychiatric: Judgment intact, Mood & affect appropriate for pt's clinical situation. Dermatologic: Venous rashes no ulcers noted.  No changes consistent with cellulitis. Lymph : No lichenification or  skin changes of chronic lymphedema.  CBC Lab Results  Component Value Date   WBC 19.3 (H) 11/20/2021   HGB 13.8 11/20/2021   HCT 40.9 11/20/2021   MCV 91.3 11/20/2021   PLT 301 11/20/2021    BMET    Component Value Date/Time   NA 140 11/20/2021 1632   K 3.7 11/20/2021 1632   CL 106 11/20/2021 1632   CO2 26 11/20/2021 1632   GLUCOSE 130 (H) 11/20/2021 1632   BUN 12 11/20/2021 1632   CREATININE 0.99 11/20/2021 1632   CALCIUM 9.5 11/20/2021 1632   GFRNONAA >60 11/20/2021 1632   CrCl cannot be calculated (Patient's most recent lab result is older than the maximum 21 days allowed.).  COAG Lab Results  Component Value Date   INR 0.9 10/30/2020    Radiology CT VENOGRAM ABD/PEL  Result Date: 05/30/2023 CLINICAL DATA:  Left lower  extremity edema. Evaluate for May-Thurner syndrome. EXAM: CT ABDOMEN WITH CONTRAST TECHNIQUE: Multidetector CT imaging of the abdomen was performed using the standard protocol following bolus administration of intravenous contrast. RADIATION DOSE REDUCTION: This exam was performed according to the departmental dose-optimization program which includes automated exposure control, adjustment of the mA and/or kV according to patient size and/or use of iterative reconstruction technique. CONTRAST:  ISOVUE-370 IOPAMIDOL (ISOVUE-370) INJECTION 76% COMPARISON:  Bilateral lower extremity venous Doppler ultrasound-02/22/2023 (negative) Noncontrast CT scan abdomen pelvis-11/20/2021 FINDINGS: Lower chest: Limited visualization of the lower thorax demonstrates minimal dependent subpleural ground-glass atelectasis, right-greater-than-left. No discrete focal airspace opacities. No pleural effusion. Normal heart size.  No pericardial effusion. Hepatobiliary: There is diffuse decreased attenuation of the hepatic parenchyma suggestive of hepatic steatosis. There is a subcentimeter hypoattenuating lesion within the dome of the caudate (image 16, series 3), too small to  accurately characterize though favored to represent a hepatic cyst. Normal appearance of the gallbladder given degree distention. No radiopaque gallstones. No intra or extrahepatic biliary ductal dilatation. No ascites. Pancreas: Normal appearance of the pancreas. Spleen: Normal appearance of the spleen. Adrenals/Urinary Tract: There is symmetric enhancement and excretion of the bilateral kidneys. Subcentimeter hypoattenuating left-sided renal lesions are too small to accurately characterize though favored to represent renal cysts. No discrete right-sided renal lesions. No evidence of nephrolithiasis on this postcontrast examination. No urinary obstruction or perinephric stranding. Normal appearance of the bilateral adrenal glands. Normal appearance of the urinary bladder given degree of distention. Stomach/Bowel: Moderate colonic stool burden without evidence of enteric obstruction. Normal appearance of the terminal ileum and the appendix. No pneumoperitoneum, pneumatosis or portal venous gas. Vascular/Lymphatic: Normal caliber of the abdominal aorta. The major branch vessels of the abdominal aorta appear patent on this non CTA examination. The IVC and pelvic venous systems appear widely patent. There is no significant mass effect of the right common iliac artery upon the left common iliac vein to suggest May-Thurner syndrome. Additionally, there are no significantly hypertrophied retroperitoneal, parallel final venous collaterals to suggest a hemodynamically significant narrowing. No bulky retroperitoneal, mesenteric, pelvic or inguinal lymphadenopathy. Reproductive Organs: Normal appearance of the pelvic organs for age. No discrete adnexal lesion. No free fluid in the pelvic cul-de-sac. Other: Tiny mesenteric fat containing periumbilical hernia. Musculoskeletal: No acute or aggressive osseous abnormalities. There is partial lumbarization of the S1 vertebral body. Mild multilevel lumbar spine DDD, worse at L5-S1  with disc space height loss, endplate irregularity and sclerosis. IMPRESSION: 1. No CT evidence of May-Thurner syndrome. 2. Suspected hepatic steatosis.  Correlation with LFTs is advised. Electronically Signed   By: Simonne Come M.D.   On: 05/30/2023 15:49     Assessment/Plan 1. Lymphedema Recommend:  No surgery or intervention at this point in time.  I have reviewed my discussion with the patient regarding venous insufficiency and why it causes symptoms. I have discussed with the patient the chronic skin changes that accompany venous insufficiency and the long term sequela such as ulceration. Patient will contnue wearing graduated compression stockings on a daily basis, as this has provided excellent control of his edema. The patient will put the stockings on first thing in the morning and removing them in the evening. The patient is reminded not to sleep in the stockings.  In addition, behavioral modification including elevation during the day will be initiated. Exercise is strongly encouraged.  Previous duplex ultrasound of the lower extremities shows normal deep system, no significant superficial reflux was identified.  Given the patient's good  control and lack of any problems regarding the venous insufficiency and lymphedema a lymph pump in not need at this time.    The patient will follow up with me PRN should anything change.  The patient voices agreement with this plan.  2. Acute deep vein thrombosis (DVT) of left upper extremity, unspecified vein (HCC) Recommend:   No surgery or intervention at this point in time.  IVC filter is not indicated at present.  The patient is off anticoagulation   Elevation was stressed, use of a recliner was discussed.  I have reviewed with the patient DVT and post phlebitic changes such as swelling and why it  causes symptoms such as pain.  I recommended to the patient to wear graduated compression stockings, beginning after three full days of  anticoagulation.  Graduated compression should be worn on a daily basis. The patient should wear compression beginning first thing in the morning and removing them in the evening. The patient is instructed specifically not to sleep in the stockings.  In addition, behavioral modification including elevation during the day and avoidance of prolonged dependency will be initiated.    The patient will continue anticoagulation for now as there have not been any problems or complications from anticoagulation therapy at this point.  The patient will follow-up with me with noninvasive studies as ordered.    Levora Dredge, MD  06/18/2023 1:35 PM

## 2023-06-22 ENCOUNTER — Ambulatory Visit (INDEPENDENT_AMBULATORY_CARE_PROVIDER_SITE_OTHER): Payer: 59 | Admitting: Psychiatry

## 2023-06-22 ENCOUNTER — Encounter: Payer: Self-pay | Admitting: Psychiatry

## 2023-06-22 ENCOUNTER — Other Ambulatory Visit (HOSPITAL_COMMUNITY): Payer: Self-pay

## 2023-06-22 DIAGNOSIS — F422 Mixed obsessional thoughts and acts: Secondary | ICD-10-CM

## 2023-06-22 DIAGNOSIS — F900 Attention-deficit hyperactivity disorder, predominantly inattentive type: Secondary | ICD-10-CM | POA: Diagnosis not present

## 2023-06-22 DIAGNOSIS — F4310 Post-traumatic stress disorder, unspecified: Secondary | ICD-10-CM | POA: Diagnosis not present

## 2023-06-22 DIAGNOSIS — F320 Major depressive disorder, single episode, mild: Secondary | ICD-10-CM | POA: Diagnosis not present

## 2023-06-22 DIAGNOSIS — F411 Generalized anxiety disorder: Secondary | ICD-10-CM | POA: Diagnosis not present

## 2023-06-22 MED ORDER — DIAZEPAM 2 MG PO TABS
ORAL_TABLET | ORAL | 1 refills | Status: AC
  Filled 2023-06-22 – 2023-09-12 (×2): qty 30, 15d supply, fill #0
  Filled 2023-11-28: qty 30, 15d supply, fill #1

## 2023-06-22 MED ORDER — METHYLPHENIDATE HCL 5 MG PO TABS
5.0000 mg | ORAL_TABLET | Freq: Three times a day (TID) | ORAL | 0 refills | Status: DC
Start: 2023-08-29 — End: 2023-09-12

## 2023-06-22 MED ORDER — METHYLPHENIDATE HCL 5 MG PO TABS
5.0000 mg | ORAL_TABLET | Freq: Three times a day (TID) | ORAL | 0 refills | Status: DC
Start: 2023-08-01 — End: 2023-09-12
  Filled 2023-08-10: qty 90, 30d supply, fill #0

## 2023-06-22 MED ORDER — ESCITALOPRAM OXALATE 20 MG PO TABS
20.0000 mg | ORAL_TABLET | Freq: Every day | ORAL | 1 refills | Status: DC
  Filled 2023-06-22: qty 90, 90d supply, fill #0
  Filled 2023-08-10: qty 30, 30d supply, fill #0
  Filled 2023-09-12: qty 30, 30d supply, fill #1
  Filled 2023-10-17: qty 30, 30d supply, fill #2
  Filled 2023-11-21 (×2): qty 30, 30d supply, fill #3

## 2023-06-22 MED ORDER — METHYLPHENIDATE HCL 5 MG PO TABS
5.0000 mg | ORAL_TABLET | Freq: Three times a day (TID) | ORAL | 0 refills | Status: DC
Start: 2023-07-04 — End: 2023-11-23
  Filled 2023-09-21 – 2023-10-17 (×2): qty 90, 30d supply, fill #0

## 2023-06-22 NOTE — Progress Notes (Signed)
Shannon Swanson [id number] [birth year] 52 y.o.  Subjective:   Patient ID:  Shannon Swanson is a 52 y.o. (DOB [birth year]) female.  Chief Complaint:  Chief Complaint  Patient presents with   Anxiety    HPI Shannon Swanson presents to the office today for follow-up of depression, anxiety, and ADHD. She reports some health related anxiety. She has had a recent CT scan and lab work. She reports that she is trying to determine cause of physical symptoms. She reports that she had recent upper respiratory symptoms. She reports some rumination and catastrophic thoughts related to health.   She reports that she has been more tearful for the last few months and "super emotional." She reports some sadness. Energy and motivation have been low, and questions if this is related to medical issues. She reports that since the time change she has not wanted to leave home and do as many things. She has some anxiety in anticipation of leaving home. She reports poor sleep. Appetite varies. She reports that she has not been eating as much overall. She reports that her focus and concentration are consistent with baseline. Denies SI.    Ritalin last filled 06/06/23.  Diazepam last filled 03/23/23.    Past Psychiatric Medication Trials: Lexapro Wellbutrin SR-side effects Wellbutrin XL-side effects Diazepam- Took x 1. Ritalin Methylphenidate ER  Flowsheet Row ED from 06/10/2023 in [facility] Admission (Discharged) from 12/01/2021 in [facility] ED from 11/20/2021 in [facility] Emergency Department at [facility]  C-SSRS RISK CATEGORY No Risk No Risk No Risk        Review of Systems:  Review of Systems  Musculoskeletal:  Positive for back pain and neck pain. Negative for gait problem.       Pain in  legs and "saddle pain."   Neurological:  Negative for tremors.  Psychiatric/Behavioral:         Please refer to HPI    Medications: I have reviewed the patient's  current medications.  Current Outpatient Medications  Medication Sig Dispense Refill   meloxicam (MOBIC) 7.5 MG tablet Take 1 tablet (7.5 mg total) by mouth 2 (two) times daily. 60 tablet 0   [START ON 08/29/2023] methylphenidate (RITALIN) 5 MG tablet Take 1 tablet (5 mg total) by mouth 3 (three) times daily with meals. 90 tablet 0   tiZANidine (ZANAFLEX) 4 MG tablet Take 1 tablet (4 mg total) by mouth 3 (three) times daily as needed. 30 tablet 0   traMADol (ULTRAM) 50 MG tablet Take 1 tablet by mouth every 6 hours as needed 40 tablet 0   acetaminophen (TYLENOL) 325 MG tablet Take 650 mg by mouth every 6 (six) hours as needed.     [START ON 07/20/2023] diazepam (VALIUM) 2 MG tablet Take 1/2 - 1 tablet by mouth twice daily as needed for panic or PTSD symptoms 30 tablet 1   escitalopram (LEXAPRO) 20 MG tablet Take 1 tablet (20 mg total) by mouth daily. 90 tablet 1   ezetimibe-simvastatin (VYTORIN) 10-20 MG tablet Take 1 tablet by mouth daily at 6 PM. (Patient not taking: Reported on 06/22/2023) 30 tablet 2   fluticasone (FLONASE) 50 MCG/ACT nasal spray Place 1 spray into both nostrils daily. 16 g 0   guaiFENesin (ROBITUSSIN) 100 MG/5ML liquid Take 5-10 mLs (100-200 mg total) by mouth every 4 (four) hours as needed for cough or to loosen phlegm. 60 mL 0   ibuprofen (ADVIL) 200 MG tablet Take 200 mg by  mouth every 6 (six) hours as needed.     ketorolac (TORADOL) 10 MG tablet Take 1 tablet (10 mg total) by mouth every 6 (six) hours as needed. (Patient not taking: Reported on 04/27/2023) 5 tablet 0   lactase (LACTAID) 3000 UNITS tablet Take 9,000 Units by mouth 3 (three) times daily with meals.     [START ON 07/04/2023] methylphenidate (RITALIN) 5 MG tablet Take 1 tablet (5 mg total) by mouth 3 (three) times daily. 90 tablet 0   [START ON 08/01/2023] methylphenidate (RITALIN) 5 MG tablet Take 1 tablet (5 mg total) by mouth 3 times daily. 90 tablet 0   ondansetron (ZOFRAN-ODT) 4 MG disintegrating tablet Take  1 tablet (4 mg total) by mouth every 8 (eight) hours as needed for nausea or vomiting. 20 tablet 0   tiZANidine (ZANAFLEX) 4 MG tablet Take 1 tablet (4 mg total) by mouth 3 (three) times daily as needed. 30 tablet 0   traMADol (ULTRAM) 50 MG tablet Take 1 tablet (50 mg total) by mouth every 6 (six) hours as needed. 40 tablet 0   No current facility-administered medications for this visit.    Medication Side Effects: None  Allergies:  Allergies  Allergen Reactions   Meperidine Hcl Other (See Comments)    Severe headache   Lactose Intolerance (Gi)     Upset stomach    Codeine Nausea Only    TAKES ZOFRAN WITH FOR NAUSEA    Past Medical History:  Diagnosis Date   Anxiety    Attention deficit disorder (ADD)    Blood clot in vein 08/2020   after 08-2020 surgery post op   Complication of anesthesia 08/2020   limited neck mobility since cervical fusion, sleeps up on 2 pillows, right shoulder rotator cuff did noty heal properly after surgery   Depression    Dysrhythmia 2022   PACs- after epidural steroid injection in neck   Family history of adverse reaction to anesthesia    Mother, PONV and "difficult to wake up"   History of COVID-19    2021 or 2022 all symptoms resolved   History of kidney stones    MVP (mitral valve prolapse)    diagnosed at 16. Per notes from Dr. Oliver Barre in 2011, "no 2D echo evidence"   Nerve pain    per patient in bilateral hands fromn neck injury   PONV (postoperative nausea and vomiting)     Past Medical History, Surgical history, Social history, and Family history were reviewed and updated as appropriate.   Please see review of systems for further details on the patient's review from today.   Objective:   Physical Exam:  BP 121/77   Pulse 99   LMP  (LMP Unknown)   Physical Exam Constitutional:      General: She is not in acute distress. Musculoskeletal:        General: No deformity.  Neurological:     Mental Status: She is alert and  oriented to person, place, and time.     Coordination: Coordination normal.  Psychiatric:        Attention and Perception: Attention and perception normal. She does not perceive auditory or visual hallucinations.        Mood and Affect: Mood is anxious. Mood is not depressed. Affect is not labile, blunt, angry or inappropriate.        Speech: Speech normal.        Behavior: Behavior normal.        Thought Content:  Thought content normal. Thought content is not paranoid or delusional. Thought content does not include homicidal or suicidal ideation. Thought content does not include homicidal or suicidal plan.        Cognition and Memory: Cognition and memory normal.        Judgment: Judgment normal.     Comments: Insight intact     Lab Review:     Component Value Date/Time   NA 140 11/20/2021 1632   K 3.7 11/20/2021 1632   CL 106 11/20/2021 1632   CO2 26 11/20/2021 1632   GLUCOSE 130 (H) 11/20/2021 1632   BUN 12 11/20/2021 1632   CREATININE 0.99 11/20/2021 1632   CALCIUM 9.5 11/20/2021 1632   PROT 6.8 11/20/2021 1632   ALBUMIN 4.2 11/20/2021 1632   AST 24 11/20/2021 1632   ALT 26 11/20/2021 1632   ALKPHOS 77 11/20/2021 1632   BILITOT 0.6 11/20/2021 1632   GFRNONAA >60 11/20/2021 1632       Component Value Date/Time   WBC 19.3 (H) 11/20/2021 1632   RBC 4.48 11/20/2021 1632   HGB 13.8 11/20/2021 1632   HCT 40.9 11/20/2021 1632   PLT 301 11/20/2021 1632   MCV 91.3 11/20/2021 1632   MCH 30.8 11/20/2021 1632   MCHC 33.7 11/20/2021 1632   RDW 12.2 11/20/2021 1632   LYMPHSABS 1.7 10/30/2020 1116   MONOABS 0.4 10/30/2020 1116   EOSABS 0.1 10/30/2020 1116   BASOSABS 0.1 10/30/2020 1116    No results found for: "POCLITH", "LITHIUM"   No results found for: "PHENYTOIN", "PHENOBARB", "VALPROATE", "CBMZ"   .res Assessment: Plan:    Pt reports that she prefers not to make any medication changes at this time since she has had significant adverse effects with past medication  trials.  Continue Lexapro 20 mg daily for depression and anxiety.  Continue Diazepam 2 mg 1/2-1 tab po BID prn anxiety.  Continue Ritalin 5 mg three times daily for ADHD.  Recommend continuing therapy with Stevphen Meuse, [facility].  Pt to follow-up in 3 months or sooner if clinically indicated.  Patient advised to contact office with any questions, adverse effects, or acute worsening in signs and symptoms.   Verlee was seen today for anxiety.  Diagnoses and all orders for this visit:  Generalized anxiety disorder -     diazepam (VALIUM) 2 MG tablet; Take 1/2 - 1 tablet by mouth twice daily as needed for panic or PTSD symptoms -     escitalopram (LEXAPRO) 20 MG tablet; Take 1 tablet (20 mg total) by mouth daily.  Mixed obsessional thoughts and acts -     diazepam (VALIUM) 2 MG tablet; Take 1/2 - 1 tablet by mouth twice daily as needed for panic or PTSD symptoms -     escitalopram (LEXAPRO) 20 MG tablet; Take 1 tablet (20 mg total) by mouth daily.  Post traumatic stress disorder (PTSD) -     escitalopram (LEXAPRO) 20 MG tablet; Take 1 tablet (20 mg total) by mouth daily.  Current mild episode of major depressive disorder without prior episode (HCC) -     escitalopram (LEXAPRO) 20 MG tablet; Take 1 tablet (20 mg total) by mouth daily.  ADHD, predominantly inattentive type -     methylphenidate (RITALIN) 5 MG tablet; Take 1 tablet (5 mg total) by mouth 3 (three) times daily. -     methylphenidate (RITALIN) 5 MG tablet; Take 1 tablet (5 mg total) by mouth 3 times daily. -     methylphenidate (RITALIN)  5 MG tablet; Take 1 tablet (5 mg total) by mouth 3 (three) times daily with meals.     Please see After Visit Summary for patient specific instructions.  Future Appointments  Date Time Provider Department Center  07/20/2023 10:00 AM Stevphen Meuse, [facility] CP-CP None  08/17/2023 10:00 AM Stevphen Meuse, [facility] CP-CP None  09/21/2023 10:00 AM Stevphen Meuse, [facility] CP-CP None    No orders of the  defined types were placed in this encounter.   -------------------------------

## 2023-06-27 DIAGNOSIS — M542 Cervicalgia: Secondary | ICD-10-CM | POA: Diagnosis not present

## 2023-06-27 DIAGNOSIS — Z6827 Body mass index (BMI) 27.0-27.9, adult: Secondary | ICD-10-CM | POA: Diagnosis not present

## 2023-06-27 DIAGNOSIS — M62838 Other muscle spasm: Secondary | ICD-10-CM | POA: Diagnosis not present

## 2023-07-04 ENCOUNTER — Other Ambulatory Visit (HOSPITAL_COMMUNITY): Payer: Self-pay

## 2023-07-13 ENCOUNTER — Telehealth: Payer: Self-pay

## 2023-07-13 DIAGNOSIS — R748 Abnormal levels of other serum enzymes: Secondary | ICD-10-CM | POA: Diagnosis not present

## 2023-07-13 DIAGNOSIS — R059 Cough, unspecified: Secondary | ICD-10-CM | POA: Diagnosis not present

## 2023-07-13 DIAGNOSIS — R7982 Elevated C-reactive protein (CRP): Secondary | ICD-10-CM | POA: Diagnosis not present

## 2023-07-13 DIAGNOSIS — R053 Chronic cough: Secondary | ICD-10-CM | POA: Diagnosis not present

## 2023-07-13 NOTE — Telephone Encounter (Signed)
 [facility] called to follow up on a referral that was placed 12/10. States it was uploaded to proficient. They will fax notes to front office.   Call back: [phone] ext. [phone]  Sandie Ano, RN

## 2023-07-14 ENCOUNTER — Other Ambulatory Visit (HOSPITAL_COMMUNITY): Payer: Self-pay

## 2023-07-20 ENCOUNTER — Ambulatory Visit (INDEPENDENT_AMBULATORY_CARE_PROVIDER_SITE_OTHER): Payer: 59 | Admitting: Psychiatry

## 2023-07-20 DIAGNOSIS — F4310 Post-traumatic stress disorder, unspecified: Secondary | ICD-10-CM
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
## 2023-07-24 ENCOUNTER — Ambulatory Visit: Payer: 59 | Admitting: Internal Medicine

## 2023-07-24 ENCOUNTER — Encounter: Payer: Self-pay | Admitting: Internal Medicine

## 2023-07-24 ENCOUNTER — Other Ambulatory Visit: Payer: Self-pay

## 2023-07-24 VITALS — BP 137/86 | HR 84 | Resp 16 | Ht 68.0 in | Wt 178.0 lb

## 2023-07-24 DIAGNOSIS — M2550 Pain in unspecified joint: Secondary | ICD-10-CM

## 2023-07-24 DIAGNOSIS — M791 Myalgia, unspecified site: Secondary | ICD-10-CM

## 2023-07-24 DIAGNOSIS — R609 Edema, unspecified: Secondary | ICD-10-CM | POA: Diagnosis not present

## 2023-07-24 NOTE — Patient Instructions (Signed)
 Will place referral to rheumatology for evaluation for myaglia/arthralgia  I have placed dengue and chikungunya testing along with blood smear for potential viral and parasite infection  If tests are positive we can discuss what else to do. So no need to schedule any follow up right now   I would probably also advise you to discuss with primary care regarding neurology evaluation if rheumatology hits a dead end given muscle stiffness/cpk elevation/muscle pain

## 2023-07-24 NOTE — Progress Notes (Signed)
 [facility]  Reason for Consult:patient with concern for travel related infection Referring Provider: Charlene Single, PCP    Patient Active Problem List   Diagnosis Date Noted   Lymphedema 04/26/2023   DVT (deep venous thrombosis) (HCC) 09/17/2020   Spinal stenosis of cervical region 09/07/2020   Post traumatic stress disorder (PTSD) 06/16/2020   Mixed obsessional thoughts and acts 04/29/2018   ADHD, predominantly inattentive type 04/29/2018   Narcoleptic syndrome 04/29/2018   Dyspnea 08/28/2015   Chest pain, atypical 08/28/2015   B12 deficiency 11/26/2014   Contusion of leg, right 08/21/2014   Low back pain 06/19/2014   Generalized anxiety disorder 06/19/2014   Routine health maintenance 10/07/2013   PALPITATIONS, OCCASIONAL 10/02/2008   Mitral valve disorder 06/01/2007      HPI: Shannon Swanson is a 53 y.o. female with travel here for concern of travel related infection  Sx: Patient reports legs stiffness after prolonged rest associated which gets better after a few minutes of activities. She feels this interferes with balance especially at night. She reported associated joint pain ankles/knees; no swelling; warmth. She also reports hip/shoulder pain as well  No fever, chill, skin rash, leg swelling  She did have right shoulder rotator cuff surgery and fusion surgery for cervical spine. Intermittent neck pain with headache and nausea  She has been getting physical therapy for neck/shoulder. While getting physical therapy she does notice legs pain  to the feet bilaterally  Prior travel to [place] 02/07/22-->02/21/2022 medical clinic. She took malaria prophylaxis at that time.  [place] after [place], she developed left > right leg swelling after a flight this year and r/o dvt -- seeing vascular surgery. Has some kind of abd pelv imaging that might show a hepatic/renal cyst  She recently started statin and the  pain in her legs/thighs she thinks is worse.   She has a couple tick bites in the recent past   Pcp testing lyme screen negative Cpk was 381 - statin was stopped --> repeat ck improved   No weight loss/decreased appetite/fever-chill/night sweat  Currently no further leg swelling    Review of Systems: ROS All other ros negative      Past Medical History:  Diagnosis Date   Anxiety    Attention deficit disorder (ADD)    Blood clot in vein 08/2020   after 08-2020 surgery post op   Complication of anesthesia 08/2020   limited neck mobility since cervical fusion, sleeps up on 2 pillows, right shoulder rotator cuff did noty heal properly after surgery   Depression    Dysrhythmia 2022   PACs- after epidural steroid injection in neck   Family history of adverse reaction to anesthesia    Mother, PONV and difficult to wake up   History of COVID-19    2021 or 2022 all symptoms resolved   History of kidney stones    MVP (mitral valve prolapse)    diagnosed at 16. Per notes from Dr. Lynwood Rush in 2011, no 2D echo evidence   Nerve pain    per patient in bilateral hands fromn neck injury   PONV (postoperative nausea and vomiting)     Social History   Tobacco Use   Smoking status: Never   Smokeless tobacco: Never  Vaping Use   Vaping status: Never Used  Substance Use Topics   Alcohol use: No   Drug use: No    Family  History  Problem Relation Age of Onset   Diabetes Mother    Mitral valve prolapse Mother    Hiatal hernia Mother    Emphysema Father    Hyperlipidemia Sister    Diabetes Sister    Pulmonary embolism Sister    Diabetes Maternal Aunt    Diabetes Maternal Uncle     Allergies  Allergen Reactions   Meperidine Hcl Other (See Comments)    Severe headache   Lactose Intolerance (Gi)     Upset stomach    Codeine Nausea Only    TAKES ZOFRAN  WITH FOR NAUSEA    OBJECTIVE: Vitals:   07/24/23 1510  BP: 137/86  Pulse: 84  Resp: 16  SpO2: 94%   Weight: 178 lb (80.7 kg)  Height: 5' 8 (1.727 m)   Body mass index is 27.06 kg/m.   Physical Exam General/constitutional: no distress, pleasant HEENT: Normocephalic, PER, Conj Clear, EOMI, Oropharynx clear Neck supple CV: rrr no mrg Lungs: clear to auscultation, normal respiratory effort Abd: Soft, Nontender Ext: trace bilateral lower extremities edema Skin: No Rash Neuro: nonfocal MSK: no peripheral joint swelling/tenderness/warmth; back spines nontender  Lab: Pcp's labs 06/13/23 Rf <10 Lyme screen negative Ccp ab negative ANA negative Sed rate 37 normal Crp 16 (<10) Anti-ds dna<1 Lft 23/28/115  Aldolase 4.8  Microbiology:  Serology:  Imaging:   Assessment/plan: Problem List Items Addressed This Visit   None Visit Diagnoses       Myalgia    -  Primary   Relevant Orders   Dengue Fever Abs, IgG, IgM   Chikungunya Abs w/Reflex to Titer   Malaria Smear   Parasite Exam, Blood   CBC w/Diff   COMPLETE METABOLIC PANEL WITH GFR   C-reactive protein   Sedimentation rate     Arthralgia, unspecified joint       Relevant Orders   Dengue Fever Abs, IgG, IgM   Chikungunya Abs w/Reflex to Titer   Malaria Smear   Parasite Exam, Blood     Asymmetric edema of both lower extremities       Relevant Orders   Parasite Exam, Blood         Arthralgia/myalgia main symtomatology While some viral infection can have chronic arthralgia, at this time I do not hear a chronic febrile disease or any symptoms of migrating visceral larva migran  Dengue and chikungnya testing to detect prior exposure seems reasonable especially in terms of future follow up where rheumatology of neurology (cpk elevation/stiffness) might be needed  There is case report of acute exposure and filarial disease but I doubt this is the case. A blood smear though is reasonable to look for parasites  I would advise follow up with rheumatology and neurology given the pronounced arthralgia and myalgia  process and very low suspicion for chronic infectious disease process at this time       Follow-up: No follow-ups on file.  [name], MD [facility] 07/24/2023, 3:24 PM

## 2023-07-26 ENCOUNTER — Other Ambulatory Visit: Payer: 59

## 2023-07-26 NOTE — Addendum Note (Signed)
 Addended by: GRETEL TULLY HERO on: 07/26/2023 01:36 PM   Modules accepted: Orders

## 2023-08-10 ENCOUNTER — Other Ambulatory Visit: Payer: Self-pay

## 2023-08-10 ENCOUNTER — Other Ambulatory Visit (HOSPITAL_COMMUNITY): Payer: Self-pay

## 2023-08-10 MED ORDER — MELOXICAM 7.5 MG PO TABS
7.5000 mg | ORAL_TABLET | Freq: Two times a day (BID) | ORAL | 0 refills | Status: DC
  Filled 2023-08-10: qty 60, 30d supply, fill #0

## 2023-08-10 MED ORDER — TIZANIDINE HCL 4 MG PO TABS
4.0000 mg | ORAL_TABLET | Freq: Three times a day (TID) | ORAL | 0 refills | Status: DC | PRN
  Filled 2023-08-10: qty 30, 10d supply, fill #0

## 2023-08-10 MED ORDER — TRAMADOL HCL 50 MG PO TABS
50.0000 mg | ORAL_TABLET | Freq: Four times a day (QID) | ORAL | 0 refills | Status: DC | PRN
  Filled 2023-08-10: qty 40, 10d supply, fill #0

## 2023-08-11 ENCOUNTER — Other Ambulatory Visit: Payer: Self-pay

## 2023-08-16 ENCOUNTER — Other Ambulatory Visit: Payer: 59

## 2023-08-16 ENCOUNTER — Other Ambulatory Visit: Payer: Self-pay

## 2023-08-16 DIAGNOSIS — M2550 Pain in unspecified joint: Secondary | ICD-10-CM | POA: Diagnosis not present

## 2023-08-16 DIAGNOSIS — M791 Myalgia, unspecified site: Secondary | ICD-10-CM

## 2023-08-17 ENCOUNTER — Ambulatory Visit: Payer: 59 | Admitting: Psychiatry

## 2023-08-18 LAB — MALARIA SMEAR

## 2023-08-22 LAB — CBC WITH DIFFERENTIAL/PLATELET
Absolute Lymphocytes: 1740 {cells}/uL (ref 850–3900)
Absolute Monocytes: 366 {cells}/uL (ref 200–950)
Basophils Absolute: 42 {cells}/uL (ref 0–200)
Basophils Relative: 0.7 %
Eosinophils Absolute: 120 {cells}/uL (ref 15–500)
Eosinophils Relative: 2 %
HCT: 42.6 % (ref 35.0–45.0)
Hemoglobin: 14.4 g/dL (ref 11.7–15.5)
MCH: 30.4 pg (ref 27.0–33.0)
MCHC: 33.8 g/dL (ref 32.0–36.0)
MCV: 89.9 fL (ref 80.0–100.0)
MPV: 10 fL (ref 7.5–12.5)
Monocytes Relative: 6.1 %
Neutro Abs: 3732 {cells}/uL (ref 1500–7800)
Neutrophils Relative %: 62.2 %
Platelets: 350 10*3/uL (ref 140–400)
RBC: 4.74 10*6/uL (ref 3.80–5.10)
RDW: 12 % (ref 11.0–15.0)
Total Lymphocyte: 29 %
WBC: 6 10*3/uL (ref 3.8–10.8)

## 2023-08-22 LAB — COMPLETE METABOLIC PANEL WITH GFR
AG Ratio: 1.9 (calc) (ref 1.0–2.5)
ALT: 23 U/L (ref 6–29)
AST: 21 U/L (ref 10–35)
Albumin: 4.7 g/dL (ref 3.6–5.1)
Alkaline phosphatase (APISO): 99 U/L (ref 37–153)
BUN: 13 mg/dL (ref 7–25)
CO2: 26 mmol/L (ref 20–32)
Calcium: 9.8 mg/dL (ref 8.6–10.4)
Chloride: 104 mmol/L (ref 98–110)
Creat: 0.9 mg/dL (ref 0.50–1.03)
Globulin: 2.5 g/dL (ref 1.9–3.7)
Glucose, Bld: 96 mg/dL (ref 65–99)
Potassium: 4.4 mmol/L (ref 3.5–5.3)
Sodium: 139 mmol/L (ref 135–146)
Total Bilirubin: 0.7 mg/dL (ref 0.2–1.2)
Total Protein: 7.2 g/dL (ref 6.1–8.1)
eGFR: 76 mL/min/{1.73_m2} (ref >=60)

## 2023-08-22 LAB — DENGUE FEVER ABS, IGG, IGM
Dengue Fever Ab (IgG): 0.3
Dengue Fever Ab (IgM): 1.17

## 2023-08-22 LAB — SEDIMENTATION RATE: Sed Rate: 6 mm/h (ref 0–30)

## 2023-08-22 LAB — C-REACTIVE PROTEIN: CRP: <3 mg/L (ref <8.0)

## 2023-08-22 LAB — CHIKUNGUNYA ABS W/REFLEX TO TITER
Chikungunya IgG Screen: NEGATIVE
Chikungunya IgM Screen: NEGATIVE

## 2023-08-23 ENCOUNTER — Encounter: Payer: Self-pay | Admitting: Internal Medicine

## 2023-09-12 ENCOUNTER — Other Ambulatory Visit: Payer: Self-pay | Admitting: Student

## 2023-09-12 ENCOUNTER — Ambulatory Visit: Attending: Cardiology | Admitting: Cardiology

## 2023-09-12 ENCOUNTER — Other Ambulatory Visit (HOSPITAL_COMMUNITY): Payer: Self-pay

## 2023-09-12 ENCOUNTER — Encounter: Payer: Self-pay | Admitting: Cardiology

## 2023-09-12 VITALS — BP 112/78 | HR 88 | Resp 16 | Ht 68.0 in | Wt 176.0 lb

## 2023-09-12 DIAGNOSIS — I491 Atrial premature depolarization: Secondary | ICD-10-CM

## 2023-09-12 DIAGNOSIS — I499 Cardiac arrhythmia, unspecified: Secondary | ICD-10-CM | POA: Diagnosis not present

## 2023-09-12 DIAGNOSIS — M533 Sacrococcygeal disorders, not elsewhere classified: Secondary | ICD-10-CM | POA: Diagnosis not present

## 2023-09-12 DIAGNOSIS — M542 Cervicalgia: Secondary | ICD-10-CM

## 2023-09-12 DIAGNOSIS — Z6826 Body mass index (BMI) 26.0-26.9, adult: Secondary | ICD-10-CM | POA: Diagnosis not present

## 2023-09-12 MED ORDER — PROPRANOLOL HCL 20 MG PO TABS
20.0000 mg | ORAL_TABLET | Freq: Three times a day (TID) | ORAL | 2 refills | Status: DC | PRN
  Filled 2023-09-12: qty 90, 30d supply, fill #0

## 2023-09-12 MED ORDER — TRAMADOL HCL 50 MG PO TABS
50.0000 mg | ORAL_TABLET | Freq: Four times a day (QID) | ORAL | 0 refills | Status: DC | PRN
  Filled 2023-09-12: qty 40, 10d supply, fill #0

## 2023-09-12 MED ORDER — MELOXICAM 7.5 MG PO TABS
7.5000 mg | ORAL_TABLET | Freq: Two times a day (BID) | ORAL | 0 refills | Status: DC
  Filled 2023-09-12: qty 60, 30d supply, fill #0

## 2023-09-12 MED ORDER — TIZANIDINE HCL 4 MG PO TABS
4.0000 mg | ORAL_TABLET | Freq: Three times a day (TID) | ORAL | 0 refills | Status: DC
  Filled 2023-09-12: qty 30, 10d supply, fill #0

## 2023-09-12 NOTE — Progress Notes (Signed)
 Cardiology Office Note:  .   Date:  09/12/2023  ID:  Shannon Swanson, DOB [birth year], MRN [id number] PCP: Ailene Ravel, MD  [facility] Providers Cardiologist:  None   History of Present Illness: .   Shannon Swanson is a 53 y.o. complex female patient with anxiety, ADD, work-related traumatic injury leading to multiple surgeries and of neck, told to have narcolepsy/complex sleep apnea in the past and was recommended CPAP, referred to me for evaluation of palpitations.  She has hyperglycemia, hypercholesterolemia.  Most episodes of palpitation occurring during rest and routine activity then with exertion activity lasting a few minutes.  No other associated symptoms.  Discussed the use of AI scribe software for clinical note transcription with the patient, who gave verbal consent to proceed.  History of Present Illness   The patient, with a past medical history of neck surgery, leg swelling, and high cholesterol, presents with a chief complaint of persistent irregular heartbeats. The patient describes these palpitations as being more noticeable when at rest or lying down, particularly on her left side. The patient denies any associated chest pain or shortness of breath. The patient also reports a history of neck surgery and leg swelling, which has been managed with compression socks.   The patient has a history of high cholesterol, previously managed with Vytorin 10/40, but this was discontinued due to associated leg pain and elevated CK enzymes. The patient is currently taking metoprolol for the irregular heartbeats and reports some improvement, but the palpitations have not completely resolved. The patient is planning a mission trip to [place] and expresses concern about managing her health while abroad.      Labs    Lab Results  Component Value Date   NA 139 08/16/2023   K 4.4 08/16/2023   CO2 26 08/16/2023   GLUCOSE 96 08/16/2023   BUN 13 08/16/2023   CREATININE 0.90  08/16/2023   CALCIUM 9.8 08/16/2023   GFR 74.57 11/25/2014   EGFR 76 08/16/2023   GFRNONAA >60 11/20/2021      Latest Ref Rng & Units 08/16/2023   11:44 AM 11/20/2021    4:32 PM 10/30/2020   11:36 AM  BMP  Glucose 65 - 99 mg/dL 96  284  132   BUN 7 - 25 mg/dL 13  12  9    Creatinine 0.50 - 1.03 mg/dL 4.40  1.02  7.25   BUN/Creat Ratio 6 - 22 (calc) SEE NOTE:     Sodium 135 - 146 mmol/L 139  140  141   Potassium 3.5 - 5.3 mmol/L 4.4  3.7  3.9   Chloride 98 - 110 mmol/L 104  106  104   CO2 20 - 32 mmol/L 26  26    Calcium 8.6 - 10.4 mg/dL 9.8  9.5        Latest Ref Rng & Units 08/16/2023   11:44 AM 11/20/2021    4:32 PM 10/30/2020   11:36 AM  CBC  WBC 3.8 - 10.8 Thousand/uL 6.0  19.3    Hemoglobin 11.7 - 15.5 g/dL 36.6  44.0  34.7   Hematocrit 35.0 - 45.0 % 42.6  40.9  44.0   Platelets 140 - 400 Thousand/uL 350  301      Lab Results  Component Value Date   TSH 1.28 11/25/2014    External Labs:  Labs 02/21/2023:   TSH normal at 2.470.  Free T4 normal at 1.03.   Cholesterol, total 222.000 M 01/25/2023 HDL 48.000  MG 01/25/2023 LDL 182.000 m 12/28/2020 Triglycerides 83.000 MG 01/25/2023   A1C 6.000 % 01/25/2023  Review of Systems  Cardiovascular:  Positive for palpitations. Negative for chest pain, dyspnea on exertion and leg swelling.   Physical Exam:   VS:  BP 112/78 (BP Location: Left Arm, Patient Position: Sitting, Cuff Size: Normal)   Pulse 88   Resp 16   Ht 5\' 8"  (1.727 m)   Wt 176 lb (79.8 kg)   LMP  (LMP Unknown)   SpO2 96%   BMI 26.76 kg/m    Wt Readings from Last 3 Encounters:  09/12/23 176 lb (79.8 kg)  07/24/23 178 lb (80.7 kg)  06/15/23 177 lb 12.8 oz (80.6 kg)    Physical Exam Neck:     Vascular: No carotid bruit or JVD.  Cardiovascular:     Rate and Rhythm: Normal rate and regular rhythm.     Pulses: Intact distal pulses.     Heart sounds: Normal heart sounds. No murmur heard.    No gallop.  Pulmonary:     Effort: Pulmonary effort is normal.      Breath sounds: Normal breath sounds.  Abdominal:     General: Bowel sounds are normal.     Palpations: Abdomen is soft.  Musculoskeletal:     Right lower leg: No edema.     Left lower leg: No edema.    Studies Reviewed: Marland Kitchen    Event monitor weeks 05/20/2020: Predominant rhythm is normal sinus rhythm.  Patient symptoms correlated with normal sinus rhythm with artifact and lead loss.  No other significant arrhythmias were noted. Minimum heart rate 56 bpm at 4:45 AM and maximum heart rate of 149 bpm at 10:26 AM. Symptomatic transmissions revealed brief atrial tachycardia episodes and artifact.  Longest run 6 beats.   Echocardiogram 03/15/2023: 1. Left ventricle cavity is normal in size. Normal left ventricular wall thickness. Normal global wall motion. Normal LV systolic function with EF 70%. Normal diastolic filling pattern. 2. No significant valvular abnormality. 3. Normal right atrial pressure. 4. No significant change compared to previous study in 2017. EKG:    EKG Interpretation Date/Time:  Tuesday September 12 2023 11:15:56 EST Ventricular Rate:  79 PR Interval:  142 QRS Duration:  82 QT Interval:  376 QTC Calculation: 431 R Axis:   9  Text Interpretation: EKG 09/12/2023: Normal sinus rhythm at the rate of 79 bpm, normal axis, IRBBB.  Single PAC. Confirmed by Delrae Rend [phone]) on 09/12/2023 5:54:33 PM    EKG 03/01/2023: Normal sinus rhythm at rate of 88 bpm, left atrial enlargement, normal axis. Incomplete right bundle branch block.   Medications and allergies    Allergies  Allergen Reactions   Meperidine Hcl Other (See Comments)    Severe headache   Lactose Intolerance (Gi)     Upset stomach    Codeine Nausea Only    TAKES ZOFRAN WITH FOR NAUSEA     Current Outpatient Medications:    acetaminophen (TYLENOL) 325 MG tablet, Take 650 mg by mouth every 6 (six) hours as needed., Disp: , Rfl:    diazepam (VALIUM) 2 MG tablet, Take 1/2 - 1 tablet by mouth twice daily as  needed for panic or PTSD symptoms, Disp: 30 tablet, Rfl: 1   escitalopram (LEXAPRO) 20 MG tablet, Take 1 tablet (20 mg total) by mouth daily., Disp: 90 tablet, Rfl: 1   fluticasone (FLONASE) 50 MCG/ACT nasal spray, Place 1 spray into both nostrils daily., Disp: 16 g, Rfl: 0  ibuprofen (ADVIL) 200 MG tablet, Take 200 mg by mouth every 6 (six) hours as needed., Disp: , Rfl:    ketorolac (TORADOL) 10 MG tablet, Take 1 tablet (10 mg total) by mouth every 6 (six) hours as needed., Disp: 5 tablet, Rfl: 0   lactase (LACTAID) 3000 UNITS tablet, Take 9,000 Units by mouth 3 (three) times daily with meals., Disp: , Rfl:    meloxicam (MOBIC) 7.5 MG tablet, Take 1 tablet (7.5 mg total) by mouth 2 (two) times daily., Disp: 60 tablet, Rfl: 0   methylphenidate (RITALIN) 5 MG tablet, Take 1 tablet (5 mg total) by mouth 3 (three) times daily., Disp: 90 tablet, Rfl: 0   metoprolol tartrate (LOPRESSOR) 25 MG tablet, Take 25 mg by mouth daily., Disp: , Rfl:    propranolol (INDERAL) 20 MG tablet, Take 1 tablet (20 mg total) by mouth 3 (three) times daily as needed., Disp: 90 tablet, Rfl: 2   tiZANidine (ZANAFLEX) 4 MG tablet, Take 1 tablet (4 mg total) by mouth 3 (three) times daily., Disp: 30 tablet, Rfl: 0   albuterol (VENTOLIN HFA) 108 (90 Base) MCG/ACT inhaler, Inhale 1-2 puffs into the lungs every 4 (four) hours as needed for wheezing or shortness of breath. (Patient not taking: Reported on 09/12/2023), Disp: , Rfl:    ondansetron (ZOFRAN-ODT) 4 MG disintegrating tablet, Take 1 tablet (4 mg total) by mouth every 8 (eight) hours as needed for nausea or vomiting. (Patient not taking: Reported on 09/12/2023), Disp: 20 tablet, Rfl: 0   traMADol (ULTRAM) 50 MG tablet, Take 1 tablet (50 mg total) by mouth every 6 (six) hours as needed., Disp: 40 tablet, Rfl: 0   ASSESSMENT AND PLAN: .      ICD-10-CM   1. Irregular heart rate  I49.9 EKG 12-Lead    propranolol (INDERAL) 20 MG tablet    2. PAC (premature atrial contraction)   I49.1       Assessment and Plan    Premature Atrial Complexes (PACs) / Premature Ventricular Complexes (PVCs) Persistent irregular heartbeats are more noticeable when lying down or still and occasionally cause dizziness. Symptoms are ongoing and concerning with an upcoming mission trip. A recent EKG showed irregularities. She started on metoprolol 25 mg but is taking half the dose due to hypotension concerns. PACs and PVCs are benign and often exacerbated by respiratory infections, lack of sleep, stress, hormonal changes, or caffeine. Exercise may help reduce symptoms by increasing heart rate and blocking extra beats, though symptoms may return post-exercise but remain benign. Continue metoprolol 12.5 mg as needed and prescribe propranolol for use as needed to see which 1 would benefit her more.  Encourage regular exercise and reassure about the benign nature of PACs/PVCs. Advise monitoring symptoms and using propranolol if needed during the mission trip.  Hypercholesterolemia Patient was started on Vytorin 10/40 mg daily on her last office visit with me in September 2024 however patient developed myopathy with elevated CK enzymes and bilateral inner thigh pain.  Advised her to challenge herself with 1/2 tablet daily as her LDL is greater than 160 mg and also she has hyperglycemia.  Her risk factors need to be appropriately addressed as we go along.  Some form of statin or nonstatin therapy for hypercholesterolemia may be indicated.  She may benefit from coronary calcium score.  Follow-up Schedule a follow-up appointment after returning from the mission trip. Advise contacting the doctor if any major issues arise during the trip.  Signed,  Yates Decamp, MD, [facility] 09/12/2023, 5:54 PM [facility] [address] [place], [place] [address] Phone: [phone]. Fax:  [phone]

## 2023-09-12 NOTE — Patient Instructions (Signed)
 Medication Instructions:  Your physician recommends that you continue on your current medications as directed. Please refer to the Current Medication list given to you today.  *If you need a refill on your cardiac medications before your next appointment, please call your pharmacy*  Follow-Up: At [facility], you and your health needs are our priority.  As part of our continuing mission to provide you with exceptional heart care, we have created designated Provider Care Teams.  These Care Teams include your primary Cardiologist (physician) and Advanced Practice Providers (APPs -  Physician Assistants and Nurse Practitioners) who all work together to provide you with the care you need, when you need it.  Your next appointment:   As needed  The format for your next appointment:   In Person  Provider:   Yates Decamp, MD {  Other Instructions   1st Floor: - Lobby - Registration  - Pharmacy  - Lab - Cafe  2nd Floor: - PV Lab - Diagnostic Testing (echo, CT, nuclear med)  3rd Floor: - Vacant  4th Floor: - TCTS (cardiothoracic surgery) - AFib Clinic - Structural Heart Clinic - Vascular Surgery  - Vascular Ultrasound  5th Floor: - HeartCare Cardiology (general and EP) - Clinical Pharmacy for coumadin, hypertension, lipid, weight-loss medications, and med management appointments    Valet parking services will be available as well.

## 2023-09-21 ENCOUNTER — Ambulatory Visit (INDEPENDENT_AMBULATORY_CARE_PROVIDER_SITE_OTHER): Payer: 59 | Admitting: Psychiatry

## 2023-09-21 ENCOUNTER — Other Ambulatory Visit (HOSPITAL_COMMUNITY): Payer: Self-pay

## 2023-09-21 DIAGNOSIS — F411 Generalized anxiety disorder: Secondary | ICD-10-CM | POA: Diagnosis not present

## 2023-09-21 NOTE — Progress Notes (Unsigned)
 [facility] Counselor/Therapist Progress Note  Patient ID: Shannon Swanson, MRN: [id number],    Date: 09/21/2023  Time Spent: 52 minutes start time 10:16 AM end time 11:08  Treatment Type: Individual Therapy  Reported Symptoms: anxiety, health issues, sadness, rumination, fatigue, crying spells, chronic pain, triggered responses, panic  Mental Status Exam:  Appearance:   Casual     Behavior:  Appropriate  Motor:  Normal  Speech/Language:   Normal Rate  Affect:  Appropriate tearful  Mood:  anxious sadness panic  Thought process:  normal  Thought content:    WNL  Sensory/Perceptual disturbances:    WNL  Orientation:  oriented to person, place, time/date, and situation  Attention:  Good  Concentration:  Good  Memory:  WNL  Fund of knowledge:   Good  Insight:    Good  Judgment:   Good  Impulse Control:  Good   Risk Assessment: Danger to Self:  No Self-injurious Behavior: No Danger to Others: No Duty to Warn:no Physical Aggression / Violence:No  Access to Firearms a concern: No  Gang Involvement:No   Subjective: Patient was present for session. She shared that she was struggling due to her anxiety and physical issues. She shared she has seen the cardiologist and her heart is out of rhythm and has started on a Beta blocker to help with the symptoms. She has started walking which is a good thing.  She shared she has had her 4th round of sickness and her cough has not gone away. She shared she has had family that have invited her to visit them in [place] and she is having lots of anxiety over the trip.Patient shared that she is fearful that maybe something would happen to her physically and she is trying to put some plans in place just in case.  Patient was encouraged to realize that she needs to go on this trip because at this point she is ending most of her time just at home and her thoughts are very negative which is not going to help her mood and move her in a better  direction.  Patient was encouraged to think through what she needs to tell herself and what she needed to do to feel capable of going on the trip to [place].  Patient was reminded of the facts/truth she was encouraged to stay focused on those as she prepares to go for her trip.  Patient was also encouraged to realize that she will probably have anxiety if nothing else because she is going into a more active place than she typically does with most people and the sensory is going to be overwhelming for her.  Discussed ways that she can take care of herself and move things in a better direction.  Patient shared she is also having her daughter and her grandchildren at her house.  Even though she is happy they are there the sensory is overwhelming and when she tried to hold her grandbaby she ended up having great pain which was very upsetting for her.  Patient was able to recognize she can hold the child if she is sitting down with lots of support and so she will do that.  She was encouraged to realize that even though things are difficult they can still be okay and she has got to think through how to make that happen.  Interventions: Cognitive Behavioral Therapy and Solution-Oriented/Positive Psychology  Diagnosis:   ICD-10-CM   1. Generalized anxiety disorder  F41.1  Plan:  Patient is to use CBT and coping skills to decrease triggered responses.  Patient is to work on following plans from session to be able to go on her trip to [place] with her relatives.  Patient is to work on journaling symptoms feelings and triggers.  Patient is to work on finding things that give her life purpose.  Patient is to work on breaking things up into small pieces that she feels like she can accomplish.  Patient is to work on thinking of positives and affirming herself.  Patient is to look at podcast by Dr. De Burrs on neuro cycle changes.  Patient is to work on reminding herself that she is enough and still has a purpose  even when she is unable to work.  She is to try and focus on concrete things that she can still do.  Patient is to continue working with providers on her medical issues.  Patient is to take medication as directed   Stevphen Meuse, [facility]

## 2023-09-22 ENCOUNTER — Other Ambulatory Visit (HOSPITAL_COMMUNITY): Payer: Self-pay

## 2023-09-23 ENCOUNTER — Other Ambulatory Visit (HOSPITAL_COMMUNITY): Payer: Self-pay

## 2023-09-24 ENCOUNTER — Telehealth: Payer: Self-pay | Admitting: Physician Assistant

## 2023-09-24 NOTE — Telephone Encounter (Signed)
 Call received from patient. Pt heart rate is in the high 40s and low 50s. She took propranolol today and has felt unwell since.   She took 2 propranolol tablets yesterday and then [place] chest [place] - EMT dispatched and EKG was "ok" yesterday. She returned home and did not board her plane.   Today, she took an additional propranolol and now has lack of energy. She also did not take her ritalin today.  Sounds like the dose of propranolol that was prescribed may be too high for her. Given chest tightness and feeling unswell with braydcardia and no BP reading available, I referred her to the ER for further evaluation and cardiac monitoring. If sinus bradycardia and adequate BP, would need to let BB washout and resume ?with a lower dose.  Unfortunately, difficult to manage feeling unwell with bradycardia and chest tightness yesterday with BP reading or rhythm.   She agrees to proceed to [facility].   Marcelino Duster, PA-C 09/24/2023, 12:58 PM [phone] [facility] [address] [place], [place] [address]

## 2023-09-25 ENCOUNTER — Other Ambulatory Visit (HOSPITAL_COMMUNITY): Payer: Self-pay

## 2023-09-25 NOTE — Telephone Encounter (Signed)
 Pt did not go to med center and is still having issues, requesting cb to discuss

## 2023-09-25 NOTE — Telephone Encounter (Signed)
 Spent 25 minutes of phone with patient.   When asked pt why she did not go to ED as recommended, she said she thought she was given 2 choices, one of them did not involve the ED. She says she is still "having a lot of irregular heart beats", she does feel some pressure at times but generally not feeling great, she says it is probably anxiety related as well.  Says that it feels different than several years ago and occurring daily.  Says it woke her up twice recently, says it "felt like it was vibrating and she did experience some perspiration".  This is causing more anxiety for pt. She is also taking Lopressor 12.5 mg once a day.  Aware this is not therapeutic dosing and could be part of the cause, but also understands with hypotension/bradycardia it can cause limitations to treating her. Advised pt to obtain a BP monitor as this will be important in helping to treat her. Aware forwarding to MD for advisement and that she probably needs an updated monitor to determine what exactly the "irregularities" are, but will await MD advisement.  Patient verbalized understanding and agreeable to plan.

## 2023-09-25 NOTE — Telephone Encounter (Signed)
 Returned a call back to the pt.   Was able to make her an appt to see Dr. Jacinto Halim on this Thursday 3/20 at 2:40 pm.  This is a double book and okayed per Dr. Jacinto Halim.    Pt aware to arrive 15-20 mins prior to this appt time.   Pt verbalized understanding and agrees with this plan.

## 2023-09-25 NOTE — Telephone Encounter (Signed)
 Please double book her on my first day in office.

## 2023-09-28 ENCOUNTER — Encounter: Payer: Self-pay | Admitting: Cardiology

## 2023-09-28 ENCOUNTER — Ambulatory Visit: Attending: Cardiology | Admitting: Cardiology

## 2023-09-28 ENCOUNTER — Ambulatory Visit (INDEPENDENT_AMBULATORY_CARE_PROVIDER_SITE_OTHER)

## 2023-09-28 VITALS — BP 112/82 | HR 95 | Resp 16 | Ht 68.0 in | Wt 173.0 lb

## 2023-09-28 DIAGNOSIS — E7800 Pure hypercholesterolemia, unspecified: Secondary | ICD-10-CM

## 2023-09-28 DIAGNOSIS — R072 Precordial pain: Secondary | ICD-10-CM

## 2023-09-28 DIAGNOSIS — R002 Palpitations: Secondary | ICD-10-CM | POA: Diagnosis not present

## 2023-09-28 NOTE — Patient Instructions (Signed)
 Medication Instructions:  Your physician recommends that you continue on your current medications as directed. Please refer to the Current Medication list given to you today.  *If you need a refill on your cardiac medications before your next appointment, please call your pharmacy*   Lab Work: none If you have labs (blood work) drawn today and your tests are completely normal, you will receive your results only by: MyChart Message (if you have MyChart) OR A paper copy in the mail If you have any lab test that is abnormal or we need to change your treatment, we will call you to review the results.   Testing/Procedures: Your physician has requested that you have an exercise tolerance test. For further information please visit [email or web address] Please also follow instruction sheet, as given.  Dr Jacinto Halim recommends you wear a zio patch monitor for 2 weeks.    Follow-Up: At [facility], you and your health needs are our priority.  As part of our continuing mission to provide you with exceptional heart care, we have created designated Provider Care Teams.  These Care Teams include your primary Cardiologist (physician) and Advanced Practice Providers (APPs -  Physician Assistants and Nurse Practitioners) who all work together to provide you with the care you need, when you need it.  We recommend signing up for the patient portal called "MyChart".  Sign up information is provided on this After Visit Summary.  MyChart is used to connect with patients for Virtual Visits (Telemedicine).  Patients are able to view lab/test results, encounter notes, upcoming appointments, etc.  Non-urgent messages can be sent to your provider as well.   To learn more about what you can do with MyChart, go to [email or web address].    Your next appointment:   2 month(s)  Provider:   Yates Decamp, MD     Other Instructions .   Please arrive 15 minutes prior to your appointment time to allow for  registration and insurance purposes.  The test will take approximately 45 minutes to complete.  How to prepare for your Exercise Stress Test: - Do bring a list of your current medications with you. If you do not take any of the medications listed below, you may take your medications as normal the day of the test. - Do NOT take _metoprolol and propranolol __ for one week prior to the test Bring this medication to your appointment as you may be required to take it once the test is complete. - DO wear comfortable clothes (no dresses or overalls) and walking shoes, tennis shoes preferred (no heels or open toed shoes allowed).   ZIO XT- Long Term Monitor Instructions  Your physician has requested you wear a ZIO patch monitor for 14 days.  This is a single patch monitor. [employer] supplies one patch monitor per enrollment. Additional stickers are not available. Please do not apply patch if you will be having a Nuclear Stress Test,  Echocardiogram, Cardiac CT, MRI, or Chest Xray during the period you would be wearing the  monitor. The patch cannot be worn during these tests. You cannot remove and re-apply the  ZIO XT patch monitor.  Your ZIO patch monitor will be mailed 3 day USPS to your address on file. It may take 3-5 days  to receive your monitor after you have been enrolled.  Once you have received your monitor, please review the enclosed instructions. Your monitor  has already been registered assigning a specific monitor serial # to you.  Billing and Patient  Assistance Program Information  We have supplied [employer] with any of your insurance information on file for billing purposes. [employer] offers a sliding scale Patient Assistance Program for patients that do not have  insurance, or whose insurance does not completely cover the cost of the ZIO monitor.  You must apply for the Patient Assistance Program to qualify for this discounted rate.  To apply, please call [employer] at [phone],  select option 4, select option 2, ask to apply for  Patient Assistance Program. Meredeth Ide will ask your household income, and how many people  are in your household. They will quote your out-of-pocket cost based on that information.  [employer] will also be able to set up a 86-month, interest-free payment plan if needed.  Applying the monitor   Shave hair from upper left chest.  Hold abrader disc by orange tab. Rub abrader in 40 strokes over the upper left chest as  indicated in your monitor instructions.  Clean area with 4 enclosed alcohol pads. Let dry.  Apply patch as indicated in monitor instructions. Patch will be placed under collarbone on left  side of chest with arrow pointing upward.  Rub patch adhesive wings for 2 minutes. Remove white label marked "1". Remove the white  label marked "2". Rub patch adhesive wings for 2 additional minutes.  While looking in a mirror, press and release button in center of patch. A small green light will  flash 3-4 times. This will be your only indicator that the monitor has been turned on.  Do not shower for the first 24 hours. You may shower after the first 24 hours.  Press the button if you feel a symptom. You will hear a small click. Record Date, Time and  Symptom in the Patient Logbook.  When you are ready to remove the patch, follow instructions on the last 2 pages of Patient  Logbook. Stick patch monitor onto the last page of Patient Logbook.  Place Patient Logbook in the blue and white box. Use locking tab on box and tape box closed  securely. The blue and white box has prepaid postage on it. Please place it in the mailbox as  soon as possible. Your physician should have your test results approximately 7 days after the  monitor has been mailed back to [facility].  Call [facility] Customer Care at [phone] if you have questions regarding  your ZIO XT patch monitor. Call them immediately if you see an orange light blinking on your   monitor.  If your monitor falls off in less than 4 days, contact our Monitor department at [phone].  If your monitor becomes loose or falls off after 4 days call [employer] at [phone] for  suggestions on securing your monitor    1st Floor: - Lobby - Registration  - Pharmacy  - Lab - Cafe  2nd Floor: - PV Lab - Diagnostic Testing (echo, CT, nuclear med)  3rd Floor: - Vacant  4th Floor: - TCTS (cardiothoracic surgery) - AFib Clinic - Structural Heart Clinic - Vascular Surgery  - Vascular Ultrasound  5th Floor: - HeartCare Cardiology (general and EP) - Clinical Pharmacy for coumadin, hypertension, lipid, weight-loss medications, and med management appointments    Valet parking services will be available as well.

## 2023-09-28 NOTE — Progress Notes (Unsigned)
 Enrolled for [employer] to mail a ZIO XT long term holter monitor to the patients address on file.

## 2023-09-28 NOTE — Progress Notes (Signed)
 Cardiology Office Note:  .   Date:  10/01/2023  ID:  Shannon Swanson, DOB [birth year], MRN [id number] PCP: Ailene Ravel, MD  [facility] Providers Cardiologist:  Yates Decamp, MD   History of Present Illness: .   Shannon Swanson is a 54 y.o. complex female patient with anxiety, ADD, work-related traumatic injury leading to multiple surgeries and of neck, told to have narcolepsy/complex sleep apnea in the past and was recommended CPAP, referred to me for evaluation of palpitations.  She has hyperglycemia, hypercholesterolemia.  Most episodes of palpitation occurring during rest and routine activity then with exertion activity lasting a few minutes.  No other associated symptoms.   I had seen her on 09/12/2023, felt that her symptoms of palpitations are related to PACs and PVCs and recommended trying propranolol 20 mg as needed but also to continue to use metoprolol succinate as needed which are once she felt comfortable.  She was [place] and on 09/24/2023 called in stating that in the airport she had significant chest discomfort and palpitations and called EMS where EKG was unremarkable, she canceled the treatment return home and was also complaining of severe chest pain hence urgent appointment made with me today.  Discussed the use of AI scribe software for clinical note transcription with the patient, who gave verbal consent to proceed.  History of Present Illness Shannon Swanson is a 53 year old female who presents with chest pain and palpitations. She is accompanied by her daughter. She had an episode of chest pain and palpitations while going on a mission trip and was about to board an airplane, but cancelled it and came home. EMS was dispatched, physical exam and EKG was reassuring.   She experiences significant chest pressure and palpitations, with a sensation of her heart being out of rhythm. These symptoms intensified recently, leading to increased  anxiety. She has a history of palpitations and irregular heart rhythms, previously monitored in 2021. Palpitations often wake her from sleep, accompanied by sweating.  She started propranolol recently but experienced increased chest pressure and panic symptoms, leading her to stop it. Her heart rate dropped to 49-53 bpm, which she attributes to propranolol. She resumed metoprolol, initially prescribed at 25 mg by urgent care.  She uses a pulse oximeter and her watch to monitor her heart rate and oxygen levels, noting occasional oxygen saturation drops to 93-94%. Episodes of chest tightness and pressure last a few minutes, with a heart rate of 90 bpm and incomplete right bundle branch block on EKG.  Her family history includes heart disease, with her mother having a heart attack before age 77.    Labs   Lab Results  Component Value Date   CHOL 179 10/03/2013   HDL 52.40 10/03/2013   LDLCALC 115 (H) 10/03/2013   TRIG 58.0 10/03/2013   CHOLHDL 3 10/03/2013   Lab Results  Component Value Date   NA 139 08/16/2023   K 4.4 08/16/2023   CO2 26 08/16/2023   GLUCOSE 96 08/16/2023   BUN 13 08/16/2023   CREATININE 0.90 08/16/2023   CALCIUM 9.8 08/16/2023   GFR 74.57 11/25/2014   EGFR 76 08/16/2023   GFRNONAA >60 11/20/2021      Latest Ref Rng & Units 08/16/2023   11:44 AM 11/20/2021    4:32 PM 10/30/2020   11:36 AM  BMP  Glucose 65 - 99 mg/dL 96  086  578   BUN 7 - 25 mg/dL 13  12  9   Creatinine 0.50 - 1.03 mg/dL 1.61  0.96  0.45   BUN/Creat Ratio 6 - 22 (calc) SEE NOTE:     Sodium 135 - 146 mmol/L 139  140  141   Potassium 3.5 - 5.3 mmol/L 4.4  3.7  3.9   Chloride 98 - 110 mmol/L 104  106  104   CO2 20 - 32 mmol/L 26  26    Calcium 8.6 - 10.4 mg/dL 9.8  9.5        Latest Ref Rng & Units 08/16/2023   11:44 AM 11/20/2021    4:32 PM 10/30/2020   11:36 AM  CBC  WBC 3.8 - 10.8 Thousand/uL 6.0  19.3    Hemoglobin 11.7 - 15.5 g/dL 40.9  81.1  91.4   Hematocrit 35.0 - 45.0 % 42.6  40.9   44.0   Platelets 140 - 400 Thousand/uL 350  301     No results found for: "HGBA1C"  Lab Results  Component Value Date   TSH 1.28 11/25/2014    Review of Systems  Cardiovascular:  Positive for chest pain and irregular heartbeat. Negative for dyspnea on exertion and leg swelling.  Psychiatric/Behavioral:  Positive for hypervigilance. The patient is nervous/anxious.    Physical Exam:   VS:  BP 112/82 (BP Location: Left Arm, Patient Position: Sitting, Cuff Size: Normal)   Pulse 95   Resp 16   Ht 5\' 8"  (1.727 m)   Wt 173 lb (78.5 kg)   LMP  (LMP Unknown)   SpO2 96%   BMI 26.30 kg/m    Wt Readings from Last 3 Encounters:  09/28/23 173 lb (78.5 kg)  09/12/23 176 lb (79.8 kg)  07/24/23 178 lb (80.7 kg)    Physical Exam Neck:     Vascular: No carotid bruit or JVD.  Cardiovascular:     Rate and Rhythm: Normal rate and regular rhythm.     Pulses: Intact distal pulses.     Heart sounds: Normal heart sounds. No murmur heard.    No gallop.  Pulmonary:     Effort: Pulmonary effort is normal.     Breath sounds: Normal breath sounds.  Abdominal:     General: Bowel sounds are normal.     Palpations: Abdomen is soft.  Musculoskeletal:     Right lower leg: No edema.     Left lower leg: No edema.    Studies Reviewed: Marland Kitchen     EKG:    EKG Interpretation Date/Time:  Thursday September 28 2023 14:27:28 EDT Ventricular Rate:  90 PR Interval:  134 QRS Duration:  82 QT Interval:  362 QTC Calculation: 442 R Axis:   10  Text Interpretation: EKG 09/28/2023: Normal sinus rhythm at rate of 90 bpm, incomplete right bundle branch block.  Normal EKG.  Compared to 09/12/2023, heart rate has increased from 79 bpm otherwise normal. Confirmed by Delrae Rend [phone]) on 09/28/2023 3:32:10 PM    Medications and allergies    Allergies  Allergen Reactions   Statins Other (See Comments)    Rhabdomyolysis   Meperidine Hcl Other (See Comments)    Severe headache   Lactose Intolerance (Gi)     Upset  stomach    Codeine Nausea Only    TAKES ZOFRAN WITH FOR NAUSEA     Current Outpatient Medications:    acetaminophen (TYLENOL) 325 MG tablet, Take 650 mg by mouth every 6 (six) hours as needed., Disp: , Rfl:    albuterol (VENTOLIN HFA)  108 (90 Base) MCG/ACT inhaler, Inhale 1-2 puffs into the lungs every 4 (four) hours as needed for wheezing or shortness of breath., Disp: , Rfl:    diazepam (VALIUM) 2 MG tablet, Take 1/2 - 1 tablet by mouth twice daily as needed for panic or PTSD symptoms, Disp: 30 tablet, Rfl: 1   escitalopram (LEXAPRO) 20 MG tablet, Take 1 tablet (20 mg total) by mouth daily., Disp: 90 tablet, Rfl: 1   fluticasone (FLONASE) 50 MCG/ACT nasal spray, Place 1 spray into both nostrils daily., Disp: 16 g, Rfl: 0   ibuprofen (ADVIL) 200 MG tablet, Take 200 mg by mouth every 6 (six) hours as needed., Disp: , Rfl:    ketorolac (TORADOL) 10 MG tablet, Take 1 tablet (10 mg total) by mouth every 6 (six) hours as needed., Disp: 5 tablet, Rfl: 0   lactase (LACTAID) 3000 UNITS tablet, Take 9,000 Units by mouth 3 (three) times daily with meals., Disp: , Rfl:    meloxicam (MOBIC) 7.5 MG tablet, Take 1 tablet (7.5 mg total) by mouth 2 (two) times daily., Disp: 60 tablet, Rfl: 0   methylphenidate (RITALIN) 5 MG tablet, Take 1 tablet (5 mg total) by mouth 3 (three) times daily., Disp: 90 tablet, Rfl: 0   metoprolol tartrate (LOPRESSOR) 25 MG tablet, Take 25 mg by mouth daily., Disp: , Rfl:    ondansetron (ZOFRAN-ODT) 4 MG disintegrating tablet, Take 1 tablet (4 mg total) by mouth every 8 (eight) hours as needed for nausea or vomiting., Disp: 20 tablet, Rfl: 0   tiZANidine (ZANAFLEX) 4 MG tablet, Take 1 tablet (4 mg total) by mouth 3 (three) times daily., Disp: 30 tablet, Rfl: 0   traMADol (ULTRAM) 50 MG tablet, Take 1 tablet (50 mg total) by mouth every 6 (six) hours as needed., Disp: 40 tablet, Rfl: 0   propranolol (INDERAL) 20 MG tablet, Take 1 tablet (20 mg total) by mouth 3 (three) times daily  as needed. (Patient not taking: Reported on 09/28/2023), Disp: 90 tablet, Rfl: 2   ASSESSMENT AND PLAN: .      ICD-10-CM   1. Precordial pain  R07.2 EKG 12-Lead    Informed Consent Details: Physician/Practitioner Attestation; Transcribe to consent form and obtain patient signature    EXERCISE TOLERANCE TEST (ETT)    LONG TERM MONITOR (3-14 DAYS)    2. Palpitations  R00.2 EKG 12-Lead    Informed Consent Details: Physician/Practitioner Attestation; Transcribe to consent form and obtain patient signature    EXERCISE TOLERANCE TEST (ETT)    LONG TERM MONITOR (3-14 DAYS)    3. Hypercholesteremia  E78.00 EXERCISE TOLERANCE TEST (ETT)    LONG TERM MONITOR (3-14 DAYS)      No orders of the defined types were placed in this encounter.   There are no discontinued medications.   Assessment and Plan  he patient has a history of high cholesterol, previously managed with Vytorin 10/40, but this was discontinued due to associated leg pain and elevated CK enzymes. Assessment & Plan Palpitations and Chest Discomfort   She experienced palpitations and chest discomfort, worsened by anxiety and possibly linked to medication changes. Symptoms included a low heart rate of 49 bpm, chest pressure, and panic feelings. Differential diagnosis includes cardiac causes like arrhythmias and non-cardiac causes such as anxiety or panic attacks. EKG was normal, but further evaluation is necessary to rule out cardiac causes. A treadmill stress test is recommended to assess for coronary artery disease, with an 80-90% accuracy in ruling out significant disease  if no EKG abnormalities are present. A coronary CT angiogram, with a 95-98% accuracy, is considered if the treadmill stress test is inconclusive. Order a treadmill stress test to evaluate for coronary artery disease and assess heart rate response. Order a Cytogeneticist for two weeks to monitor heart rhythm and identify any arrhythmias. She should avoid beta blockers the  day before and the day of the stress test. Consider a coronary CT angiogram if the treadmill stress test is inconclusive. Discuss potential anxiety-related causes if the cardiac workup is negative.  Medication Management   She was previously on metoprolol and propranolol for palpitations and anxiety. Adverse effects with propranolol, including low heart rate and fatigue, led to its discontinuation. She resumed metoprolol but is considering propranolol at a lower dose. The goal is to find an effective medication regimen with minimal side effects. Propranolol may be tried at a lower dose, but if not tolerated, metoprolol succinate is an alternative. Instruct her to try propranolol at half the dose, two or three times a day, and monitor response. If propranolol is not tolerated, switch back to metoprolol succinate. Advise her to communicate any medication changes or adverse effects.  Hyperlipidemia   She has hyperlipidemia, not currently treated with statins due to previous statin-induced myositis and rhabdomyolysis. Cholesterol levels are slightly elevated but not excessively high. The risk of coronary artery disease is being evaluated. If the CT angiogram shows high-risk features, alternative lipid-lowering therapies will be considered. Consider alternative lipid-lowering therapies if the CT angiogram shows high-risk features. Encourage lifestyle modifications, including regular exercise and dietary changes, to manage cholesterol levels. If GXT is normal, will consider coronary calcium score.        Signed,  Yates Decamp, MD, [facility] 10/01/2023, 7:51 AM [facility] [address] [place], [place] [address] Phone: [phone]. Fax:  [phone]

## 2023-10-05 ENCOUNTER — Ambulatory Visit: Attending: Internal Medicine

## 2023-10-05 ENCOUNTER — Encounter: Payer: Self-pay | Admitting: Cardiology

## 2023-10-05 DIAGNOSIS — R072 Precordial pain: Secondary | ICD-10-CM | POA: Diagnosis not present

## 2023-10-05 DIAGNOSIS — E7800 Pure hypercholesterolemia, unspecified: Secondary | ICD-10-CM | POA: Diagnosis not present

## 2023-10-05 DIAGNOSIS — R002 Palpitations: Secondary | ICD-10-CM | POA: Diagnosis not present

## 2023-10-05 DIAGNOSIS — M79662 Pain in left lower leg: Secondary | ICD-10-CM | POA: Diagnosis not present

## 2023-10-05 DIAGNOSIS — M79661 Pain in right lower leg: Secondary | ICD-10-CM | POA: Diagnosis not present

## 2023-10-05 LAB — EXERCISE TOLERANCE TEST
Angina Index: 1
Estimated workload: 11.7
Exercise duration (min): 10 min
Exercise duration (sec): 0 s
MPHR: 166 {beats}/min
Peak HR: 162 {beats}/min
Percent HR: 97 %
RPE: 19
Rest HR: 85 {beats}/min

## 2023-10-05 NOTE — Progress Notes (Signed)
 I discussed the results of the stress test, equivocal EKG changes with significant ST depression noted in the inferolateral leads that persisted for 3 minutes into recovery, also patient had chest pain not reported in the report.  I would recommend coronary CT angiogram.  Patient was advised to take metoprolol to tartrate 25 mg twice daily 1 day before the procedure and 25 mg in the morning on the day of the procedure and 1 hour before the procedure.

## 2023-10-06 ENCOUNTER — Other Ambulatory Visit (HOSPITAL_COMMUNITY): Payer: Self-pay

## 2023-10-06 NOTE — Telephone Encounter (Signed)
 I spoke with patient and reviewed CT instructions with her and will send copy to her through my chart.  She does not need prescription for metoprolol as she has this at home. Patient will have BMP done at [facility]

## 2023-10-17 ENCOUNTER — Other Ambulatory Visit (HOSPITAL_COMMUNITY): Payer: Self-pay

## 2023-10-17 DIAGNOSIS — R072 Precordial pain: Secondary | ICD-10-CM | POA: Diagnosis not present

## 2023-10-18 ENCOUNTER — Encounter: Payer: Self-pay | Admitting: Cardiology

## 2023-10-18 ENCOUNTER — Telehealth (HOSPITAL_COMMUNITY): Payer: Self-pay | Admitting: *Deleted

## 2023-10-18 LAB — BASIC METABOLIC PANEL WITH GFR
BUN/Creatinine Ratio: 13 (ref 9–23)
BUN: 12 mg/dL (ref 6–24)
CO2: 25 mmol/L (ref 20–29)
Calcium: 10.1 mg/dL (ref 8.7–10.2)
Chloride: 102 mmol/L (ref 96–106)
Creatinine, Ser: 0.96 mg/dL (ref 0.57–1.00)
Glucose: 75 mg/dL (ref 70–99)
Potassium: 4.8 mmol/L (ref 3.5–5.2)
Sodium: 142 mmol/L (ref 134–144)
eGFR: 70 mL/min/{1.73_m2} (ref >59)

## 2023-10-18 NOTE — Telephone Encounter (Signed)
 Reaching out to patient to offer assistance regarding upcoming cardiac imaging study; pt verbalizes understanding of appt date/time, parking situation and where to check in, pre-test NPO status and medications ordered, and verified current allergies; name and call back number provided for further questions should they arise  Larey Brick RN Navigator Cardiac Imaging [facility] [phone] office [phone] cell  Patient to take her daily metoprolol. She is aware to arrive at 1:30 PM

## 2023-10-19 DIAGNOSIS — F332 Major depressive disorder, recurrent severe without psychotic features: Secondary | ICD-10-CM | POA: Diagnosis not present

## 2023-10-19 DIAGNOSIS — F411 Generalized anxiety disorder: Secondary | ICD-10-CM | POA: Diagnosis not present

## 2023-10-19 DIAGNOSIS — F4310 Post-traumatic stress disorder, unspecified: Secondary | ICD-10-CM | POA: Diagnosis not present

## 2023-10-20 ENCOUNTER — Other Ambulatory Visit: Payer: Self-pay

## 2023-10-20 ENCOUNTER — Ambulatory Visit (HOSPITAL_COMMUNITY)
Admission: RE | Admit: 2023-10-20 | Discharge: 2023-10-20 | Disposition: A | Discharge status: Home / Self Care | Source: Ambulatory Visit | Attending: Cardiology | Admitting: Cardiology

## 2023-10-20 DIAGNOSIS — R072 Precordial pain: Secondary | ICD-10-CM | POA: Insufficient documentation

## 2023-10-20 MED ORDER — NITROGLYCERIN 0.4 MG SL SUBL
0.8000 mg | SUBLINGUAL_TABLET | Freq: Once | SUBLINGUAL | Status: AC
  Administered 2023-10-20: 0.8 mg via SUBLINGUAL

## 2023-10-20 MED ORDER — NITROGLYCERIN 0.4 MG SL SUBL
SUBLINGUAL_TABLET | SUBLINGUAL | Status: AC
  Filled 2023-10-20: qty 2

## 2023-10-20 MED ORDER — IOHEXOL 350 MG/ML SOLN
95.0000 mL | Freq: Once | INTRAVENOUS | Status: AC | PRN
  Administered 2023-10-20: 95 mL via INTRAVENOUS

## 2023-10-23 ENCOUNTER — Encounter: Payer: Self-pay | Admitting: Cardiology

## 2023-10-23 DIAGNOSIS — G4730 Sleep apnea, unspecified: Secondary | ICD-10-CM

## 2023-10-23 NOTE — Progress Notes (Signed)
 Shannon Swanson, Completely normal cardiac CTA and also coronary calcium score is 0 that means very low risk for cardiac issues (blockages). 0 is best and anything more than 0 would mean risk. So as suspected, your chest pain is non-cardiac (not related to heart). Hope this helps and makes you feel better.

## 2023-10-24 NOTE — Telephone Encounter (Signed)
 Can you please refer her for sleep

## 2023-10-25 ENCOUNTER — Ambulatory Visit (INDEPENDENT_AMBULATORY_CARE_PROVIDER_SITE_OTHER): Payer: 59 | Admitting: Psychiatry

## 2023-10-25 DIAGNOSIS — F411 Generalized anxiety disorder: Secondary | ICD-10-CM | POA: Diagnosis not present

## 2023-10-25 NOTE — Progress Notes (Signed)
 [facility] Counselor/Therapist Progress Note  Patient ID: Shannon Swanson, MRN: [id number],    Date: 10/25/2023  Time Spent: 61 minutes start time 11:05 AM end time 12:06  Treatment Type: Individual Therapy  Reported Symptoms: anxiety, health issues, sadness, triggered responses, panic, flashbacks, nightmares, rumination, obsessive thinking, memory issues, focusing issues  Mental Status Exam:  Appearance:   Well Groomed     Behavior:  Appropriate  Motor:  Normal  Speech/Language:   Normal Rate  Affect:  Appropriate and Tearful  Mood:  anxious and sad  Thought process:  normal  Thought content:    WNL  Sensory/Perceptual disturbances:    WNL at the beginning of session, once she got very anxious in session she had to lay down due to feeling dizzy and heart rate increase  Orientation:  oriented to person, place, time/date, and situation  Attention:  Good  Concentration:  Good  Memory:  Immediate;   Fair  Fund of knowledge:   Good  Insight:    Good  Judgment:   Good  Impulse Control:  Good   Risk Assessment: Danger to Self:  No Self-injurious Behavior: No Danger to Others: No Duty to Warn:no Physical Aggression / Violence:No  Access to Firearms a concern: No  Gang Involvement:No   Subjective: Patient was present for session. She shared that a lot has happened since last session. She shared she was not doing well. She is continuing to with a cardiologist and he wants her to have a sleep study. She shared so far things have been normal.She shared she was not able to go on her trip due to having to EMTs come out due to having an irregular rhythm. She shared that she is still having memories of BH patient. Had patient discuss what she is seeing in her head, she shared she saw her as a sweet person and than flipping and becoming aggressive and seeing her act out. SUDS level  10  negative cognition "I'm a failure" anxiety in her heart.  Unfortunately after discussing the  situation she became very upset and had to lay down due to feeling dizzy and heart racing.  Had patient do some breathing and talked her through some visualizations.  Then had patient think through more details surrounding the situation as well as other traumas in her life.  Discussed the fact that the grief of losing her husband had never been addressed in treatment.  Patient was able to recognize that there was trauma and attached to her husband's death and she shared that in session.  She went on to explain at that time she was still feeling strong and not a good place so she was able to move through things and continue feeling that she could function.  Encouraged her to recognize that his not being here is probably making this whole situation more difficult because she feels the stress financially and emotionally all on her own.  Had her think through what she needed to tell herself now and ways to help her stay focused on getting positive brain chemistry.  Discussed how negative thoughts and feelings released the negative brain chemistry which is impacting her physically so she has to keep putting in positive thoughts to get a better response.  Patient was able to think through things that she could listen to through the day even if she cannot function she can listen to the positive things and get that going in her head.  Also encouraged her to  set up 5-minute tasks that she knows she can do so that she can affirm herself that she still has a purpose and can do things on her own.  The patient was also encouraged to continue working with her providers to find out what is going on with her heart and other physical issues as well as taking all medication as directed.  Interventions: Cognitive Behavioral Therapy, Solution-Oriented/Positive Psychology, and Insight-Oriented  Diagnosis:   ICD-10-CM   1. Generalized anxiety disorder  F41.1       Plan: Patient is to use CBT and coping skills to decrease  triggered responses.  Patient is to work on following plans from Restaurant manager, fast food discussed, work on listening to positive podcast and music regularly, and work on giving herself 5-minute tasks to do throughout the day when she feels capable.  Patient is to work on journaling symptoms feelings and triggers.  Patient is to work on finding things that give her life purpose.  Patient is to work on breaking things up into small pieces that she feels like she can accomplish.  Patient is to work on thinking of positives and affirming herself.  Patient is to look at podcast by Dr. Evlyn Hoffmann on neuro cycle changes.  Patient is to work on reminding herself that she is enough and still has a purpose even when she is unable to work.  She is to try and focus on concrete things that she can still do.  Patient is to continue working with providers on her medical issues.  Patient is to take medication as directed   Marlise Simpers, LCMHC

## 2023-10-26 DIAGNOSIS — R072 Precordial pain: Secondary | ICD-10-CM | POA: Diagnosis not present

## 2023-10-31 ENCOUNTER — Encounter: Payer: Self-pay | Admitting: Cardiology

## 2023-10-31 DIAGNOSIS — R002 Palpitations: Secondary | ICD-10-CM

## 2023-10-31 DIAGNOSIS — R072 Precordial pain: Secondary | ICD-10-CM

## 2023-10-31 NOTE — Progress Notes (Signed)
 Symptomatic episodes correlate with PACs and brief atrial tachycardia.  These are very benign and not dangerous and happens fairly frequently undetected in many individuals.  These are just extra heartbeats coming from the upper chamber of the heart.  I did not see any other major irregular heart rhythms.  Your symptoms were related to as previously discussed PACs.  PAC = Premature atrial complexes: These arise from the upper chamber of the heart.  These are very common and are not dangerous.  Extra skipped beat coming from the upper chamber (atrium) and mostly are life altering (nuisance) than life threatening and mostly treated by reassurance.  There may not be any specific reasons for this, however patients with excessive caffeine, anxiety, lack of sleep, alcohol or thyroid  problems can have these episodes.

## 2023-11-21 ENCOUNTER — Other Ambulatory Visit: Payer: Self-pay | Admitting: Psychiatry

## 2023-11-21 ENCOUNTER — Other Ambulatory Visit (HOSPITAL_COMMUNITY): Payer: Self-pay

## 2023-11-21 DIAGNOSIS — F900 Attention-deficit hyperactivity disorder, predominantly inattentive type: Secondary | ICD-10-CM

## 2023-11-21 MED ORDER — TRAMADOL HCL 50 MG PO TABS
50.0000 mg | ORAL_TABLET | Freq: Four times a day (QID) | ORAL | 0 refills | Status: AC | PRN
  Filled 2023-11-21 – 2024-01-17 (×2): qty 40, 10d supply, fill #0

## 2023-11-21 MED ORDER — TIZANIDINE HCL 4 MG PO TABS
4.0000 mg | ORAL_TABLET | Freq: Three times a day (TID) | ORAL | 0 refills | Status: DC
  Filled 2023-11-21: qty 30, 10d supply, fill #0

## 2023-11-21 NOTE — Telephone Encounter (Signed)
 Pt was Jessica's pt, does not have an appt with another [facility] provider. Please call to see who she is following with.

## 2023-11-22 ENCOUNTER — Other Ambulatory Visit: Payer: Self-pay

## 2023-11-22 ENCOUNTER — Other Ambulatory Visit (HOSPITAL_COMMUNITY): Payer: Self-pay

## 2023-11-22 MED ORDER — MELOXICAM 7.5 MG PO TABS
7.5000 mg | ORAL_TABLET | Freq: Two times a day (BID) | ORAL | 0 refills | Status: DC
  Filled 2023-11-22: qty 60, 30d supply, fill #0

## 2023-11-22 NOTE — Telephone Encounter (Signed)
 Sent My-Chart message

## 2023-11-23 ENCOUNTER — Other Ambulatory Visit (HOSPITAL_COMMUNITY): Payer: Self-pay

## 2023-11-23 DIAGNOSIS — F411 Generalized anxiety disorder: Secondary | ICD-10-CM | POA: Diagnosis not present

## 2023-11-23 DIAGNOSIS — F4310 Post-traumatic stress disorder, unspecified: Secondary | ICD-10-CM | POA: Diagnosis not present

## 2023-11-23 DIAGNOSIS — F332 Major depressive disorder, recurrent severe without psychotic features: Secondary | ICD-10-CM | POA: Diagnosis not present

## 2023-11-23 MED ORDER — ESCITALOPRAM OXALATE 20 MG PO TABS
20.0000 mg | ORAL_TABLET | Freq: Every day | ORAL | 1 refills | Status: AC
  Filled 2023-11-23 – 2023-12-21 (×2): qty 30, 30d supply, fill #0
  Filled 2024-01-17: qty 30, 30d supply, fill #1
  Filled 2024-02-21 (×2): qty 30, 30d supply, fill #2

## 2023-11-23 MED ORDER — DIAZEPAM 2 MG PO TABS
2.0000 mg | ORAL_TABLET | Freq: Every day | ORAL | 3 refills | Status: DC | PRN
  Filled 2023-11-23: qty 30, 30d supply, fill #0

## 2023-11-23 MED ORDER — METHYLPHENIDATE HCL 5 MG PO TABS: 5.0000 mg | ORAL_TABLET | Freq: Three times a day (TID) | ORAL | 0 refills | Status: DC

## 2023-11-23 MED ORDER — METHYLPHENIDATE HCL 5 MG PO TABS
5.0000 mg | ORAL_TABLET | Freq: Three times a day (TID) | ORAL | 0 refills | Status: DC
  Filled 2023-11-23 – 2023-11-24 (×2): qty 90, 30d supply, fill #0

## 2023-11-23 MED ORDER — METHYLPHENIDATE HCL 5 MG PO TABS
5.0000 mg | ORAL_TABLET | Freq: Three times a day (TID) | ORAL | 0 refills | Status: DC
Start: 2023-12-21 — End: 2023-11-28

## 2023-11-24 ENCOUNTER — Other Ambulatory Visit (HOSPITAL_COMMUNITY): Payer: Self-pay

## 2023-11-27 NOTE — Telephone Encounter (Signed)
 Patient stated Dr. Berry Bristol placed a referral for the patient to complete a sleep studies. Patient stated she never received a call in regard to this. Patient would like to know if she needs to reschedule her appointment with Dr. Berry Bristol on 11/28/23 at 10 AM. Please advise.

## 2023-11-28 ENCOUNTER — Ambulatory Visit: Attending: Cardiology | Admitting: Cardiology

## 2023-11-28 ENCOUNTER — Encounter: Payer: Self-pay | Admitting: Cardiology

## 2023-11-28 ENCOUNTER — Encounter (INDEPENDENT_AMBULATORY_CARE_PROVIDER_SITE_OTHER): Payer: Self-pay

## 2023-11-28 ENCOUNTER — Other Ambulatory Visit (HOSPITAL_COMMUNITY): Payer: Self-pay

## 2023-11-28 VITALS — BP 128/80 | HR 94 | Ht 68.0 in | Wt 173.0 lb

## 2023-11-28 DIAGNOSIS — R002 Palpitations: Secondary | ICD-10-CM | POA: Diagnosis not present

## 2023-11-28 DIAGNOSIS — G479 Sleep disorder, unspecified: Secondary | ICD-10-CM

## 2023-11-28 NOTE — Patient Instructions (Signed)

## 2023-11-28 NOTE — Progress Notes (Signed)
 Cardiology Office Note:  .   Date:  11/28/2023  ID:  Shannon Swanson, DOB 05-24-1971, MRN [id number] PCP: Annette Barters, MD  [facility] Providers Cardiologist:  Knox Perl, MD   History of Present Illness: .   Shannon Swanson is a 53 y.o. complex female patient with anxiety, ADD, work-related traumatic injury leading to multiple surgeries and of neck, told to have narcolepsy/complex sleep apnea in the past and was recommended CPAP, hyperglycemia, very mild hypercholesterolemia and chronic palpitations.  Coronary CT angiogram on 10/20/2023 revealing coronary calcium score of 0 with normal coronary arteries.  Discussed the use of AI scribe software for clinical note transcription with the patient, who gave verbal consent to proceed.  History of Present Illness Shannon Swanson is a 53 year old female with complex sleep apnea who presents with excessive daytime sleepiness and fatigue.  She experiences palpitations and irregular heart rhythms. A phone-based EKG often returns inconclusive results. She has brief episodes of supraventricular tachycardia (SVT) and takes metoprolol as needed. An echocardiogram in September 2024 was normal. A coronary CT angiogram in April 2025 showed normal coronary arteries with a calcium score of zero.  She is attempting to reduce caffeine intake, suspecting it may contribute to heart irregularities. Despite this, she feels more sleepy without caffeine. Her inability to stay awake and function normally impacts her daily life and activities.  Labs   Lab Results  Component Value Date   NA 142 10/17/2023   K 4.8 10/17/2023   CO2 25 10/17/2023   GLUCOSE 75 10/17/2023   BUN 12 10/17/2023   CREATININE 0.96 10/17/2023   CALCIUM 10.1 10/17/2023   GFR 74.57 11/25/2014   EGFR 70 10/17/2023   GFRNONAA >60 11/20/2021      Latest Ref Rng & Units 10/17/2023    3:05 PM 08/16/2023   11:44 AM 11/20/2021    4:32 PM  BMP  Glucose 70 - 99 mg/dL 75  96  865    BUN 6 - 24 mg/dL 12  13  12    Creatinine 0.57 - 1.00 mg/dL 7.84  6.96  2.95   BUN/Creat Ratio 9 - 23 13  SEE NOTE:    Sodium 134 - 144 mmol/L 142  139  140   Potassium 3.5 - 5.2 mmol/L 4.8  4.4  3.7   Chloride 96 - 106 mmol/L 102  104  106   CO2 20 - 29 mmol/L 25  26  26    Calcium 8.7 - 10.2 mg/dL 28.4  9.8  9.5       Latest Ref Rng & Units 08/16/2023   11:44 AM 11/20/2021    4:32 PM 10/30/2020   11:36 AM  CBC  WBC 3.8 - 10.8 Thousand/uL 6.0  19.3    Hemoglobin 11.7 - 15.5 g/dL 13.2  44.0  10.2   Hematocrit 35.0 - 45.0 % 42.6  40.9  44.0   Platelets 140 - 400 Thousand/uL 350  301     No results found for: "HGBA1C"  Lab Results  Component Value Date   TSH 1.28 11/25/2014     ROS  Review of Systems  Cardiovascular:  Positive for irregular heartbeat. Negative for chest pain, dyspnea on exertion and leg swelling.   Physical Exam:   VS:  BP 128/80   Pulse 94   Ht 5\' 8"  (1.727 m)   Wt 173 lb (78.5 kg)   LMP  (LMP Unknown)   SpO2 97%   BMI 26.30 kg/m  Wt Readings from Last 3 Encounters:  11/28/23 173 lb (78.5 kg)  09/28/23 173 lb (78.5 kg)  09/12/23 176 lb (79.8 kg)    Physical Exam Neck:     Vascular: No carotid bruit or JVD.  Cardiovascular:     Rate and Rhythm: Normal rate and regular rhythm.     Heart sounds: Normal heart sounds. No murmur heard.    No gallop.  Pulmonary:     Effort: Pulmonary effort is normal.     Breath sounds: Normal breath sounds.  Abdominal:     General: Bowel sounds are normal.     Palpations: Abdomen is soft.  Musculoskeletal:     Right lower leg: No edema.     Left lower leg: No edema.    Studies Reviewed: Aaron Aas    Coronary CTA discussed  EKG:         Medications and allergies    Allergies  Allergen Reactions   Statins Other (See Comments)    Rhabdomyolysis   Meperidine Hcl Other (See Comments)    Severe headache   Lactose Intolerance (Gi)     Upset stomach    Codeine Nausea Only    TAKES ZOFRAN  WITH FOR NAUSEA      Current Outpatient Medications:    acetaminophen  (TYLENOL ) 325 MG tablet, Take 650 mg by mouth every 6 (six) hours as needed., Disp: , Rfl:    albuterol (VENTOLIN HFA) 108 (90 Base) MCG/ACT inhaler, Inhale 1-2 puffs into the lungs every 4 (four) hours as needed for wheezing or shortness of breath., Disp: , Rfl:    diazepam  (VALIUM ) 2 MG tablet, Take 1/2 - 1 tablet by mouth twice daily as needed for panic or PTSD symptoms, Disp: 30 tablet, Rfl: 1   escitalopram  (LEXAPRO ) 20 MG tablet, Take 1 tablet (20 mg total) by mouth daily., Disp: 90 tablet, Rfl: 1   fluticasone  (FLONASE ) 50 MCG/ACT nasal spray, Place 1 spray into both nostrils daily., Disp: 16 g, Rfl: 0   ibuprofen (ADVIL) 200 MG tablet, Take 200 mg by mouth every 6 (six) hours as needed., Disp: , Rfl:    lactase (LACTAID) 3000 UNITS tablet, Take 9,000 Units by mouth 3 (three) times daily with meals., Disp: , Rfl:    meloxicam  (MOBIC ) 7.5 MG tablet, Take 1 tablet (7.5 mg total) by mouth 2 (two) times daily., Disp: 60 tablet, Rfl: 0   methylphenidate  (RITALIN ) 5 MG tablet, Take 1 tablet (5 mg total) by mouth 3 (three) times daily., Disp: 90 tablet, Rfl: 0   ondansetron  (ZOFRAN -ODT) 4 MG disintegrating tablet, Take 1 tablet (4 mg total) by mouth every 8 (eight) hours as needed for nausea or vomiting., Disp: 20 tablet, Rfl: 0   tiZANidine  (ZANAFLEX ) 4 MG tablet, Take 1 tablet (4 mg total) by mouth 3 (three) times daily., Disp: 30 tablet, Rfl: 0   traMADol  (ULTRAM ) 50 MG tablet, Take 1 tablet (50 mg total) by mouth every 6 (six) hours as needed., Disp: 40 tablet, Rfl: 0   metoprolol tartrate (LOPRESSOR) 25 MG tablet, Take 25 mg by mouth daily. (Patient not taking: Reported on 11/28/2023), Disp: , Rfl:    No orders of the defined types were placed in this encounter.    Medications Discontinued During This Encounter  Medication Reason   diazepam  (VALIUM ) 2 MG tablet Duplicate   escitalopram  (LEXAPRO ) 20 MG tablet Duplicate   ketorolac   (TORADOL ) 10 MG tablet Patient Preference   methylphenidate  (RITALIN ) 5 MG tablet Duplicate   methylphenidate  (RITALIN )  5 MG tablet Duplicate   propranolol  (INDERAL ) 20 MG tablet Duplicate     ASSESSMENT AND PLAN: .      ICD-10-CM   1. Palpitations  R00.2     2. Sleep disturbance  G47.9 Ambulatory referral to Sleep Studies    3. Primary sleep apnea of newborn, unspecified type  P28.30 Ambulatory referral to Sleep Studies     Orders Placed This Encounter  Procedures   Ambulatory referral to Sleep Studies    Referral Priority:   Urgent    Referral Type:   Consultation    Referral Reason:   Specialty Services Required    Referred to Provider:   Neomia Banner, MD    Number of Visits Requested:   1    Assessment and Plan Assessment & Plan Complex sleep apnea   Diagnosed in 2007, she experiences excessive daytime sleepiness, inability to stay awake, and excessive sleep duration. Anxiety and sleep disturbances may contribute to palpitations. A previous sleep study confirmed complex sleep apnea, and narcolepsy was suspected but not confirmed. Send an urgent referral for a sleep study with emphasis on testing for narcolepsy. Ensure the sleep specialist reviews the 2007 sleep study and orders appropriate tests.  Suspected narcolepsy   Excessive daytime sleepiness and inability to stay awake persist despite methylphenidate  use. A previous sleep study suggested possible narcolepsy, but further testing was not conducted. Include narcolepsy testing in the sleep study referral.  Supraventricular tachycardia (SVT)/brief atrial tachycardia She experiences palpitations and irregular heart rhythms, exacerbated by anxiety and sleep disturbances. Previous monitoring showed brief episodes of SVT, which are life-altering but not life-threatening. Coronary CT angiogram and echocardiogram were normal. Continue metoprolol as needed for palpitations. Address sleep disturbances to help reduce palpitations.   Continue metoprolol on a as needed basis as needed.  Anxiety disorder   Anxiety contributes to sleep disturbances and palpitations, affecting social interactions and daily functioning.  From cardiac standpoint she remains stable, I will see her back on a as needed basis.   Signed,  Knox Perl, MD, [facility] 11/28/2023, 5:44 PM [facility] [address] [place], [place] [address] Phone: [phone]. Fax:  [phone]

## 2023-11-30 ENCOUNTER — Ambulatory Visit (INDEPENDENT_AMBULATORY_CARE_PROVIDER_SITE_OTHER): Payer: 59 | Admitting: Psychiatry

## 2023-11-30 DIAGNOSIS — F411 Generalized anxiety disorder: Secondary | ICD-10-CM | POA: Diagnosis not present

## 2023-11-30 NOTE — Progress Notes (Unsigned)
      [facility] Counselor/Therapist Progress Note  Patient ID: Shannon Swanson, MRN: [id number],    Date: 11/30/2023  Time Spent: 47 minutes start time 10:13 AM end time 11:00 AM  Treatment Type: Individual Therapy  Reported Symptoms: anxiety, rumination, focusing issues, sadness, pain issues, panic, fatigue, sleep issues, nightmares, flashbacks, memory issues  Mental Status Exam:  Appearance:   Well Groomed     Behavior:  Appropriate  Motor:  Restlestness  Speech/Language:   Normal Rate  Affect:  Appropriate  Mood:  anxious  Thought process:  normal  Thought content:    WNL  Sensory/Perceptual disturbances:    pain  Orientation:  oriented to person, place, time/date, and situation  Attention:  Good  Concentration:  Good  Memory:  Immediate;   Fair  Fund of knowledge:   Good  Insight:    Good  Judgment:   Good  Impulse Control:  Good   Risk Assessment: Danger to Self:  No Self-injurious Behavior: No Danger to Others: No Duty to Warn:no Physical Aggression / Violence:No  Access to Firearms a concern: No  Gang Involvement:No   Subjective: Patient was present for session. She shared that she is going to fly to [place]  after session and she is feel lots of anxiety but talking herself through that anxiety.  She shared she is struggling with fatigue. She did see the cardiologist and he released her even though she is having irregular rhythms still. She went on to explain that she has had an episode of having a frightening picture prior to falling a sleep and so she tried to stay awake, she couldn't remember what the picture was exactly. She shared she wanted to work on difficulty not getting on the plane.   Interventions: Cognitive Behavioral Therapy, Dialectical Behavioral Therapy, and Solution-Oriented/Positive Psychology  Diagnosis:   ICD-10-CM   1. Generalized anxiety disorder  F41.1       Plan:  Patient is to use CBT and coping skills to decrease triggered  responses.  Patient is to work on following plans from Restaurant manager, fast food discussed, work on listening to positive podcast and music regularly, and work on giving herself 5-minute tasks to do throughout the day when she feels capable.  Patient is to work on journaling symptoms feelings and triggers.  Patient is to work on finding things that give her life purpose.  Patient is to work on breaking things up into small pieces that she feels like she can accomplish.  Patient is to work on thinking of positives and affirming herself.  Patient is to look at podcast by Dr. Evlyn Hoffmann on neuro cycle changes.  Patient is to work on reminding herself that she is enough and still has a purpose even when she is unable to work.  She is to try and focus on concrete things that she can still do.  Patient is to continue working with providers on her medical issues.  Patient is to take medication as directed   Marlise Simpers, LCMHC

## 2023-12-21 ENCOUNTER — Ambulatory Visit: Admitting: Psychiatry

## 2023-12-21 ENCOUNTER — Other Ambulatory Visit (HOSPITAL_COMMUNITY): Payer: Self-pay

## 2023-12-21 DIAGNOSIS — F411 Generalized anxiety disorder: Secondary | ICD-10-CM

## 2023-12-21 NOTE — Progress Notes (Signed)
 [facility] Counselor/Therapist Progress Note  Patient ID: Shannon Swanson, MRN: [id number],    Date: 12/21/2023  Time Spent: 50 minutes start time 3:07 PM end time 3:57 PM  Treatment Type: Individual Therapy  Reported Symptoms: anxiety, rumination, pain issues, triggered responses, sadness, nightmares, startle response, panic, focusing issues, memory issues  Mental Status Exam:  Appearance:   Well Groomed     Behavior:  Appropriate  Motor:  Normal  Speech/Language:   Normal Rate  Affect:  Appropriate and Tearful  Mood:  anxious and sad  Thought process:  normal  Thought content:    WNL  Sensory/Perceptual disturbances:    Pain issues  Orientation:  oriented to person, place, time/date, and situation  Attention:  Good  Concentration:  Good  Memory:  fair  Fund of knowledge:   Good  Insight:    Good  Judgment:   Good  Impulse Control:  Good   Risk Assessment: Danger to Self:  No Self-injurious Behavior: No Danger to Others: No Duty to Warn:no Physical Aggression / Violence:No  Access to Firearms a concern: No  Gang Involvement:No   Subjective: Patient was present for session. She shared she had made it through the trip and it well overall. Her neck did not respond well but she is working through it. She is still working to manage her heart issues. She  was upset at the beginning of session due to not being able to work and help others.She is scheduled to meet with the sleep specialist in July. She shared she had to get things together for her disability and she couldn't focus and recall what sh needed to get it completed. She finally had to go to her sister's to get her help to get it completed. She shared that they were up until 1:00 AM doing it.Encouraged her to feel good about getting it completed.  Patient was tearful as she expressed concern about being a burden to everybody because of her struggles.  Encouraged her to recognize that people are glad to help her  because of what she has done for others.  Patient was discussing her feelings and emotions and she was encouraged to think back to when she first recognized that she wants to help others.  She shared that she remembered her dad being proud of her as a child when she was kind and helpful to others that he told her even as he was not doing well in the hospital that she had a gift and needed to help others.  Encouraged her to remember she is not disappointing her father just because she is not able to work the way she used to and that she has got to remind herself that he is still proud of her even though she is not working currently.  Encouraged her to find other ways that she can make kind to others.  Had patient do a mindfulness exercise in session and encouraged her to work on focusing on recognizing and observing her feelings but not becoming her feelings and emotions.  Interventions: Cognitive Behavioral Therapy, Mindfulness Meditation, and Insight-Oriented  Diagnosis:   ICD-10-CM   1. Generalized anxiety disorder  F41.1       Plan:  Patient is to use CBT and coping skills to decrease triggered responses.  Patient is to work on reminding herself that she needs to observe her feelings but not being her feelings and doing some mindfulness exercises.  Patient is to work on journaling symptoms  feelings and triggers.  Patient is to work on finding things that give her life purpose.  Patient is to work on breaking things up into small pieces that she feels like she can accomplish.  Patient is to work on thinking of positives and affirming herself.  Patient is to look at podcast by Dr. Evlyn Hoffmann on neuro cycle changes.  Patient is to work on reminding herself that she is enough and still has a purpose even when she is unable to work.  She is to try and focus on concrete things that she can still do.  Patient is to continue working with providers on her medical issues.  Patient is to take medication as  directed   Marlise Simpers, LCMHC

## 2024-01-11 ENCOUNTER — Ambulatory Visit: Admitting: Psychiatry

## 2024-01-17 ENCOUNTER — Other Ambulatory Visit: Payer: Self-pay

## 2024-01-17 ENCOUNTER — Ambulatory Visit: Admitting: Neurology

## 2024-01-17 ENCOUNTER — Other Ambulatory Visit (INDEPENDENT_AMBULATORY_CARE_PROVIDER_SITE_OTHER): Payer: Self-pay

## 2024-01-17 ENCOUNTER — Other Ambulatory Visit (HOSPITAL_COMMUNITY): Payer: Self-pay

## 2024-01-17 ENCOUNTER — Encounter: Payer: Self-pay | Admitting: Neurology

## 2024-01-17 VITALS — BP 122/84 | HR 78 | Ht 68.0 in | Wt 171.8 lb

## 2024-01-17 DIAGNOSIS — F4310 Post-traumatic stress disorder, unspecified: Secondary | ICD-10-CM

## 2024-01-17 DIAGNOSIS — R0683 Snoring: Secondary | ICD-10-CM | POA: Diagnosis not present

## 2024-01-17 DIAGNOSIS — Z9189 Other specified personal risk factors, not elsewhere classified: Secondary | ICD-10-CM | POA: Insufficient documentation

## 2024-01-17 DIAGNOSIS — I82722 Chronic embolism and thrombosis of deep veins of left upper extremity: Secondary | ICD-10-CM | POA: Diagnosis not present

## 2024-01-17 DIAGNOSIS — G47411 Narcolepsy with cataplexy: Secondary | ICD-10-CM | POA: Diagnosis not present

## 2024-01-17 DIAGNOSIS — M4802 Spinal stenosis, cervical region: Secondary | ICD-10-CM

## 2024-01-17 DIAGNOSIS — G4719 Other hypersomnia: Secondary | ICD-10-CM | POA: Insufficient documentation

## 2024-01-17 DIAGNOSIS — Z0289 Encounter for other administrative examinations: Secondary | ICD-10-CM

## 2024-01-17 DIAGNOSIS — Z789 Other specified health status: Secondary | ICD-10-CM | POA: Insufficient documentation

## 2024-01-17 DIAGNOSIS — F900 Attention-deficit hyperactivity disorder, predominantly inattentive type: Secondary | ICD-10-CM

## 2024-01-17 MED ORDER — MELOXICAM 7.5 MG PO TABS
7.5000 mg | ORAL_TABLET | Freq: Two times a day (BID) | ORAL | 0 refills | Status: DC
  Filled 2024-01-17: qty 60, 30d supply, fill #0

## 2024-01-17 MED ORDER — METHYLPHENIDATE HCL 5 MG PO TABS
5.0000 mg | ORAL_TABLET | Freq: Three times a day (TID) | ORAL | 0 refills | Status: AC
  Filled 2024-01-17: qty 90, 30d supply, fill #0

## 2024-01-17 MED ORDER — TIZANIDINE HCL 4 MG PO TABS
4.0000 mg | ORAL_TABLET | Freq: Three times a day (TID) | ORAL | 0 refills | Status: DC
  Filled 2024-01-17: qty 30, 10d supply, fill #0
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
## 2024-01-18 ENCOUNTER — Other Ambulatory Visit (HOSPITAL_COMMUNITY): Payer: Self-pay

## 2024-01-19 ENCOUNTER — Other Ambulatory Visit (HOSPITAL_COMMUNITY): Payer: Self-pay

## 2024-01-26 ENCOUNTER — Telehealth: Payer: Self-pay | Admitting: Neurology

## 2024-01-26 DIAGNOSIS — Z789 Other specified health status: Secondary | ICD-10-CM

## 2024-01-26 DIAGNOSIS — G4719 Other hypersomnia: Secondary | ICD-10-CM

## 2024-01-26 NOTE — Telephone Encounter (Signed)
 NPSG [employer] pending

## 2024-01-29 ENCOUNTER — Ambulatory Visit: Payer: Self-pay | Admitting: Neurology

## 2024-01-29 LAB — NARCOLEPSY EVALUATION
DQA1*01:02: POSITIVE
DQB1*06:02: POSITIVE

## 2024-01-31 ENCOUNTER — Ambulatory Visit: Admitting: Psychiatry

## 2024-01-31 NOTE — Telephone Encounter (Signed)
 [employer] denied the NPSG do you want to do an appeal or order a HST?

## 2024-01-31 NOTE — Progress Notes (Signed)
 Tried to call patient. Did not get to speak to her. Will try again later.

## 2024-02-01 ENCOUNTER — Other Ambulatory Visit: Payer: Self-pay | Admitting: Neurology

## 2024-02-01 ENCOUNTER — Ambulatory Visit (INDEPENDENT_AMBULATORY_CARE_PROVIDER_SITE_OTHER): Admitting: Psychiatry

## 2024-02-01 ENCOUNTER — Encounter: Payer: Self-pay | Admitting: Psychiatry

## 2024-02-01 DIAGNOSIS — F411 Generalized anxiety disorder: Secondary | ICD-10-CM

## 2024-02-01 DIAGNOSIS — G4710 Hypersomnia, unspecified: Secondary | ICD-10-CM

## 2024-02-01 DIAGNOSIS — G47419 Narcolepsy without cataplexy: Secondary | ICD-10-CM | POA: Insufficient documentation

## 2024-02-01 NOTE — Progress Notes (Signed)
 Has to do HST first, ordered.

## 2024-02-01 NOTE — Telephone Encounter (Signed)
 I am happy to put the patiernt through a HST in order to document that no other sleep disorder is present, or no untreated sleep disorder is present  then will still need PSG and MSLT to follow.

## 2024-02-01 NOTE — Progress Notes (Signed)
 [facility] Counselor/Therapist Progress Note  Patient ID: Shannon Swanson, MRN: [id number],    Date: 02/01/2024  Time Spent: 50 minutes start time 9:13 AM end time 10:03 AM  Treatment Type: Individual Therapy  Reported Symptoms: anxiety, sleep issues, pain issues, sadness, panic, fatigue, triggered responses  Mental Status Exam:  Appearance:   Casual     Behavior:  Appropriate  Motor:  Normal  Speech/Language:   Normal Rate  Affect:  Appropriate  Mood:  anxious and sad  Thought process:  normal  Thought content:    WNL  Sensory/Perceptual disturbances:    Pain had to stand up and move in session when pain was too much  Orientation:  oriented to person, place, time/date, and situation  Attention:  Good  Concentration:  Good  Memory:  WNL  Fund of knowledge:   Good  Insight:    Good  Judgment:   Good  Impulse Control:  Good   Risk Assessment: Danger to Self:  No Self-injurious Behavior: No Danger to Others: No Duty to Warn:no Physical Aggression / Violence:No  Access to Firearms a concern: No  Gang Involvement:No   Subjective: Patient was present for session. She shared her pain has increased due to something shifting in her neck.  She shared she is staying busier with family recently but that has increased her pain levels. She has had a few times when she has had panic and gone back to bed. She is working with a neurologist about her sleep issues. She was tested for the narcolepsy gene and it came back positive and she has all of the symptoms. She is going to get a sleep study next. She shared that getting the information helped her feel more hopeful that something can help her.  Patient shared that she was still very frustrated with the whole situation and ready to get back to feeling like herself but she is realizing that will never happen and that is very difficult.  Patient did get information that her disability seems to be moving in a positive direction which is  appropriate especially with the new issue of narcolepsy.  Encouraged patient to try and consider that a good thing even though it is still not what she wants which is to be back to normal and feeling that she can manage things on her own.  Patient was encouraged to take time to accomplish small tasks and take time to rest and accomplish small task and continue to breaking things down into more manageable pieces and working on her positive affirmations.  Interventions: Cognitive Behavioral Therapy and Solution-Oriented/Positive Psychology  Diagnosis:   ICD-10-CM   1. Generalized anxiety disorder  F41.1       Plan:   Patient is to use CBT and coping skills to decrease triggered responses.  Patient is to work on reminding herself that she needs to observe her feelings but not being her feelings and doing some mindfulness exercises.  Patient is to work on journaling symptoms feelings and triggers.  Patient is to work on finding things that give her life purpose.  Patient is to work on breaking things up into small pieces that she feels like she can accomplish.  Patient is to work on thinking of positives and affirming herself.  Patient is to look at podcast by Dr. Aleck favorite on neuro cycle changes.  Patient is to work on reminding herself that she is enough and still has a purpose even when she is unable  to work.  She is to try and focus on concrete things that she can still do.  Patient is to continue working with providers on her medical issues.  Patient is to take medication as directed   Silvano Pacini, LCMHC

## 2024-02-01 NOTE — Addendum Note (Signed)
 Addended by: CHALICE SAUNAS on: 02/01/2024 05:30 PM   Modules accepted: Orders

## 2024-02-02 NOTE — Telephone Encounter (Signed)
Noted, thank you. HST- [employer] no auth req.  

## 2024-02-06 ENCOUNTER — Other Ambulatory Visit (HOSPITAL_COMMUNITY): Payer: Self-pay

## 2024-02-06 DIAGNOSIS — F339 Major depressive disorder, recurrent, unspecified: Secondary | ICD-10-CM | POA: Diagnosis not present

## 2024-02-06 DIAGNOSIS — F4310 Post-traumatic stress disorder, unspecified: Secondary | ICD-10-CM | POA: Diagnosis not present

## 2024-02-06 DIAGNOSIS — F411 Generalized anxiety disorder: Secondary | ICD-10-CM | POA: Diagnosis not present

## 2024-02-06 MED ORDER — METHYLPHENIDATE HCL 5 MG PO TABS
5.0000 mg | ORAL_TABLET | Freq: Three times a day (TID) | ORAL | 0 refills | Status: AC
  Filled 2024-07-10: qty 90, 30d supply, fill #0

## 2024-02-06 MED ORDER — METHYLPHENIDATE HCL 5 MG PO TABS: 5.0000 mg | ORAL_TABLET | Freq: Three times a day (TID) | ORAL | 0 refills | Status: AC

## 2024-02-06 MED ORDER — METHYLPHENIDATE HCL 5 MG PO TABS
5.0000 mg | ORAL_TABLET | Freq: Three times a day (TID) | ORAL | 0 refills | Status: AC
  Filled 2024-04-16: qty 90, 30d supply, fill #0

## 2024-02-13 ENCOUNTER — Ambulatory Visit (INDEPENDENT_AMBULATORY_CARE_PROVIDER_SITE_OTHER): Admitting: Neurology

## 2024-02-13 DIAGNOSIS — F411 Generalized anxiety disorder: Secondary | ICD-10-CM

## 2024-02-13 DIAGNOSIS — G4710 Hypersomnia, unspecified: Secondary | ICD-10-CM | POA: Diagnosis not present

## 2024-02-13 DIAGNOSIS — G4719 Other hypersomnia: Secondary | ICD-10-CM

## 2024-02-13 DIAGNOSIS — Z789 Other specified health status: Secondary | ICD-10-CM

## 2024-02-13 DIAGNOSIS — G47419 Narcolepsy without cataplexy: Secondary | ICD-10-CM

## 2024-02-13 DIAGNOSIS — Z9189 Other specified personal risk factors, not elsewhere classified: Secondary | ICD-10-CM

## 2024-02-13 DIAGNOSIS — F4310 Post-traumatic stress disorder, unspecified: Secondary | ICD-10-CM

## 2024-02-15 NOTE — Progress Notes (Unsigned)
 [name]

## 2024-02-16 ENCOUNTER — Ambulatory Visit: Payer: Self-pay | Admitting: Neurology

## 2024-02-16 DIAGNOSIS — G4710 Hypersomnia, unspecified: Secondary | ICD-10-CM

## 2024-02-16 DIAGNOSIS — M4802 Spinal stenosis, cervical region: Secondary | ICD-10-CM

## 2024-02-16 DIAGNOSIS — Z789 Other specified health status: Secondary | ICD-10-CM

## 2024-02-16 DIAGNOSIS — Z9189 Other specified personal risk factors, not elsewhere classified: Secondary | ICD-10-CM

## 2024-02-16 DIAGNOSIS — R0683 Snoring: Secondary | ICD-10-CM

## 2024-02-16 DIAGNOSIS — G4719 Other hypersomnia: Secondary | ICD-10-CM

## 2024-02-16 DIAGNOSIS — G47419 Narcolepsy without cataplexy: Secondary | ICD-10-CM
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
## 2024-02-19 NOTE — Telephone Encounter (Signed)
-----   Message from Sylvester Dohmeier sent at 02/16/2024  4:45 PM EDT ----- This HST confirms the presence of obstructive sleep apnea at a moderate degree.  This is also not a REM sleep dependent apnea and it should be possible to treat this patient with a dental device and  positional changes. ( Given the patients concern about CPAP therapy )   The dx allows for the use of  medication that improve daytime alertness. I can offer Armodafinil  as a daytime stimulant - but she needs to clear that with her psychologist.  I will be happy to refer to Sleep dentistry .  Mrs Tardiff  had no significant sleep apnea while she is sleeping on her sides, so avoiding supine sleep is in itself her therapy. The problem is her neck pain-  I will ask the patient to speak to  her neurosurgeon about adjunct pain therapy by PT, message, or injections, all non -surgical means.    I base my recommendation on her previous intolerance of CPAP therapy and on her expressed reservation about resuming that therapy.   I like to follow up after the above steps have been taken and review the resulting ESS score and AHI.     ----- Message ----- From: Chalice Saunas, MD Sent: 02/16/2024   4:29 PM EDT To: Saunas Chalice, MD

## 2024-02-19 NOTE — Telephone Encounter (Signed)
 Called the patient and reviewed in detail the sleep study results. She would prefer dental device route considering she has tried CPAP before and didn't do well with that. I advised I will send the referral to dental device. The patient had narcolepsy genetic screening completed which was positive explained that she will have to treat the sleep apnea first before being able to complete further narcolepsy work up.  Advised Dr Dohmeier states she would recommend armodafinil but she should discus with psychiatrist and cardiology first.

## 2024-02-20 ENCOUNTER — Telehealth: Payer: Self-pay | Admitting: Neurology

## 2024-02-20 NOTE — Telephone Encounter (Signed)
 Referral for Dentistry faxed to [facility] Phone [phone] FAX3 [phone]

## 2024-02-21 ENCOUNTER — Ambulatory Visit (INDEPENDENT_AMBULATORY_CARE_PROVIDER_SITE_OTHER): Admitting: Psychiatry

## 2024-02-21 ENCOUNTER — Other Ambulatory Visit (HOSPITAL_COMMUNITY): Payer: Self-pay

## 2024-02-21 DIAGNOSIS — F411 Generalized anxiety disorder: Secondary | ICD-10-CM | POA: Diagnosis not present

## 2024-02-21 NOTE — Progress Notes (Unsigned)
      [facility] Counselor/Therapist Progress Note  Patient ID: Shannon Swanson, MRN: [id number],    Date: 02/21/2024  Time Spent: *** start time 3:05 PM end time 3:57 PM  Treatment Type: Individual Therapy  Reported Symptoms: anxiety, triggered responses, sadness, panic, stomach issues  Mental Status Exam:  Appearance:   Well Groomed     Behavior:  Appropriate  Motor:  Normal  Speech/Language:   Normal Rate  Affect:  Appropriate  Mood:  anxious  Thought process:  normal  Thought content:    WNL  Sensory/Perceptual disturbances:    WNL  Orientation:  oriented to person, place, time/date, and situation  Attention:  Good  Concentration:  Good  Memory:  WNL  Fund of knowledge:   Good  Insight:    Good  Judgment:   Good  Impulse Control:  Good   Risk Assessment: Danger to Self:  No Self-injurious Behavior: No Danger to Others: No Duty to Warn:no Physical Aggression / Violence:No  Access to Firearms a concern: No  Gang Involvement:No   Subjective: Patient was present for session. She shared she was upset because she had so much to do and she started to panic. She was hit by nausea that was overwhelming for her. She shared she had to take a PRN med to get through it all.  She shared her daughter is coming from [place]  and she is excited but it causes her anxiety. Her grandson may be on the spectrum.  Interventions: {PSY:[id number]}  Diagnosis:   ICD-10-CM   1. Generalized anxiety disorder  F41.1       Plan:  Patient is to use CBT and coping skills to decrease triggered responses.  Patient is to work on reminding herself that she needs to observe her feelings but not being her feelings and doing some mindfulness exercises.  Patient is to work on journaling symptoms feelings and triggers.  Patient is to work on finding things that give her life purpose.  Patient is to work on breaking things up into small pieces that she feels like she can accomplish.  Patient is to work  on thinking of positives and affirming herself.  Patient is to look at podcast by Dr. Aleck favorite on neuro cycle changes.  Patient is to work on reminding herself that she is enough and still has a purpose even when she is unable to work.  She is to try and focus on concrete things that she can still do.  Patient is to continue working with providers on her medical issues.  Patient is to take medication as directed   Silvano Pacini, LCMHC
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
## 2024-02-29 ENCOUNTER — Other Ambulatory Visit (HOSPITAL_BASED_OUTPATIENT_CLINIC_OR_DEPARTMENT_OTHER): Payer: Self-pay

## 2024-02-29 ENCOUNTER — Other Ambulatory Visit (HOSPITAL_COMMUNITY): Payer: Self-pay

## 2024-02-29 MED ORDER — ARMODAFINIL 200 MG PO TABS
200.0000 mg | ORAL_TABLET | Freq: Every day | ORAL | 5 refills | Status: AC
  Filled 2024-02-29 – 2024-03-01 (×2): qty 30, 30d supply, fill #0
  Filled 2024-04-16: qty 30, 30d supply, fill #1
  Filled 2024-05-23 (×3): qty 30, 30d supply, fill #2

## 2024-02-29 NOTE — Addendum Note (Signed)
 Addended by: CHALICE SAUNAS on: 02/29/2024 04:44 PM   Modules accepted: Orders

## 2024-03-01 ENCOUNTER — Telehealth (HOSPITAL_COMMUNITY): Payer: Self-pay

## 2024-03-01 ENCOUNTER — Other Ambulatory Visit (HOSPITAL_COMMUNITY): Payer: Self-pay

## 2024-03-01 ENCOUNTER — Encounter (HOSPITAL_COMMUNITY): Payer: Self-pay

## 2024-03-01 ENCOUNTER — Telehealth: Payer: Self-pay

## 2024-03-01 NOTE — Telephone Encounter (Signed)
 Pharmacy Patient Advocate Encounter  Received notification from [facility] that Prior Authorization for Armodafinil  has been APPROVED from 03/01/2024 to 03/01/2025. Ran test claim, Copay is $15.00. This test claim was processed through [facility]- copay amounts may vary at other pharmacies due to pharmacy/plan contracts, or as the patient moves through the different stages of their insurance plan.   PA #/Case ID/Reference #: [id number]  KEY: [id number]

## 2024-03-01 NOTE — Telephone Encounter (Signed)
 PA has been approved

## 2024-03-01 NOTE — Telephone Encounter (Signed)
 Pharmacy Patient Advocate Encounter   Received notification from Patient Pharmacy that prior authorization for Armodafinil  200mg  Tablets is required/requested.   Insurance verification completed.   The patient is insured through [facility] .   Per test claim: PA required; PA submitted to above mentioned insurance via Latent Key/confirmation #/[facility] Status is pending

## 2024-03-01 NOTE — Telephone Encounter (Signed)
PA has been submitted awaiting determination

## 2024-03-05 NOTE — Telephone Encounter (Signed)
 She is scheduled for 03/19/24 at 10:30am.

## 2024-03-19 ENCOUNTER — Other Ambulatory Visit: Payer: Self-pay

## 2024-03-20 ENCOUNTER — Ambulatory Visit: Admitting: Psychiatry

## 2024-04-02 DIAGNOSIS — G4733 Obstructive sleep apnea (adult) (pediatric): Secondary | ICD-10-CM | POA: Diagnosis not present

## 2024-04-04 ENCOUNTER — Other Ambulatory Visit (HOSPITAL_COMMUNITY): Payer: Self-pay

## 2024-04-04 DIAGNOSIS — F909 Attention-deficit hyperactivity disorder, unspecified type: Secondary | ICD-10-CM | POA: Diagnosis not present

## 2024-04-04 DIAGNOSIS — F411 Generalized anxiety disorder: Secondary | ICD-10-CM | POA: Diagnosis not present

## 2024-04-04 DIAGNOSIS — F4310 Post-traumatic stress disorder, unspecified: Secondary | ICD-10-CM | POA: Diagnosis not present

## 2024-04-04 DIAGNOSIS — F339 Major depressive disorder, recurrent, unspecified: Secondary | ICD-10-CM | POA: Diagnosis not present

## 2024-04-04 MED ORDER — ESCITALOPRAM OXALATE 20 MG PO TABS
20.0000 mg | ORAL_TABLET | Freq: Every day | ORAL | 1 refills | Status: AC
  Filled 2024-04-04: qty 30, 30d supply, fill #0
  Filled 2024-04-04 – 2024-04-16 (×2): qty 90, 90d supply, fill #0
  Filled 2024-07-10: qty 90, 90d supply, fill #1

## 2024-04-15 ENCOUNTER — Other Ambulatory Visit (HOSPITAL_COMMUNITY): Payer: Self-pay

## 2024-04-16 ENCOUNTER — Other Ambulatory Visit (HOSPITAL_COMMUNITY): Payer: Self-pay

## 2024-04-16 ENCOUNTER — Other Ambulatory Visit: Payer: Self-pay | Admitting: Psychiatry

## 2024-04-16 ENCOUNTER — Other Ambulatory Visit: Payer: Self-pay

## 2024-04-16 DIAGNOSIS — F411 Generalized anxiety disorder: Secondary | ICD-10-CM

## 2024-04-16 DIAGNOSIS — F422 Mixed obsessional thoughts and acts: Secondary | ICD-10-CM

## 2024-04-16 MED ORDER — MELOXICAM 7.5 MG PO TABS
7.5000 mg | ORAL_TABLET | Freq: Two times a day (BID) | ORAL | 0 refills | Status: AC
  Filled 2024-04-16: qty 60, 30d supply, fill #0

## 2024-04-16 MED ORDER — TIZANIDINE HCL 4 MG PO TABS
4.0000 mg | ORAL_TABLET | Freq: Three times a day (TID) | ORAL | 0 refills | Status: AC
  Filled 2024-04-16: qty 30, 10d supply, fill #0

## 2024-04-17 ENCOUNTER — Other Ambulatory Visit (HOSPITAL_COMMUNITY): Payer: Self-pay

## 2024-04-18 ENCOUNTER — Other Ambulatory Visit (HOSPITAL_COMMUNITY): Payer: Self-pay

## 2024-04-24 ENCOUNTER — Other Ambulatory Visit (HOSPITAL_COMMUNITY): Payer: Self-pay

## 2024-04-25 ENCOUNTER — Ambulatory Visit (INDEPENDENT_AMBULATORY_CARE_PROVIDER_SITE_OTHER): Admitting: Psychiatry

## 2024-04-25 DIAGNOSIS — F411 Generalized anxiety disorder: Secondary | ICD-10-CM

## 2024-04-25 NOTE — Progress Notes (Signed)
 [facility] Counselor/Therapist Progress Note  Patient ID: Shannon Swanson, MRN: [id number],    Date: 04/25/2024  Time Spent: 50 minutes start time 3:11 PM end time 4:01 PM  Treatment Type: Individual Therapy  Reported Symptoms: health issues, chronic pain issues, anxiety, memory issues, fatigue, sleep isssues  Mental Status Exam:  Appearance:   Well Groomed     Behavior:  Appropriate  Motor:  Normal  Speech/Language:   Normal Rate  Affect:  Appropriate  Mood:  anxious  Thought process:  circumstantial  Thought content:    WNL  Sensory/Perceptual disturbances:    Neck pain  Orientation:  oriented to person, place, time/date, and situation  Attention:  Good  Concentration:  Good  Memory:  fair  Fund of knowledge:   Good  Insight:    Good  Judgment:   Good  Impulse Control:  Good   Risk Assessment: Danger to Self:  No Self-injurious Behavior: No Danger to Others: No Duty to Warn:no Physical Aggression / Violence:No  Access to Firearms a concern: No  Gang Involvement:No   Subjective: Patient was present for session. She shared her neck was in a lot of pain currently. She went on to share she is having a hard time taking her medication as directed due to sleep issues. Discussed getting into a healthy schedule that could help her get into some for of routine for muscle memory.  Patient struggled with thinking through how to make that happen.  She shared that the lack of motivation is huge and the thought that she is on an unhealthy sleep schedule makes things very difficult.  Encouraged patient to focus on trying to figure out ways that she can have her children and her mother help her make sure she gets up in the mornings and get started moving so that she does not just go back to sleep.  Discussed multiple different possibilities and patient was finally able to agree that she would communicate with her sons and her mother about making sure she gets up each day in the  morning around 730-ish so that she can have a plan to get some things accomplished through the day.  She also agreed to start looking into a morning Bible study as well as other activities that she may be able to do in the morning so she has something to look forward to because she did see that when she has something to do her day does get better.  Discussed different ways to talk or self through that and the importance of using her tools to keep her thoughts moving in a positive direction.  Interventions: Cognitive Behavioral Therapy and Solution-Oriented/Positive Psychology  Diagnosis:   ICD-10-CM   1. Generalized anxiety disorder  F41.1       Plan: Patient is to use CBT and coping skills to decrease triggered responses.  Patient is to follow plans from session to work on getting into a healthy routine and finding more activities to do each day so that she can be motivated to get out of the house and develop healthy habits.  Patient is to work on journaling symptoms feelings and triggers.   Patient is to work on thinking of positives and affirming herself.  Patient is to look at podcast by Dr. Aleck favorite on neuro cycle changes.  Patient is to work on reminding herself that she is enough and still has a purpose even when she is unable to work.  She is to try  and focus on concrete things that she can still do.  Patient is to continue working with providers on her medical issues.  Patient is to take medication as directed     Silvano Pacini, LCMHC

## 2024-05-07 ENCOUNTER — Other Ambulatory Visit: Payer: Self-pay

## 2024-05-09 ENCOUNTER — Other Ambulatory Visit (HOSPITAL_COMMUNITY): Payer: Self-pay

## 2024-05-16 ENCOUNTER — Other Ambulatory Visit (HOSPITAL_COMMUNITY): Payer: Self-pay

## 2024-05-16 DIAGNOSIS — F411 Generalized anxiety disorder: Secondary | ICD-10-CM | POA: Diagnosis not present

## 2024-05-16 DIAGNOSIS — F4310 Post-traumatic stress disorder, unspecified: Secondary | ICD-10-CM | POA: Diagnosis not present

## 2024-05-16 DIAGNOSIS — F909 Attention-deficit hyperactivity disorder, unspecified type: Secondary | ICD-10-CM | POA: Diagnosis not present

## 2024-05-16 DIAGNOSIS — F332 Major depressive disorder, recurrent severe without psychotic features: Secondary | ICD-10-CM | POA: Diagnosis not present

## 2024-05-16 MED ORDER — DRIZALMA SPRINKLE 20 MG PO CSDR
20.0000 mg | DELAYED_RELEASE_CAPSULE | Freq: Every morning | ORAL | 1 refills | Status: DC
  Filled 2024-05-16: qty 30, 30d supply, fill #0
  Filled 2024-07-10: qty 30, 30d supply, fill #1

## 2024-05-16 MED ORDER — DIAZEPAM 2 MG PO TABS
2.0000 mg | ORAL_TABLET | Freq: Every day | ORAL | 3 refills | Status: AC | PRN
  Filled 2024-05-16: qty 30, 30d supply, fill #0
  Filled 2024-07-10: qty 30, 30d supply, fill #1

## 2024-05-16 MED ORDER — METHYLPHENIDATE HCL 5 MG PO TABS
5.0000 mg | ORAL_TABLET | Freq: Three times a day (TID) | ORAL | 0 refills | Status: DC
  Filled 2024-05-16: qty 90, 30d supply, fill #0

## 2024-05-17 ENCOUNTER — Other Ambulatory Visit (HOSPITAL_COMMUNITY): Payer: Self-pay

## 2024-05-17 DIAGNOSIS — Z6826 Body mass index (BMI) 26.0-26.9, adult: Secondary | ICD-10-CM | POA: Diagnosis not present

## 2024-05-17 DIAGNOSIS — Z780 Asymptomatic menopausal state: Secondary | ICD-10-CM | POA: Diagnosis not present

## 2024-05-17 DIAGNOSIS — Z1211 Encounter for screening for malignant neoplasm of colon: Secondary | ICD-10-CM | POA: Diagnosis not present

## 2024-05-17 DIAGNOSIS — Z1231 Encounter for screening mammogram for malignant neoplasm of breast: Secondary | ICD-10-CM | POA: Diagnosis not present

## 2024-05-20 ENCOUNTER — Other Ambulatory Visit (HOSPITAL_COMMUNITY): Payer: Self-pay

## 2024-05-21 ENCOUNTER — Other Ambulatory Visit (HOSPITAL_COMMUNITY): Payer: Self-pay

## 2024-05-22 ENCOUNTER — Other Ambulatory Visit (HOSPITAL_COMMUNITY): Payer: Self-pay

## 2024-05-23 ENCOUNTER — Other Ambulatory Visit (HOSPITAL_COMMUNITY): Payer: Self-pay

## 2024-05-23 ENCOUNTER — Other Ambulatory Visit: Payer: Self-pay

## 2024-05-30 ENCOUNTER — Ambulatory Visit (INDEPENDENT_AMBULATORY_CARE_PROVIDER_SITE_OTHER): Admitting: Psychiatry

## 2024-05-30 DIAGNOSIS — F411 Generalized anxiety disorder: Secondary | ICD-10-CM

## 2024-05-30 NOTE — Progress Notes (Signed)
 [facility] Counselor/Therapist Progress Note  Patient ID: Shannon Swanson, MRN: [id number],    Date: 05/30/2024  Time Spent: 50 minutes start time 2:12 PM end time 3:02 PM  Treatment Type: Individual Therapy  Reported Symptoms: anxiety, triggered responses, panic, pain issues, muscle tension, sadness  Mental Status Exam:  Appearance:   Well Groomed     Behavior:  Appropriate  Motor:  Normal  Speech/Language:   Normal Rate  Affect:  Appropriate  Mood:  anxious  Thought process:  circumstantial  Thought content:    WNL  Sensory/Perceptual disturbances:    pain  Orientation:  oriented to person, place, time/date, and situation  Attention:  Good  Concentration:  Good  Memory:  WNL  Fund of knowledge:   Good  Insight:    Good  Judgment:   Good  Impulse Control:  Good   Risk Assessment: Danger to Self:  No Self-injurious Behavior: No Danger to Others: No Duty to Warn:no Physical Aggression / Violence:No  Access to Firearms a concern: No  Gang Involvement:No   Subjective: Patient was present for session. She shared the last 2 days have been good due to spending time with her sister. She went on to share that a few days ago her son screamed when he let the cat in accidentally and she was panicked after that all day. She went on to share that there was another incident when she was scared due to a mouse running at her. She shared that her sister was with her so she was able to help her ground herself faster.  Patient was encouraged to think through the situations and what she had found helpful and how she can use that information to ground herself more quickly.  Patient was able to see that she does better when she is around other people.  Patient shared that her son has a German Shepherd that lives with them.  Discussed possibly working on spending more time with the dog to see if she can help calm patient.  Patient stated the dog has already noticed when she gets anxious  and she is feeling her presents in those moments.  Patient is also having to take her mother to a lot of doctors appointments which gets her out of the house but is keeping her from being able to do some things at home that are getting overwhelming.  Patient stated that currently even going through the mail is more than she can handle.  Discussed opening 1 piece of mail each time she takes her Ritalin  since that is when she is most focused just to see if things can start getting less overwhelming.  Interventions: Solution-Oriented/Positive Psychology  Diagnosis:   ICD-10-CM   1. Generalized anxiety disorder  F41.1       Plan:  Patient is to use CBT and coping skills to decrease triggered responses.  Patient is to follow plans from session to try and take care of 1 piece and mail it each time she takes her Ritalin .  Patient is also to consider seeing if her son's dog could be trained as an emotional support animal for her.  Patient is to work on journaling symptoms feelings and triggers.   Patient is to work on thinking of positives and affirming herself.  Patient is to look at podcast by Dr. Aleck favorite on neuro cycle changes.  Patient is to work on reminding herself that she is enough and still has a purpose even when she  is unable to work.  She is to try and focus on concrete things that she can still do.  Patient is to continue working with providers on her medical issues.  Patient is to take medication as directed   Silvano Pacini, LCMHC

## 2024-06-03 ENCOUNTER — Other Ambulatory Visit (HOSPITAL_COMMUNITY): Payer: Self-pay

## 2024-06-13 DIAGNOSIS — Z1211 Encounter for screening for malignant neoplasm of colon: Secondary | ICD-10-CM | POA: Diagnosis not present

## 2024-06-20 LAB — COLOGUARD: COLOGUARD: NEGATIVE

## 2024-06-26 ENCOUNTER — Ambulatory Visit: Admitting: Psychiatry

## 2024-06-26 ENCOUNTER — Encounter: Payer: Self-pay | Admitting: Psychiatry

## 2024-06-26 DIAGNOSIS — F411 Generalized anxiety disorder: Secondary | ICD-10-CM

## 2024-06-26 NOTE — Progress Notes (Unsigned)
 [facility] Counselor/Therapist Progress Note  Patient ID: Shannon Swanson, MRN: [id number],    Date: 06/26/2024  Time Spent: 51 minutes start time 4:04 PM end time 4:55 PM  Treatment Type: Individual Therapy  Reported Symptoms: anxiety, depression, low motivation, fatigue,pain issues, focusing  and memory issues, nightmares, sleep issues, flashbacks, hypervigilance, panic   Mental Status Exam:  Appearance:   Well Groomed     Behavior:  Appropriate  Motor:  Normal  Speech/Language:   Normal Rate  Affect:  Appropriate  Mood:  anxious  Thought process:  circumstantial  Thought content:    WNL  Sensory/Perceptual disturbances:    Pain issues  Orientation:  oriented to person, place, time/date, and situation  Attention:  Good  Concentration:  Good  Memory:  Immediate;   Fair  Fund of knowledge:   Good  Insight:    Good  Judgment:   Good  Impulse Control:  Good   Risk Assessment: Danger to Self:  No Self-injurious Behavior: No Danger to Others: No Duty to Warn:no Physical Aggression / Violence:No  Access to Firearms a concern: No  Gang Involvement:No   Subjective: Patient was present for session. She shared that there have been some good days and days that are not as good. She shared that her kids will invite her to come eat but she won't go over  when it is dark. She shared she ends up sitting by herself rather than going anywhere.  She has had stuff to go to a thrift store for weeks and has not been able to do it. Had her think through what her thoughts were on the situation that are keeping her stuck and thought through ways to talk herself through making steps towards the right direction.  Patient was able to recognize that she struggles with applying her skills in those situations.  Discussed the importance of start implementing paced breathing on a regular basis so that it becomes more automatic.  Reminded patient of DBT skill ST OP and importance of staying grounded  on the facts/truths in situations.  Patient was also encouraged to think through different ways that she can do things and yes feels safe and have some structure so that if she needs to remove herself that she can.  Patient was able to come up with some plans that she felt would be effective for her.  Interventions: Cognitive Behavioral Therapy, Dialectical Behavioral Therapy, and Solution-Oriented/Positive Psychology  Diagnosis:   ICD-10-CM   1. Generalized anxiety disorder  F41.1       Plan:  Patient is to use CBT, DBT, and coping skills to decrease triggered responses.  Patient is to follow plans from session to work on using her skills more regularly and finding ways to structure different events that she may want to attend so that she can feel safe.  Patient is also to consider seeing if her son's dog could be trained as an emotional support animal for her.  Patient is to work on journaling symptoms feelings and triggers.   Patient is to work on thinking of positives and affirming herself.  Patient is to look at podcast by Dr. Aleck favorite on neuro cycle changes.  Patient is to work on reminding herself that she is enough and still has a purpose even when she is unable to work.  She is to try and focus on concrete things that she can still do.  Patient is to continue working with providers on her medical  issues.  Patient is to take medication as directed   Silvano Pacini, LCMHC

## 2024-07-10 ENCOUNTER — Other Ambulatory Visit (HOSPITAL_COMMUNITY): Payer: Self-pay

## 2024-07-12 ENCOUNTER — Other Ambulatory Visit (HOSPITAL_COMMUNITY): Payer: Self-pay

## 2024-07-20 ENCOUNTER — Other Ambulatory Visit (HOSPITAL_COMMUNITY): Payer: Self-pay

## 2024-08-16 ENCOUNTER — Other Ambulatory Visit (HOSPITAL_COMMUNITY): Payer: Self-pay

## 2024-08-16 ENCOUNTER — Other Ambulatory Visit: Payer: Self-pay

## 2024-08-16 MED ORDER — DULOXETINE HCL 20 MG PO CSDR
20.0000 mg | DELAYED_RELEASE_CAPSULE | Freq: Every morning | ORAL | 1 refills | Status: AC
  Filled 2024-08-16: qty 30, 30d supply, fill #0

## 2024-08-16 MED ORDER — METHYLPHENIDATE HCL 5 MG PO TABS
5.0000 mg | ORAL_TABLET | Freq: Three times a day (TID) | ORAL | 0 refills | Status: AC
  Filled 2024-08-16: qty 90, 30d supply, fill #0
